# Patient Record
Sex: Female | Born: 1990
Health system: Southern US, Community
[De-identification: ages and names within clinical notes are randomized; demographics above are authoritative.]

## PROBLEM LIST (undated history)

## (undated) ENCOUNTER — Emergency Department (HOSPITAL_COMMUNITY): Admission: EM | Payer: MEDICAID | Source: Home / Self Care

## (undated) DIAGNOSIS — F410 Panic disorder [episodic paroxysmal anxiety] without agoraphobia: Secondary | ICD-10-CM

## (undated) DIAGNOSIS — F1110 Opioid abuse, uncomplicated: Secondary | ICD-10-CM

## (undated) DIAGNOSIS — K219 Gastro-esophageal reflux disease without esophagitis: Secondary | ICD-10-CM

## (undated) DIAGNOSIS — F429 Obsessive-compulsive disorder, unspecified: Secondary | ICD-10-CM

## (undated) DIAGNOSIS — K589 Irritable bowel syndrome without diarrhea: Secondary | ICD-10-CM

## (undated) DIAGNOSIS — Z8619 Personal history of other infectious and parasitic diseases: Secondary | ICD-10-CM

## (undated) DIAGNOSIS — K649 Unspecified hemorrhoids: Secondary | ICD-10-CM

## (undated) DIAGNOSIS — A749 Chlamydial infection, unspecified: Secondary | ICD-10-CM

## (undated) DIAGNOSIS — F319 Bipolar disorder, unspecified: Secondary | ICD-10-CM

## (undated) DIAGNOSIS — F4310 Post-traumatic stress disorder, unspecified: Secondary | ICD-10-CM

## (undated) DIAGNOSIS — F411 Generalized anxiety disorder: Secondary | ICD-10-CM

## (undated) DIAGNOSIS — F419 Anxiety disorder, unspecified: Secondary | ICD-10-CM

## (undated) DIAGNOSIS — G8929 Other chronic pain: Secondary | ICD-10-CM

## (undated) DIAGNOSIS — R102 Pelvic and perineal pain unspecified side: Secondary | ICD-10-CM

## (undated) DIAGNOSIS — F603 Borderline personality disorder: Secondary | ICD-10-CM

## (undated) DIAGNOSIS — F32A Depression, unspecified: Secondary | ICD-10-CM

## (undated) DIAGNOSIS — Z9151 Personal history of suicidal behavior: Secondary | ICD-10-CM

## (undated) DIAGNOSIS — K602 Anal fissure, unspecified: Secondary | ICD-10-CM

## (undated) DIAGNOSIS — K209 Esophagitis, unspecified without bleeding: Secondary | ICD-10-CM

## (undated) DIAGNOSIS — Z8659 Personal history of other mental and behavioral disorders: Secondary | ICD-10-CM

## (undated) DIAGNOSIS — F1910 Other psychoactive substance abuse, uncomplicated: Secondary | ICD-10-CM

## (undated) DIAGNOSIS — N809 Endometriosis, unspecified: Secondary | ICD-10-CM

## (undated) DIAGNOSIS — I10 Essential (primary) hypertension: Secondary | ICD-10-CM

## (undated) HISTORY — DX: Esophagitis, unspecified without bleeding: K20.90

## (undated) HISTORY — PX: FOOT FRACTURE SURGERY: SHX645

## (undated) HISTORY — DX: Bipolar disorder, unspecified: F31.9

## (undated) HISTORY — DX: Anal fissure, unspecified: K60.2

---
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
## 2009-02-19 ENCOUNTER — Emergency Department (HOSPITAL_COMMUNITY): Admission: EM | Admit: 2009-02-19 | Discharge: 2009-02-19 | Payer: Self-pay | Admitting: Emergency Medicine

## 2009-03-23 ENCOUNTER — Emergency Department (HOSPITAL_COMMUNITY): Admission: EM | Admit: 2009-03-23 | Discharge: 2009-03-23 | Payer: Self-pay | Admitting: Emergency Medicine

## 2009-11-28 DIAGNOSIS — K2950 Unspecified chronic gastritis without bleeding: Secondary | ICD-10-CM

## 2009-11-28 HISTORY — PX: UPPER GASTROINTESTINAL ENDOSCOPY: SHX188

## 2009-11-28 HISTORY — DX: Unspecified chronic gastritis without bleeding: K29.50

## 2010-07-10 ENCOUNTER — Emergency Department (HOSPITAL_COMMUNITY): Admission: EM | Admit: 2010-07-10 | Discharge: 2010-07-10 | Payer: Self-pay | Admitting: Emergency Medicine

## 2010-07-10 IMAGING — CT CT ABD-PELV W/ CM
2 of 4 series · 17 of 46 positions shown, 19 images · IV contrast (agent unspecified)
Comparison: [DATE]

CLINICAL DATA: Abdominal pain.  Vomiting.  Right lower quadrant
pain.  Question of appendicitis.

CT ABDOMEN AND PELVIS WITH CONTRAST
TECHNIQUE: Multidetector CT imaging of the abdomen and pelvis was
performed following the standard protocol during bolus
administration of intravenous contrast.
Contrast: 125 ml [DB]

[Series 2: abd_pel 5.0 b40f st · axial · 0.60mm/px · z∈[-524,-84]mm · 14 of 96 slices shown, 16 images]
[im 4/96  soft-tissue]
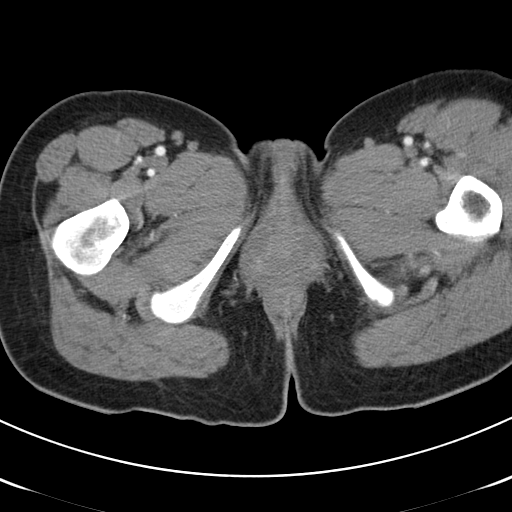
[im 4/96  bone]
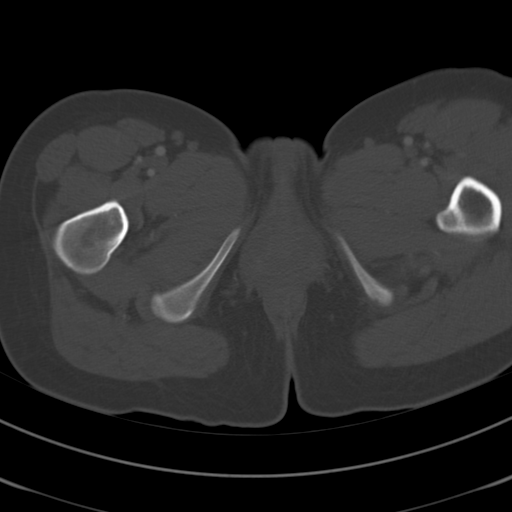
[im 12/96  soft-tissue]
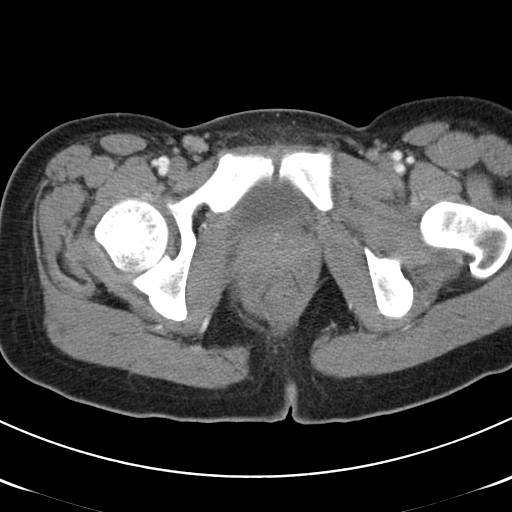
[im 20/96  soft-tissue]
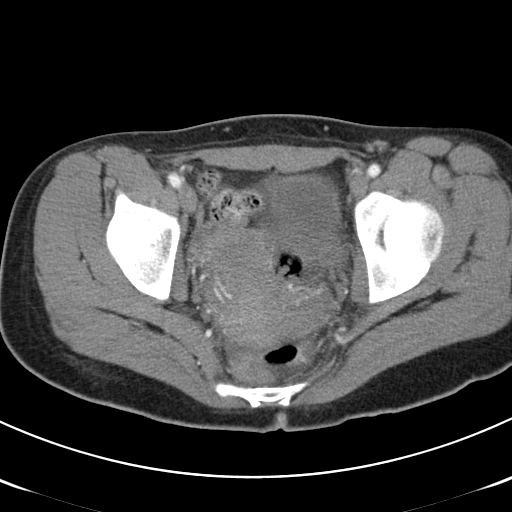
[im 24/96  soft-tissue]
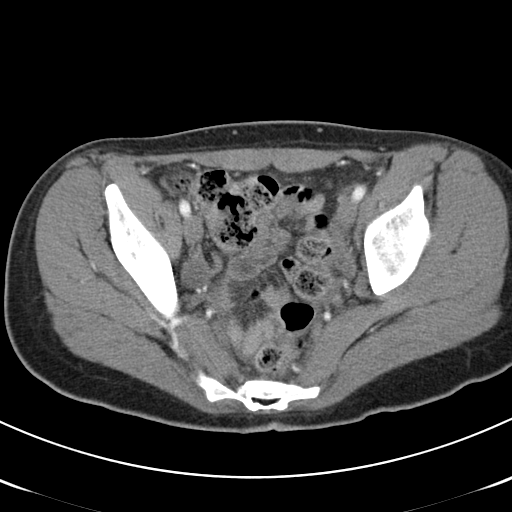
[im 32/96  soft-tissue]
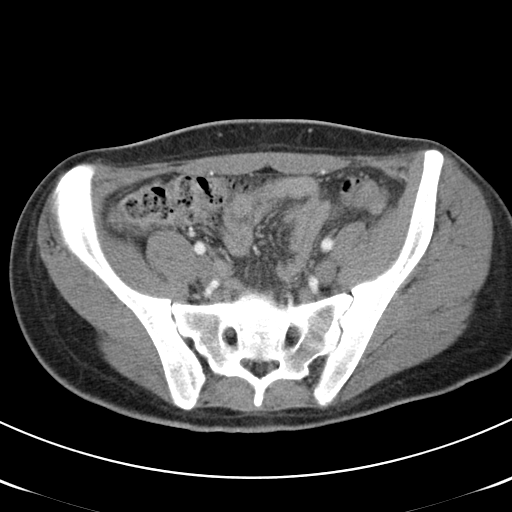
[im 40/96  soft-tissue]
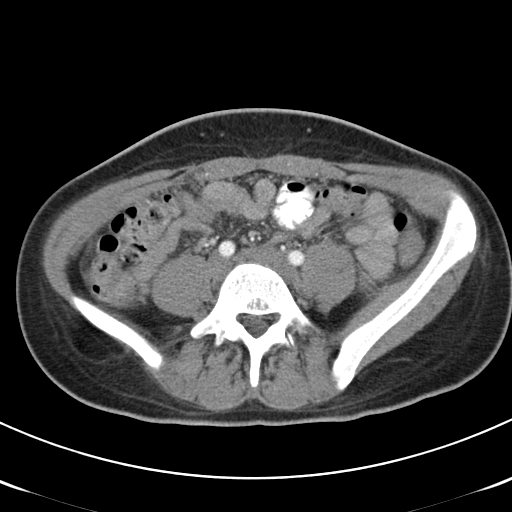
[im 44/96  soft-tissue]
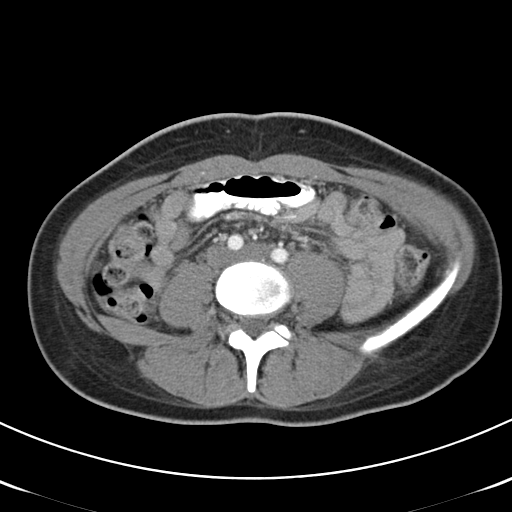
[im 52/96  soft-tissue]
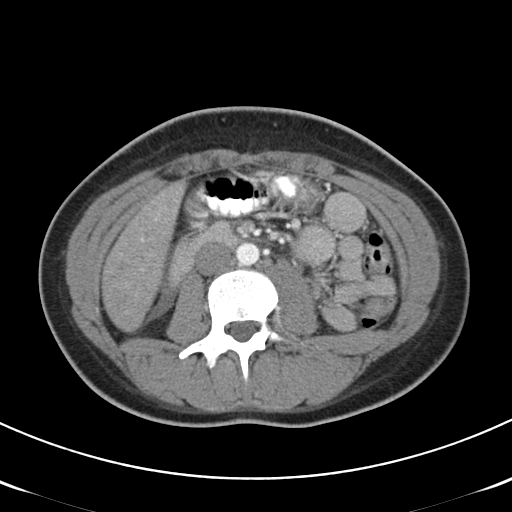
[im 56/96  soft-tissue]
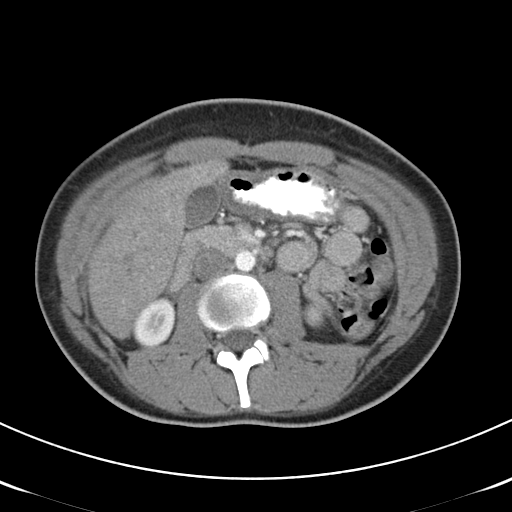
[im 56/96  bone]
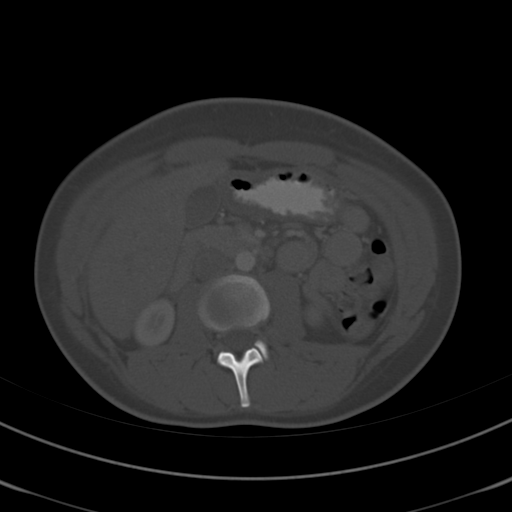
[im 64/96  soft-tissue]
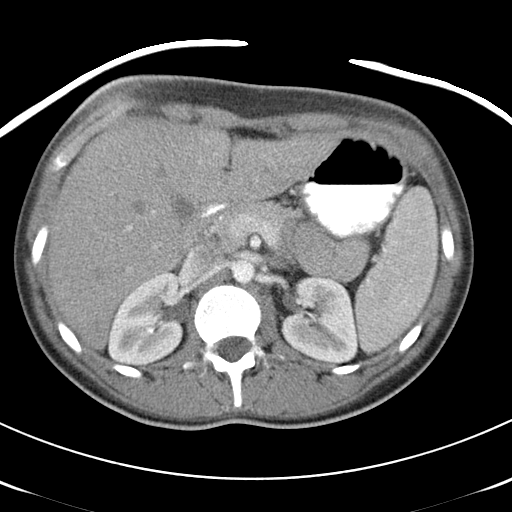
[im 72/96  soft-tissue]
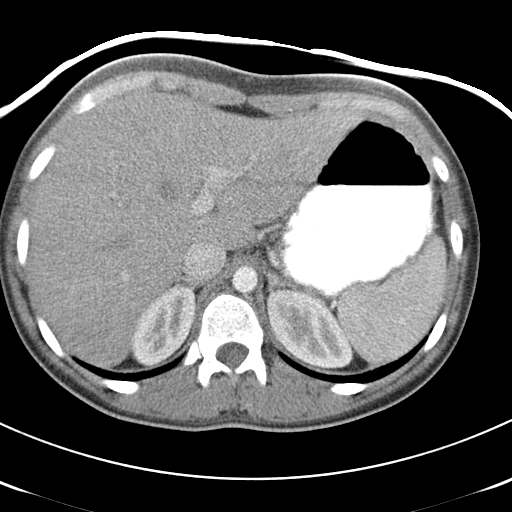
[im 76/96  soft-tissue]
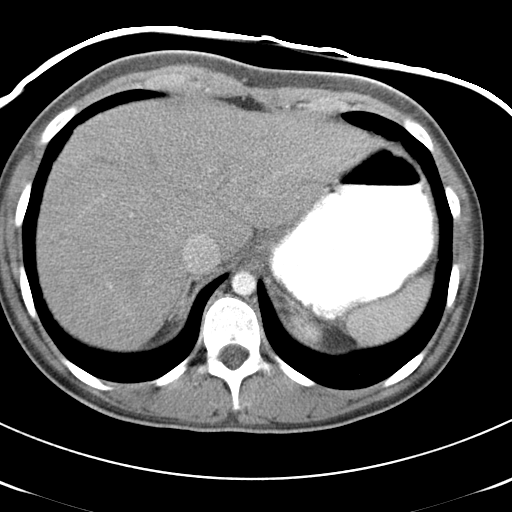
[im 84/96  soft-tissue]
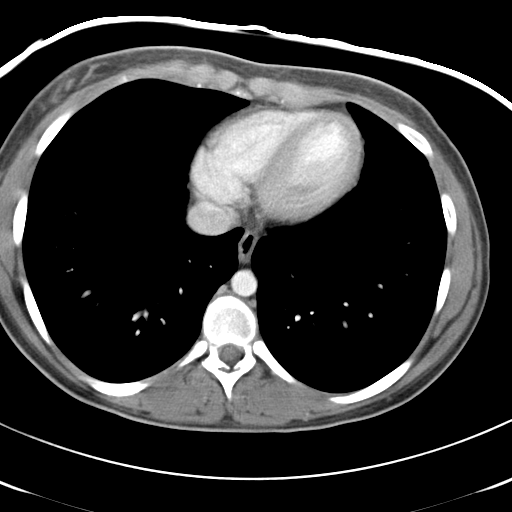
[im 92/96  soft-tissue]
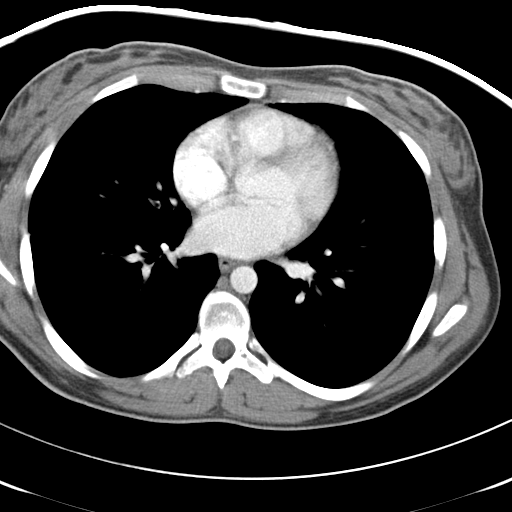

[Series 602: coronal · coronal · 0.97mm/px · 3 of 70 slices shown]
[im 24/70  soft-tissue]
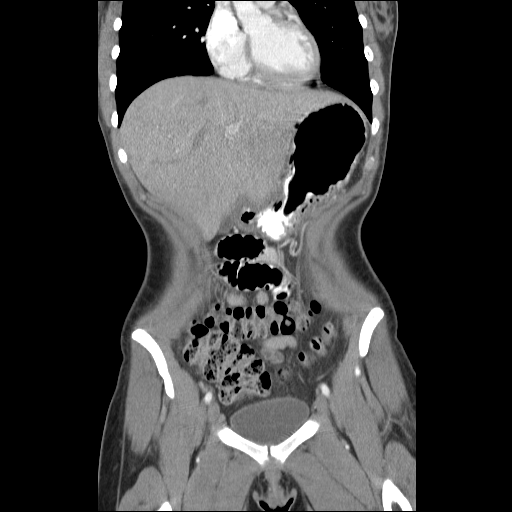
[im 31/70  soft-tissue]
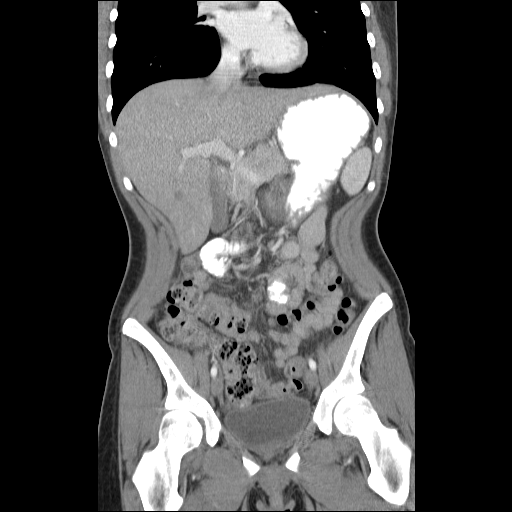
[im 39/70  soft-tissue]
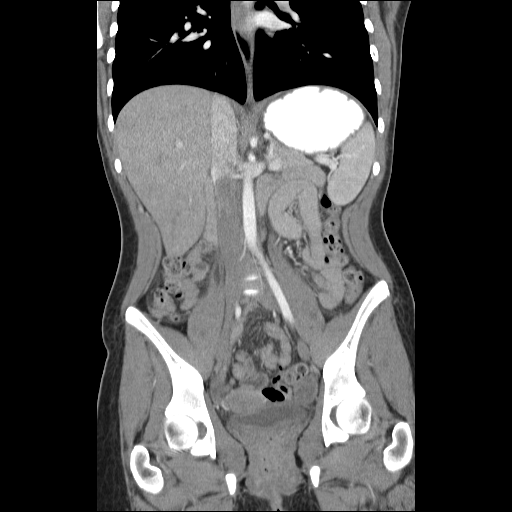

[17 of 46 positions shown; findings below may reference images not displayed]

FINDINGS: Within the right upper quadrant, there is a small amount
of retroperitoneal fluid.  This measures a low attenuation and is
seen posterior to the pancreatic head and duodenum, anterior to the
kidney.  There is a small amount stranding surrounding the
ascending colon.  No retroperitoneal gas identified.  The appendix
is well seen and has a normal appearance. No focal abnormalities
identified within the liver, spleen, pancreas, adrenal glands, or
kidneys.  The gallbladder is present.  No evidence for bowel
obstruction or bowel wall thickening.

The uterus is present.  There is a small amount free pelvic fluid.
No evidence for adnexal mass.  Bony structures have a normal
appearance.
IMPRESSION: 1.  Nonspecific retroperitoneal fluid within the right upper
quadrant.  Etiologies include the pancreas, duodenum, or ascending
colon.
2.  No evidence for acute appendicitis.

## 2010-10-17 ENCOUNTER — Emergency Department (HOSPITAL_BASED_OUTPATIENT_CLINIC_OR_DEPARTMENT_OTHER): Admission: EM | Admit: 2010-10-17 | Discharge: 2010-10-17 | Payer: Self-pay | Admitting: Emergency Medicine

## 2010-11-28 HISTORY — PX: UPPER GASTROINTESTINAL ENDOSCOPY: SHX188

## 2010-12-08 ENCOUNTER — Emergency Department (HOSPITAL_COMMUNITY)
Admission: EM | Admit: 2010-12-08 | Discharge: 2010-12-08 | Payer: Self-pay | Source: Home / Self Care | Admitting: Emergency Medicine
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
## 2010-12-09 ENCOUNTER — Emergency Department (HOSPITAL_COMMUNITY)
Admission: EM | Admit: 2010-12-09 | Discharge: 2010-12-09 | Payer: Self-pay | Source: Home / Self Care | Admitting: Emergency Medicine

## 2010-12-09 ENCOUNTER — Emergency Department (HOSPITAL_BASED_OUTPATIENT_CLINIC_OR_DEPARTMENT_OTHER)
Admission: EM | Admit: 2010-12-09 | Discharge: 2010-12-10 | Disposition: A | Discharge status: Other Acute Inpatient Hospital | Payer: Self-pay | Source: Home / Self Care | Admitting: Emergency Medicine

## 2010-12-09 IMAGING — CT CT ABD-PELV W/ CM
2 of 4 series · 17 of 46 positions shown, 19 images · IV contrast (APPLIED)
Comparison: Radiographs obtained yesterday.  CT dated [DATE].

CLINICAL DATA: Mid abdominal pain, nausea and vomiting.

CT ABDOMEN AND PELVIS WITH CONTRAST
TECHNIQUE: Multidetector CT imaging of the abdomen and pelvis was
performed following the standard protocol during bolus
administration of intravenous contrast.
Contrast: 100 ml [U2]

[Series 2: abd/pelvis 5.0 b31f · axial · 0.64mm/px · z∈[+965,+1345]mm · 14 of 84 slices shown, 16 images]
[im 4/84  soft-tissue]
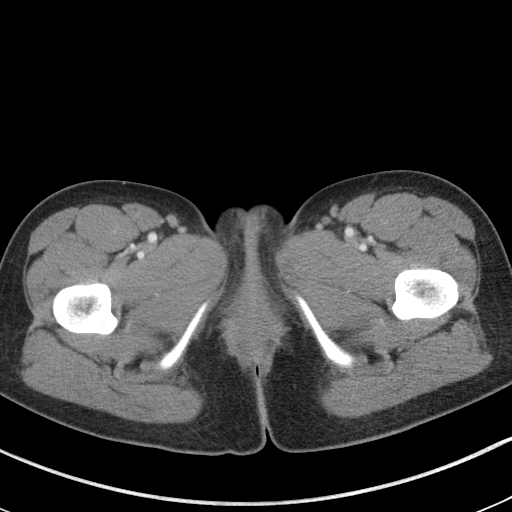
[im 4/84  bone]
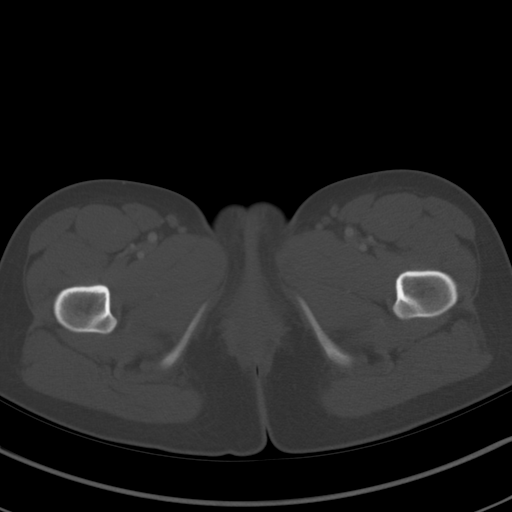
[im 11/84  soft-tissue]
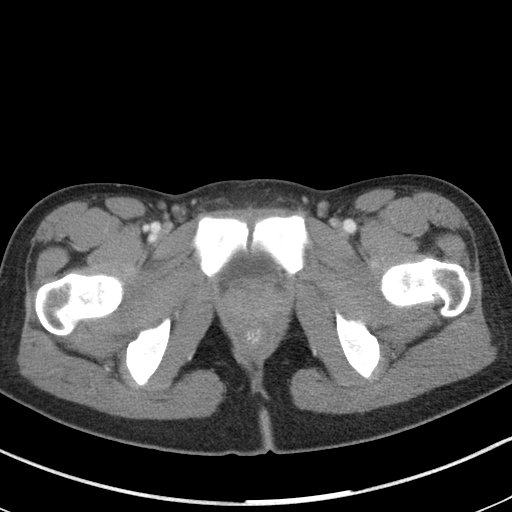
[im 15/84  soft-tissue]
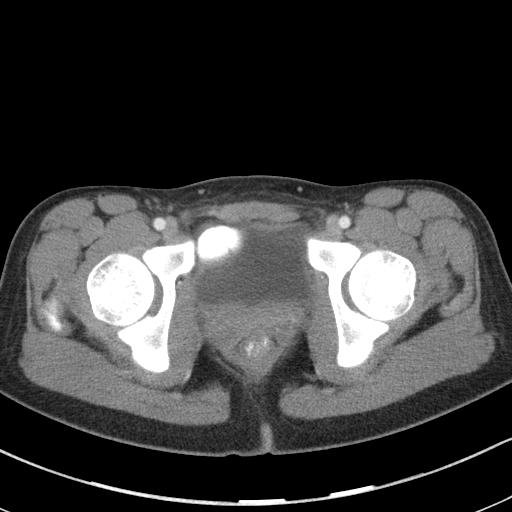
[im 22/84  soft-tissue]
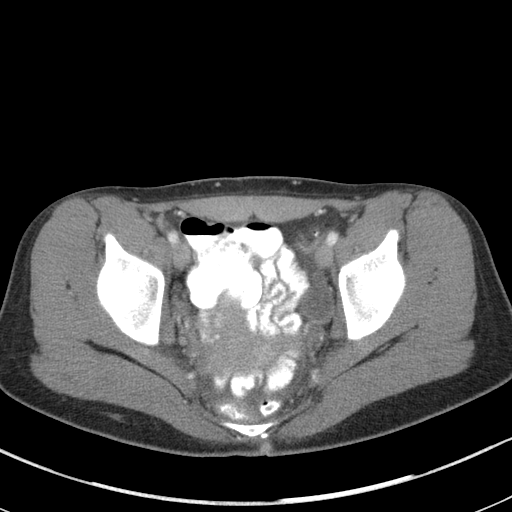
[im 29/84  soft-tissue]
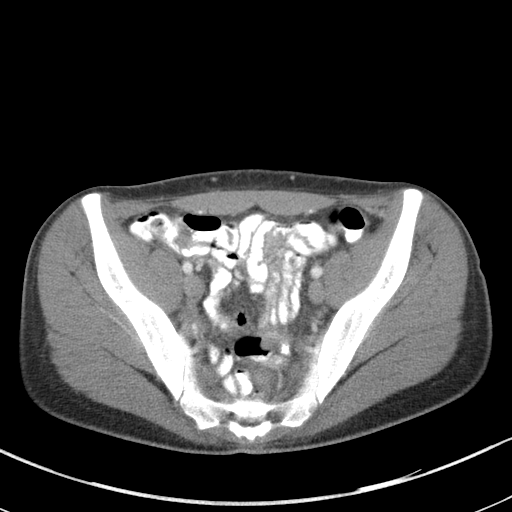
[im 33/84  soft-tissue]
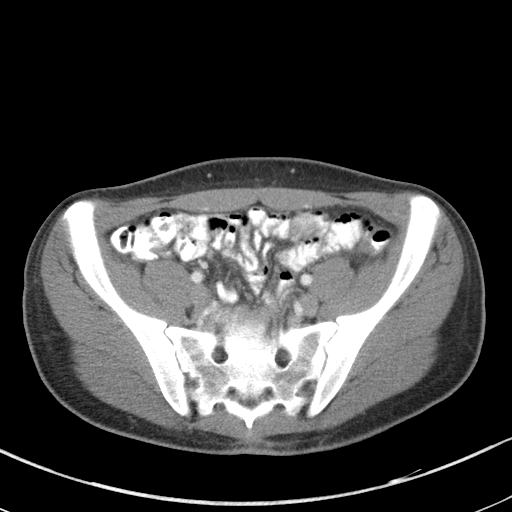
[im 40/84  soft-tissue]
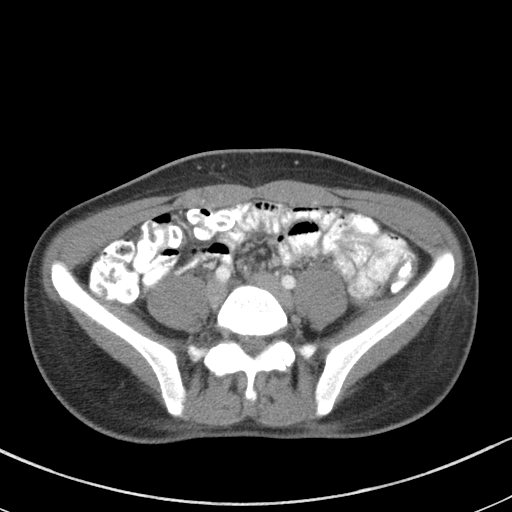
[im 44/84  soft-tissue]
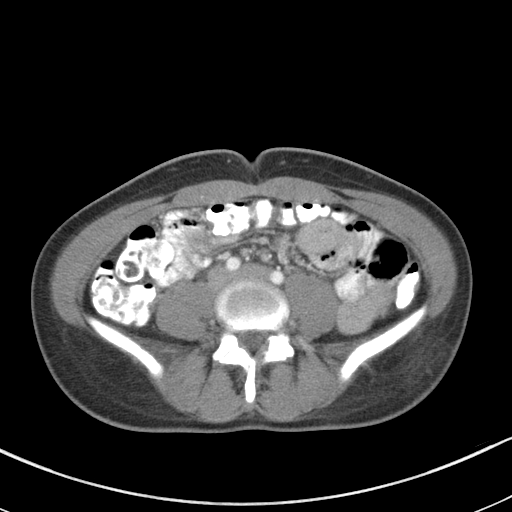
[im 51/84  soft-tissue]
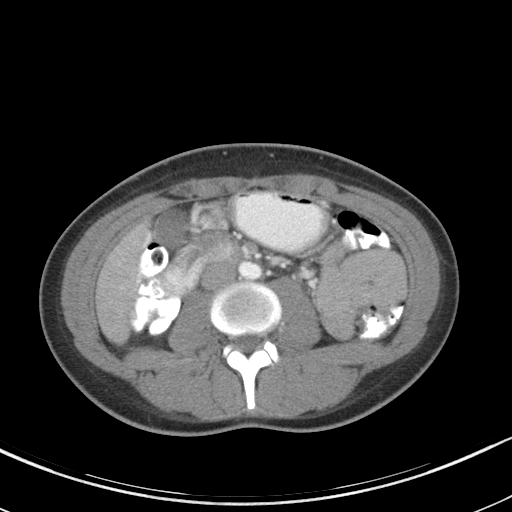
[im 51/84  bone]
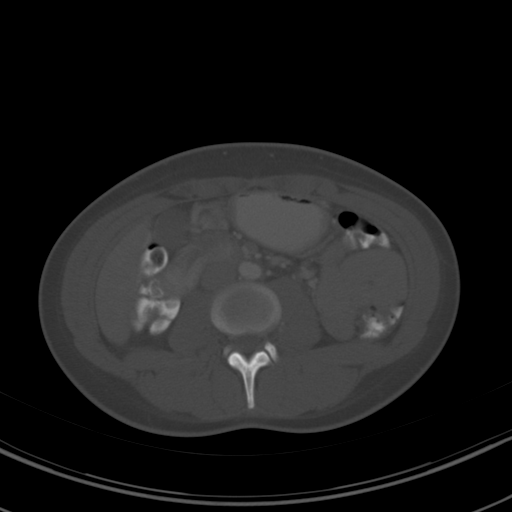
[im 55/84  soft-tissue]
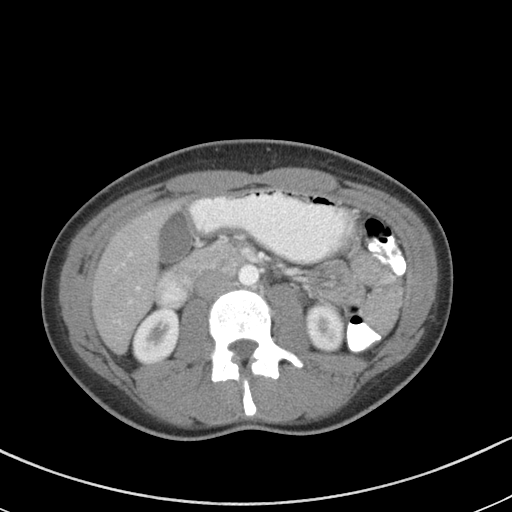
[im 62/84  soft-tissue]
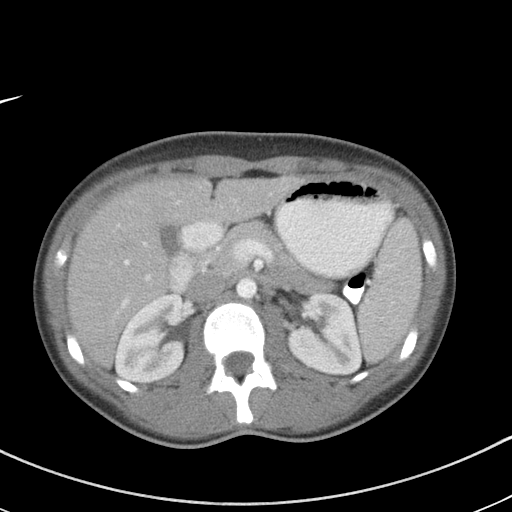
[im 69/84  soft-tissue]
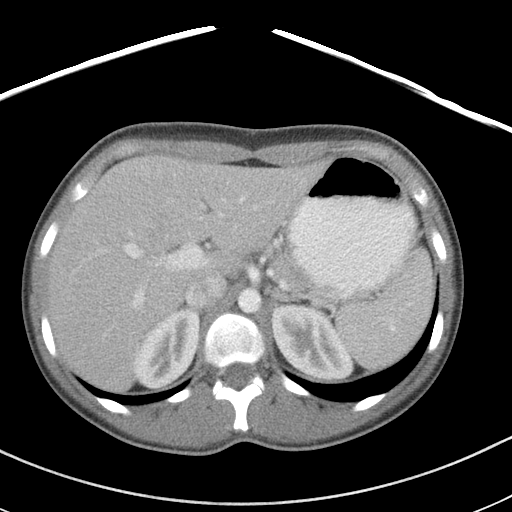
[im 73/84  soft-tissue]
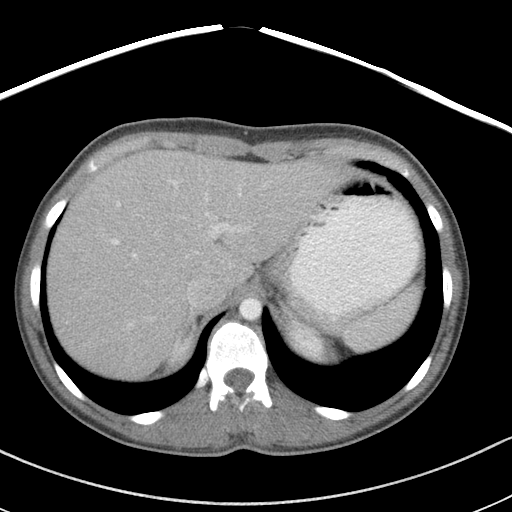
[im 80/84  soft-tissue]
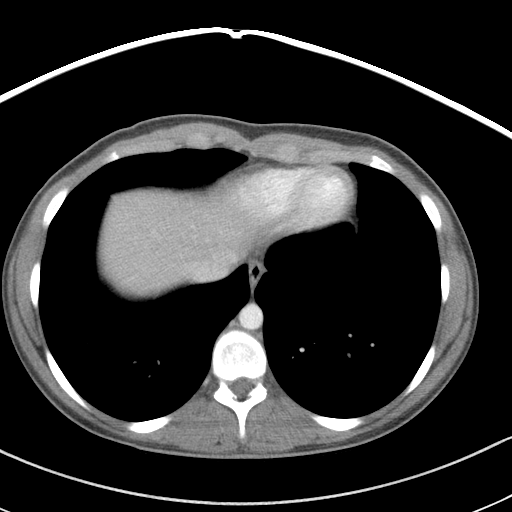

[Series 5: abd/pelvis 3.0 coronal · coronal · 0.69mm/px · 3 of 72 slices shown]
[im 24/72  soft-tissue]
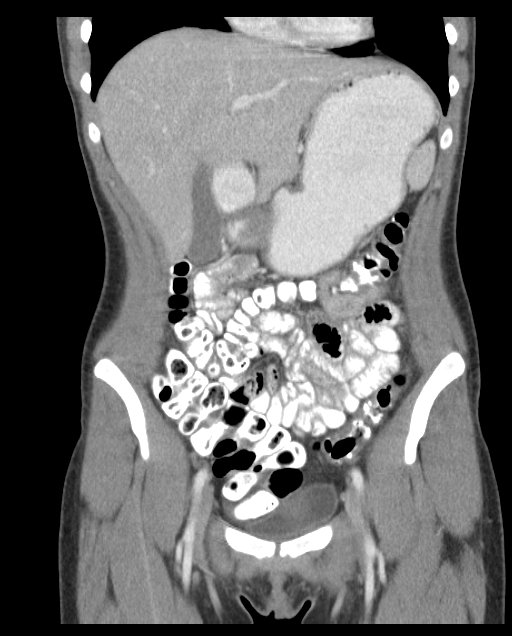
[im 32/72  soft-tissue]
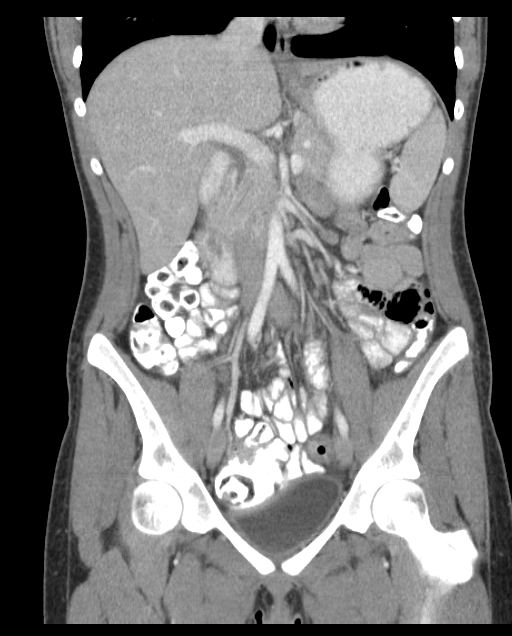
[im 40/72  soft-tissue]
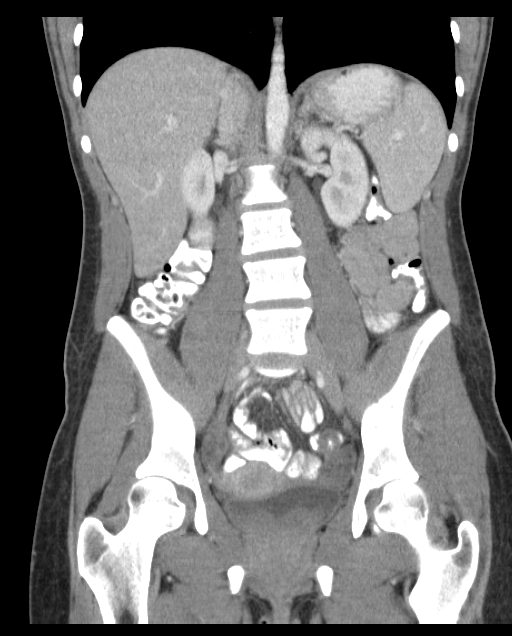

[17 of 46 positions shown; findings below may reference images not displayed]

FINDINGS: Unremarkable liver, spleen, pancreas, gallbladder,
adrenal glands, kidneys, urinary bladder, uterus and ovaries.  No
gastrointestinal abnormalities or enlarged lymph nodes.  Minimal
free peritoneal fluid, within normal limits of physiological fluid.
No evidence of appendicitis.  Clear lung bases.  Mild scoliosis.
Stable small lytic and sclerotic lesion in the right iliac bone,
with nonaggressive features.
IMPRESSION: No acute abnormality.

## 2010-12-10 ENCOUNTER — Observation Stay (HOSPITAL_COMMUNITY)
Admission: EM | Admit: 2010-12-10 | Discharge: 2010-12-11 | Payer: Self-pay | Attending: Internal Medicine | Admitting: Internal Medicine

## 2010-12-10 DIAGNOSIS — F4310 Post-traumatic stress disorder, unspecified: Secondary | ICD-10-CM
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
## 2010-12-13 ENCOUNTER — Emergency Department (HOSPITAL_COMMUNITY)
Admission: EM | Admit: 2010-12-13 | Discharge: 2010-12-13 | Payer: Self-pay | Source: Home / Self Care | Admitting: Emergency Medicine

## 2010-12-13 LAB — CBC
HCT: 46.7 % — ABNORMAL HIGH (ref 36.0–46.0)
HCT: 40.8 % (ref 36.0–46.0)
Hemoglobin: 15.8 g/dL — ABNORMAL HIGH (ref 12.0–15.0)
Hemoglobin: 14.1 g/dL (ref 12.0–15.0)
MCH: 31.2 pg (ref 26.0–34.0)
MCH: 29.9 pg (ref 26.0–34.0)
MCHC: 33.8 g/dL (ref 30.0–36.0)
MCHC: 34.6 g/dL (ref 30.0–36.0)
MCV: 92.1 fL (ref 78.0–100.0)
MCV: 86.4 fL (ref 78.0–100.0)
Platelets: 339 10*3/uL (ref 150–400)
Platelets: 283 10*3/uL (ref 150–400)
RBC: 5.07 MIL/uL (ref 3.87–5.11)
RBC: 4.72 MIL/uL (ref 3.87–5.11)
RDW: 12.6 % (ref 11.5–15.5)
RDW: 12.7 % (ref 11.5–15.5)
WBC: 14.9 10*3/uL — ABNORMAL HIGH (ref 4.0–10.5)
WBC: 10.5 10*3/uL (ref 4.0–10.5)

## 2010-12-13 LAB — URINALYSIS, ROUTINE W REFLEX MICROSCOPIC
Bilirubin Urine: NEGATIVE
Hgb urine dipstick: NEGATIVE
Ketones, ur: 15 mg/dL — AB
Ketones, ur: 15 mg/dL — AB
Leukocytes, UA: NEGATIVE
Nitrite: POSITIVE — AB
Nitrite: NEGATIVE
Protein, ur: 100 mg/dL — AB
Protein, ur: NEGATIVE mg/dL
Specific Gravity, Urine: 1.029 (ref 1.005–1.030)
Specific Gravity, Urine: 1.021 (ref 1.005–1.030)
Urine Glucose, Fasting: NEGATIVE mg/dL
Urine Glucose, Fasting: NEGATIVE mg/dL
Urobilinogen, UA: 1 mg/dL (ref 0.0–1.0)
Urobilinogen, UA: 0.2 mg/dL (ref 0.0–1.0)
pH: 7.5 (ref 5.0–8.0)
pH: 6 (ref 5.0–8.0)

## 2010-12-13 LAB — COMPREHENSIVE METABOLIC PANEL
ALT: 16 U/L (ref 0–35)
AST: 23 U/L (ref 0–37)
Albumin: 4.8 g/dL (ref 3.5–5.2)
Alkaline Phosphatase: 106 U/L (ref 39–117)
BUN: 9 mg/dL (ref 6–23)
CO2: 27 mEq/L (ref 19–32)
Calcium: 10.4 mg/dL (ref 8.4–10.5)
Chloride: 104 mEq/L (ref 96–112)
Creatinine, Ser: 0.89 mg/dL (ref 0.4–1.2)
GFR calc Af Amer: >60 mL/min (ref >60)
GFR calc non Af Amer: >60 mL/min (ref >60)
Glucose, Bld: 121 mg/dL — ABNORMAL HIGH (ref 70–99)
Potassium: 4.6 mEq/L (ref 3.5–5.1)
Sodium: 141 mEq/L (ref 135–145)
Total Bilirubin: 0.4 mg/dL (ref 0.3–1.2)
Total Protein: 8.6 g/dL — ABNORMAL HIGH (ref 6.0–8.3)

## 2010-12-13 LAB — BASIC METABOLIC PANEL
BUN: 9 mg/dL (ref 6–23)
CO2: 22 mEq/L (ref 19–32)
Calcium: 9.9 mg/dL (ref 8.4–10.5)
Chloride: 108 mEq/L (ref 96–112)
Creatinine, Ser: 0.8 mg/dL (ref 0.4–1.2)
GFR calc Af Amer: >60 mL/min (ref >60)
GFR calc non Af Amer: >60 mL/min (ref >60)
Glucose, Bld: 113 mg/dL — ABNORMAL HIGH (ref 70–99)
Potassium: 3.9 mEq/L (ref 3.5–5.1)
Sodium: 146 mEq/L — ABNORMAL HIGH (ref 135–145)

## 2010-12-13 LAB — DRUGS OF ABUSE SCREEN W/O ALC, ROUTINE URINE
Amphetamine Screen, Ur: NEGATIVE
Barbiturate Quant, Ur: NEGATIVE
Benzodiazepines.: NEGATIVE
Cocaine Metabolites: NEGATIVE
Creatinine,U: 56.7 mg/dL
Marijuana Metabolite: POSITIVE — AB
Methadone: NEGATIVE
Opiate Screen, Urine: NEGATIVE
Phencyclidine (PCP): NEGATIVE
Propoxyphene: NEGATIVE

## 2010-12-13 LAB — URINE MICROSCOPIC-ADD ON

## 2010-12-13 LAB — DIFFERENTIAL
Basophils Absolute: 0 10*3/uL (ref 0.0–0.1)
Basophils Absolute: 0 10*3/uL (ref 0.0–0.1)
Basophils Relative: 0 % (ref 0–1)
Basophils Relative: 0 % (ref 0–1)
Eosinophils Absolute: 0 10*3/uL (ref 0.0–0.7)
Eosinophils Absolute: 0 10*3/uL (ref 0.0–0.7)
Eosinophils Relative: 0 % (ref 0–5)
Eosinophils Relative: 0 % (ref 0–5)
Lymphocytes Relative: 11 % — ABNORMAL LOW (ref 12–46)
Lymphocytes Relative: 18 % (ref 12–46)
Lymphs Abs: 1.6 10*3/uL (ref 0.7–4.0)
Lymphs Abs: 1.9 10*3/uL (ref 0.7–4.0)
Monocytes Absolute: 0.5 10*3/uL (ref 0.1–1.0)
Monocytes Absolute: 0.4 10*3/uL (ref 0.1–1.0)
Monocytes Relative: 3 % (ref 3–12)
Monocytes Relative: 4 % (ref 3–12)
Neutro Abs: 12.8 10*3/uL — ABNORMAL HIGH (ref 1.7–7.7)
Neutro Abs: 8.1 10*3/uL — ABNORMAL HIGH (ref 1.7–7.7)
Neutrophils Relative %: 86 % — ABNORMAL HIGH (ref 43–77)
Neutrophils Relative %: 78 % — ABNORMAL HIGH (ref 43–77)

## 2010-12-13 LAB — PREGNANCY, URINE: Preg Test, Ur: NEGATIVE

## 2010-12-13 LAB — URINE CULTURE
Colony Count: NO GROWTH
Culture  Setup Time: 201201120131
Culture: NO GROWTH

## 2010-12-13 LAB — HIV ANTIBODY (ROUTINE TESTING W REFLEX): HIV: NONREACTIVE

## 2010-12-13 LAB — HEPATIC FUNCTION PANEL
ALT: 15 U/L (ref 0–35)
AST: 21 U/L (ref 0–37)
Albumin: 3.5 g/dL (ref 3.5–5.2)
Alkaline Phosphatase: 81 U/L (ref 39–117)
Bilirubin, Direct: <0.1 mg/dL (ref 0.0–0.3)
Total Bilirubin: 0.5 mg/dL (ref 0.3–1.2)
Total Protein: 6.1 g/dL (ref 6.0–8.3)

## 2010-12-13 LAB — TSH: TSH: 3.572 u[IU]/mL (ref 0.350–4.500)

## 2010-12-13 LAB — POCT PREGNANCY, URINE: Preg Test, Ur: NEGATIVE

## 2010-12-13 LAB — VITAMIN B12: Vitamin B-12: 211 pg/mL (ref 211–911)

## 2010-12-13 LAB — LIPASE, BLOOD
Lipase: 18 U/L (ref 11–59)
Lipase: 44 U/L (ref 23–300)

## 2010-12-15 LAB — POCT I-STAT, CHEM 8
BUN: 6 mg/dL (ref 6–23)
Calcium, Ion: 1.05 mmol/L — ABNORMAL LOW (ref 1.12–1.32)
Chloride: 104 mEq/L (ref 96–112)
Creatinine, Ser: 1 mg/dL (ref 0.4–1.2)
Glucose, Bld: 106 mg/dL — ABNORMAL HIGH (ref 70–99)
HCT: 45 % (ref 36.0–46.0)
Hemoglobin: 15.3 g/dL — ABNORMAL HIGH (ref 12.0–15.0)
Potassium: 3.4 mEq/L — ABNORMAL LOW (ref 3.5–5.1)
Sodium: 141 mEq/L (ref 135–145)
TCO2: 29 mmol/L (ref 0–100)

## 2010-12-15 LAB — FOLATE RBC: RBC Folate: 572 ng/mL (ref 180–600)

## 2010-12-20 LAB — THC (MARIJUANA), URINE, CONFIRMATION: Marijuana, Ur-Confirmation: 335 NG/ML — ABNORMAL HIGH

## 2011-02-08 LAB — DIFFERENTIAL
Eosinophils Relative: 0 % (ref 0–5)
Lymphocytes Relative: 14 % (ref 12–46)
Lymphs Abs: 1.6 10*3/uL (ref 0.7–4.0)
Monocytes Absolute: 0.5 10*3/uL (ref 0.1–1.0)
Monocytes Relative: 4 % (ref 3–12)

## 2011-02-08 LAB — COMPREHENSIVE METABOLIC PANEL
AST: 23 U/L (ref 0–37)
Albumin: 4.9 g/dL (ref 3.5–5.2)
Calcium: 10.2 mg/dL (ref 8.4–10.5)
Creatinine, Ser: 0.7 mg/dL (ref 0.4–1.2)
GFR calc Af Amer: >60 mL/min (ref >60)

## 2011-02-08 LAB — CBC
MCH: 31 pg (ref 26.0–34.0)
MCHC: 34.6 g/dL (ref 30.0–36.0)
Platelets: 289 10*3/uL (ref 150–400)
RBC: 4.74 MIL/uL (ref 3.87–5.11)

## 2011-02-08 LAB — URINALYSIS, ROUTINE W REFLEX MICROSCOPIC
Glucose, UA: NEGATIVE mg/dL
Hgb urine dipstick: NEGATIVE
Specific Gravity, Urine: 1.016 (ref 1.005–1.030)
Urobilinogen, UA: 0.2 mg/dL (ref 0.0–1.0)
pH: 8.5 — ABNORMAL HIGH (ref 5.0–8.0)

## 2011-02-08 LAB — POCT TOXICOLOGY PANEL

## 2011-02-11 LAB — URINALYSIS, ROUTINE W REFLEX MICROSCOPIC
Glucose, UA: NEGATIVE mg/dL
Ketones, ur: 40 mg/dL — AB
pH: 8.5 — ABNORMAL HIGH (ref 5.0–8.0)

## 2011-02-11 LAB — COMPREHENSIVE METABOLIC PANEL
ALT: 11 U/L (ref 0–35)
Alkaline Phosphatase: 86 U/L (ref 39–117)
CO2: 22 mEq/L (ref 19–32)
Chloride: 106 mEq/L (ref 96–112)
GFR calc non Af Amer: >60 mL/min (ref >60)
Glucose, Bld: 116 mg/dL — ABNORMAL HIGH (ref 70–99)
Potassium: 3.3 mEq/L — ABNORMAL LOW (ref 3.5–5.1)
Sodium: 141 mEq/L (ref 135–145)
Total Bilirubin: 0.2 mg/dL — ABNORMAL LOW (ref 0.3–1.2)

## 2011-02-11 LAB — DIFFERENTIAL
Basophils Relative: 0 % (ref 0–1)
Eosinophils Absolute: 0.1 10*3/uL (ref 0.0–0.7)
Neutrophils Relative %: 77 % (ref 43–77)

## 2011-02-11 LAB — CBC
HCT: 40 % (ref 36.0–46.0)
Hemoglobin: 14 g/dL (ref 12.0–15.0)
MCHC: 34.9 g/dL (ref 30.0–36.0)
WBC: 8.9 10*3/uL (ref 4.0–10.5)

## 2011-02-11 LAB — POCT PREGNANCY, URINE: Preg Test, Ur: NEGATIVE

## 2011-02-11 LAB — LIPASE, BLOOD: Lipase: 21 U/L (ref 11–59)

## 2011-02-11 LAB — GC/CHLAMYDIA PROBE AMP, GENITAL: Chlamydia, DNA Probe: NEGATIVE

## 2011-03-08 ENCOUNTER — Emergency Department (INDEPENDENT_AMBULATORY_CARE_PROVIDER_SITE_OTHER): Payer: No Typology Code available for payment source

## 2011-03-08 ENCOUNTER — Emergency Department (HOSPITAL_BASED_OUTPATIENT_CLINIC_OR_DEPARTMENT_OTHER)
Admission: EM | Admit: 2011-03-08 | Discharge: 2011-03-08 | Disposition: A | Discharge status: Home / Self Care | Payer: No Typology Code available for payment source | Attending: Emergency Medicine | Admitting: Emergency Medicine

## 2011-03-08 DIAGNOSIS — J45909 Unspecified asthma, uncomplicated: Secondary | ICD-10-CM | POA: Insufficient documentation

## 2011-03-08 DIAGNOSIS — M542 Cervicalgia: Secondary | ICD-10-CM

## 2011-03-08 DIAGNOSIS — S139XXA Sprain of joints and ligaments of unspecified parts of neck, initial encounter: Secondary | ICD-10-CM | POA: Insufficient documentation

## 2011-03-08 DIAGNOSIS — Y92410 Unspecified street and highway as the place of occurrence of the external cause: Secondary | ICD-10-CM | POA: Insufficient documentation
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
## 2011-04-15 NOTE — H&P (Signed)
Gina Dorsey, Gina Dorsey               ACCOUNT NO.:  [id number]   MEDICAL RECORD NO.:  [id number]          PATIENT TYPE:  INP   LOCATION:  0102                          [facility]  [facility]   PHYSICIAN:  Lalla Brothers, MDDATE OF BIRTH:  23-Aug-1991   DATE OF ADMISSION:  02/26/2007  DATE OF DISCHARGE:                       PSYCHIATRIC ADMISSION ASSESSMENT   IDENTIFICATION:  This 85-1/20-year-old female, 10th grade student at  [employer], is admitted emergently voluntarily in transfer  from [facility] Emergency Department where she was sent by  school counselor after school security stopped her attempt to run away.  The patient has suicidal ideation with a plan to overdose, hang herself,  or cut her throat to suicide.  The patient has a history of self-  cutting.  She is residing primarily with father who mother states  provides no boundaries or discipline while the patient fights with  mother when mother provides these such as two months ago.  Most acutely,  mother has intercepted computer communication of the patient in which  she appeared to be selling herself sexually for drugs.   HISTORY OF PRESENT ILLNESS:  The patient enters the hospital very  dysphoric and resigned to suffering while, the next morning, she is  assertive and minimizing her symptoms and consequences.  The patient is  highly defended but seems to have limited interpersonal and intrapsychic  defenses relative to environmental and interpersonal conflict or stress.  The patient cries uncontrollably at times and then gets angry and  demanding.  Mother notes that the patient has sudden switches in her  mood, attitude and cognitive posture.  The patient will seem empathetic  and caring at brief times but then overwhelming subsequently.  The  patient reports hating herself and others.  She reports occasional panic  including on the day of admission.  It is difficult to discern whether  the patient may be  manipulating for certain medication or is directly  sincere about her current status.  Mother confirms the patient's report  that she had stimulant pharmacotherapy including Concerta, Adderall and  Strattera when much younger.  Subsequently, the patient was treated with  Depakote and Zoloft, experiencing a 30-pound weight gain but some  definite improvement.  Mother indicates the patient went into a  [address] and, after months, was released from the camp off of  medications as they attempted to taper and discontinue whatever they  could.  Mother indicates the patient did okay for awhile but then has  relapsed.  She last saw Dr. Carlus Pavlov, Ph.D. six months ago.  His  diagnoses had been the ADHD, cyclothymic disorder and ODD.  The patient  considers Zoloft to have caused a 30-pound weight gain.  She is  generally opposed to additional medication though she is self-medicating  with alcohol and drugs.  She drinks a significant amount of beer on the  weekend, reporting drinking weekly to intoxication with last use being  February 24, 2007.  The patient uses cannabis up to six joints daily for  the last year with the last use being the day before admission.  She  smokes one pack per day of cigarettes.  She has used triple C's up to 10  at a time with the last use being two weeks ago.  She has stolen  hydrocodone from mother.  She is living predominately with father and  fights with mother.  Mother notes that all are concerned about her  sudden shifts in mood and behavior.  The patient has begun prostitution  for drugs or money to obtain drugs though she will not discuss this.  She has a lesbian relationship with a drug addict from school and the  patient validates her academic failure of two years in that way.  At the  same time, the patient seems angry and mother states that sometimes the  patient seems clear in her responsibility and her conscience.  There is  no definite organicity  but she does have chronic ADHD.  She has  significant conduct disorder superceding from previous oppositional  defiant disorder.  She has no definite psychosis.   PAST MEDICAL HISTORY:  The patient is under the primary care of Dr.  Abigail Miyamoto at [facility].  She has allergic rhinitis and asthma.  She  currently has a contusion with ecchymosis on the right arm and abrasions  on both arms and the right forearm as well as the back.  Last menses was  March of 2008.  She is sexually active including now, communicating  unexpectedly to mother that she is selling sex for drugs.  Last menses  was March of 2008.  She indicates she has been sexually active.  Parents  have considered the patient advanced in development.  She has no  medication allergies.  She is taking with Lutera birth control pill  every noon.  She takes Zyrtec 10 mg every morning, own supply.  She has  had no seizure or syncope.  She has had no heart murmur or arrhythmia.   REVIEW OF SYSTEMS:  The patient denies difficulty with gait, gaze or  continence.  She denies exposure to communicable disease or toxins other  than in her partially admitted sexual activity for drugs.  She denies  rash, jaundice or purpura.  She has no headache or sensory loss  currently.  She has no memory loss or coordination deficit.  She has no  cough, congestion, chest pain, palpitations or presyncope.  She has no  abdominal pain, nausea, vomiting or diarrhea.  There is no dysuria or  arthralgia.   IMMUNIZATIONS:  Up-to-date.   FAMILY HISTORY:  The patient is living with father who mother states  refuses to provide discipline and boundaries.  Father refuses to support  these from mother as well.  Parents are divorced but have joint custody.  The patient fought with mother two months ago.  Mother found a text  message about the patient selling sex for drugs.  The patient has had  shoplifting and stealing from parents.  SOCIAL AND DEVELOPMENTAL  HISTORY:  The patient is a 10th grade student  at [employer].  The patient reports that she has been failing  in school the last two years.  The patient states at times that she does  not do any of her work and does not care.  At other times, the patient  seems remorseful and then wants to die.  The patient has practiced  witchcraft in the past and reports dating a girlfriend who is a  satanist.  She has had shoplifting and stealing from parents.  She  denies current legal charges.  However, she uses one pack per day of  cigarettes, daily cannabis, weekly alcohol to intoxication and triple  C's or hydrocodone when she can get it.  Urine drug screen is positive  for THC in the emergency department.  She smokes one pack per day of  cigarettes.   ASSETS:  The patient is social even though reporting hatred of many  people and things.   MENTAL STATUS EXAM:  Height is 64 inches and weight is 54.5 kg.  Blood  pressure is 119/78 with heart rate of 88 (sitting) and 133/81 with heart  rate of 101 (standing).  She is right-handed.  The patient is alert and  oriented with speech intact.  Cranial nerves 2-12 are intact.  Muscle  strengths and tone are normal.  There are no pathologic reflexes or soft  neurologic findings.  There are no abnormal involuntary movements.  Gait  and gaze are intact.  The patient is superficial in her forceful  verbalization that she is failing school and that nothing has helped  her.  She becomes irritable and angry at times.  The patient has no  consistency and has frequent inattention and impulsivity.  The patient  minimizes the significance of lack of family containment and personal  boundaries.  She similarly minimizes her motivation to change.  She  represses and suppresses in a way that results in cumulative sobbing and  despair to die.  She does not acknowledge overt hallucinations but seems  confused and at least predelusional.  She has significant  suicidality,  both acutely and cumulative.   IMPRESSION:  AXIS I:  Mood disorder not otherwise specified.  Attention-  deficit hyperactivity disorder, combined-type, moderate to severe.  Conduct disorder, adolescent onset.  Cannabis dependence.  Psychoactive  substance abuse not otherwise specified.  Parent-child problem.  Other  specified family circumstances.  Noncompliance with treatment.  AXIS II:  Diagnosis deferred.  AXIS III:  Multiple contusions and abrasions, prostitution-type  environmental exposure, birth control pills, allergic rhinitis and  asthma, cigarette smoking.  AXIS IV:  Stressors:  Family--severe, acute and chronic; school--severe  to extreme, acute and chronic; phase of life--severe to extreme, acute  and chronic.  AXIS V:  GAF on admission 30; highest in last year estimated at 57.   PLAN:  Patient is admitted for inpatient adolescent psychiatric and  multidisciplinary multimodal behavioral health treatment in a team-based program at a locked psychiatric unit.  Wellbutrin pharmacotherapy is  discussed with mother along with as-needed Vistaril.  Possible need for  a substitute for Depakote from the past is discussed with mother  particularly referable to sudden personality change and aggression and  becomes suicidal that mother considers somewhat out of character but  more frequently present over time.  Substance abuse intervention,  cognitive behavioral therapy, anger management, social and communication  skills, problem-solving and coping skills, family intervention, identity  consolidation, individuation separation and learning based strategies  can be undertaken.   ESTIMATED LENGTH OF STAY:  Seven to nine days with target symptom for  discharge being stabilization of suicide risk and mood, stabilization of  dangerous, disruptive behavior, and generalization of the capacity for  safe, effective participation in subsequent aftercare and public   schooling.      Lalla Brothers, MD  Electronically Signed     GEJ/MEDQ  D:  02/27/2007  T:  02/27/2007  Job:  [id number]

## 2011-04-15 NOTE — Discharge Summary (Signed)
NAMEMCKINSEY, Gina Dorsey               ACCOUNT NO.:  [id number]   MEDICAL RECORD NO.:  [id number]          PATIENT TYPE:  INP   LOCATION:  0102                          [facility]ILITY:  [facility]   PHYSICIAN:  Lalla Brothers, MDDATE OF BIRTH:  28-Jun-1991   DATE OF ADMISSION:  02/26/2007  DATE OF DISCHARGE:  03/04/2007                               DISCHARGE SUMMARY   IDENTIFICATION:  A 20-1/21-year-old female tenth grade student at  [employer] was admitted emergently voluntarily in transfer  from [facility] Emergency Department where she was sent by  school counselor after school Security stopped her attempt to run away  from the school grounds.  The patient reported suicide plan to overdose,  hang herself or cut her throat, having a history of self cutting in the  past.  The patient has been residing primarily with father who mother  perceives to attempt to keep the peace with the patient and not  providing boundaries or discipline.  The patient had a fight with mother  2 months ago as mother attempted to advance expectations and boundaries.  The family had become most sensitized to the patient's difficulty when  intercepted text message communication on her cell phone suggesting that  she was involved in selling sex for drugs.  For full details please see  the typed admission assessment.   SYNOPSIS OF PRESENT ILLNESS:  The patient was unkempt with significant  abrasions on the back of her arms and back.  The patient reported hating  herself and others as well.  She would cry, uncontrollably at times and  report panic though objectively she appeared to have too little anxiety.  She had a previous diagnosis of cyclothymic disorder, ADHD, and ODD.  The patient considers Zoloft to have caused a 30-pound weight gain in  the past but speaks favorably of Depakote from the past.  Mother  indicates that the patient's wilderness camp treatment program weaned  her off all medications  and she did well for awhile without medications.  The patient is now admitted becoming significantly depressed again with  concern about mood cycling or swings contributing to her current  involvement in a lesbian relationship surrounding drug use while  also  having a relationship with a female similarly maintaining that she is  bisexual.  She reports not doing well with stimulants in the past though  she is failing consistently the last 2 years at [employer].  She smokes  one-pack per day of cigarettes, uses up to 10 triple C that time, has  stolen hydrocodone from mother, drinks alcohol to intoxication on the  weekends and uses up to six joints of cannabis daily for the last year.  The patient has been refusing therapy and had benefited most in the past  from 2 years in wilderness camp.  The last legal charge was shoplifting  in October 2007 receiving community service sentencing.  There is family  history of substance abuse with alcohol, diabetes, hypertension and  cancer.   INITIAL MENTAL STATUS EXAM:  The patient did not manifest anxiety but  rather forceful verbalization  with irritability and angry control of  others.  She minimizes motivation to change with repression and  suppression until she has unexpected sudden sobbing and despair to die.  However, she maintains that she will not change her lifestyle the to the  point that she becomes confused about how and why she is doing these  things without frank delusion or psychosis.  Her acting out has reached  the point of having no remorse or empathy for others equivalent to  conduct disorder.   LABORATORY FINDINGS:  In the Emergency Department, venous blood gas  revealed pH slightly elevated at 7.324 and bicarbonate 25.7.  Urine  pregnancy test was negative in the Emergency Department and urine drug  screen was positive for tetrahydrocannabinol.  Her urinalysis revealed  small amount leukocyte esterase, few epithelial, 3-6 WBC with  few  bacteria and mucus present.  At the [facility], white  count was slightly elevated at 11,200 with upper limit of normal 10,000  with hemoglobin normal at 13.4, MCV of 86 and platelet count 311,000.  Comprehensive metabolic panel was normal with sodium 137, potassium 4.4,  random glucose 96, creatinine 0.7, calcium 9.4, albumin 3.7, AST 18, ALT  13 and GGT 16.  Free T4 was normal at 0.96 and TSH at 2.689.  Urine HCG  at the [facility] was negative as well.  Urine probe for  gonorrhea and chlamydia trichomatous by DNA amplification were both  negative.   HOSPITAL COURSE AND TREATMENT:  General medical exam by Jorje Guild PA-C  noted no medication allergies.  She has a history of allergic rhinitis  and asthma taking Zyrtec but having no asthma for the last 2 years.  She  is on with [place] birth control pill daily and smokes one-pack per day  of cigarettes for the last year.  She reports a 16-pound weight loss in  the last 3 months having some night sweats.  She reports frequent  vomiting when drinking alcohol.  She had menarche at age 20 with  irregular menses and BMI was 20.6.  Last GYN exam for sexual activity  was at [facility] 5 months ago and she was re-educated on health  maintenance.   Admission height was 64 inches with weight of 54.5 kg and discharge  weight was 53.5 kg.  Initial blood pressure was 120/61 with heart rate  of 69 supine and 113/71 with heart rate of 104 standing.  At the time of  discharge, supine blood pressure was 115/67 with heart rate of 62 and  standing blood pressure 118/72 with heart rate of 74.  The patient  received a starting dose of 150 mg XL Wellbutrin taken in the mid  afternoon and she did not eat supper.  She vomited and had diarrhea  several times until just after midnight.  She refused to take further  Wellbutrin.  She was switched to Cymbalta and tolerated the 30 mg of Cymbalta with only minimal side effects  initially that resolved after a  couple of days.  They declined to add Depakote that had initially been  scheduled for the second night after starting Cymbalta.  The patient and  family were back and forth and interpreting how to manage the patient's  out of control behavior, mood symptoms, and substance abuse.  With  experiences of their past, they consolidated that the patient's  devaluation of family and alienation of treatment would not be  reinforced but rather they would proceed with establishing expectations  and following through with consequences.  The patient was dismissed from  [employer] by the school during the patient's hospital stay.  Family and patient were initially traumatized and stressed by that  consequence but then worked through [employer] motivation for the patient to  attend [employer] for the remainder of her education.  The patient became  motivated and then the family became motivated.  They did improve  communication and relations. She was discharged in improved condition,  resolving her suicidal ideation instead of continuing to make threats of  becoming suicidal if not given her way by the family.  Substance abuse  consultation was provided by Bernadene Bell with conclusions of  polysubstance abuse and cannabis dependence.  All elements of outpatient  treatment need were integrated. The patient was discharged home to  father free of suicidal ideation.  The patient required no seclusion or  restraint during hospital stay.   FINAL DIAGNOSES:  AXIS I:  1. Cyclothymic disorder.  2. Attention deficit hyperactivity disorder, combined type, moderate      severity.  3. Conduct disorder, adolescent onset.  4. Cannabis dependence.  5. Polysubstance abuse.  6. Parent child problem.  7. Other interpersonal problem.  8. Other specified family circumstances.  9. Noncompliance with treatment.  AXIS II:  Diagnosis deferred.  AXIS III:  1. Multiple contusions and  abrasions.  2. Birth control pills.  3. Allergic rhinitis and asthma.  4. Cigarette smoking.  AXIS IV: Stressors, family severe acute and chronic; school severe to  extreme acute and chronic; phase of life severe to extreme acute and  chronic.  AXIS V: GAF on admission 30 with highest in last year estimated 57 and  discharge GAF was 53.   PLAN:  The patient was discharged on a regular diet having no  restrictions on physical activity other than to remain sober and to  abstain from self injury.  Crisis and safety plans are outlined if  needed.   MEDICATIONS:  She is prescribed the following medication:.  1. Cymbalta 30 mg capsule every morning quantity #30 with one refill      written.  2. Zyrtec 10 mg every morning own home supply.  3. Lutera birth control pill daily at noon own home supply.   Family prefers to organize aftercare through intake appointment with  Carlus Pavlov Ph.D. at [facility] 03/07/[facility] at 1600, [phone].  Medication management options can be  integrated with substance abuse treatment options.  The patient will be  attending school at [employer].  Depakote can be added if family becomes  willing likely as a single bedtime dose of the ER formulation.  They are  educated on the side effects, risks and  proper use of the Cymbalta  including FDA guidelines and warnings.      Lalla Brothers, MD  Electronically Signed     GEJ/MEDQ  D:  03/08/2007  T:  03/09/2007  Job:  [id number]   cc:   Carlus Pavlov PhD  [facility]  [address]  [address]  [place], [place] [address]

## 2011-06-17 ENCOUNTER — Emergency Department (HOSPITAL_BASED_OUTPATIENT_CLINIC_OR_DEPARTMENT_OTHER)
Admission: EM | Admit: 2011-06-17 | Discharge: 2011-06-18 | Disposition: A | Discharge status: Home / Self Care | Payer: 59 | Attending: Emergency Medicine | Admitting: Emergency Medicine

## 2011-06-17 ENCOUNTER — Encounter: Payer: Self-pay | Admitting: *Deleted

## 2011-06-17 ENCOUNTER — Emergency Department (INDEPENDENT_AMBULATORY_CARE_PROVIDER_SITE_OTHER): Payer: 59

## 2011-06-17 DIAGNOSIS — K5900 Constipation, unspecified: Secondary | ICD-10-CM | POA: Insufficient documentation

## 2011-06-17 DIAGNOSIS — R109 Unspecified abdominal pain: Secondary | ICD-10-CM

## 2011-06-17 IMAGING — CR DG ABDOMEN ACUTE W/ 1V CHEST
3 series · 3 of 3 positions shown · non-contrast
Comparison: CT [DATE]

CLINICAL DATA: Pain and constipation

ACUTE ABDOMEN SERIES (ABDOMEN 2 VIEW & CHEST 1 VIEW)

[w chest pa]
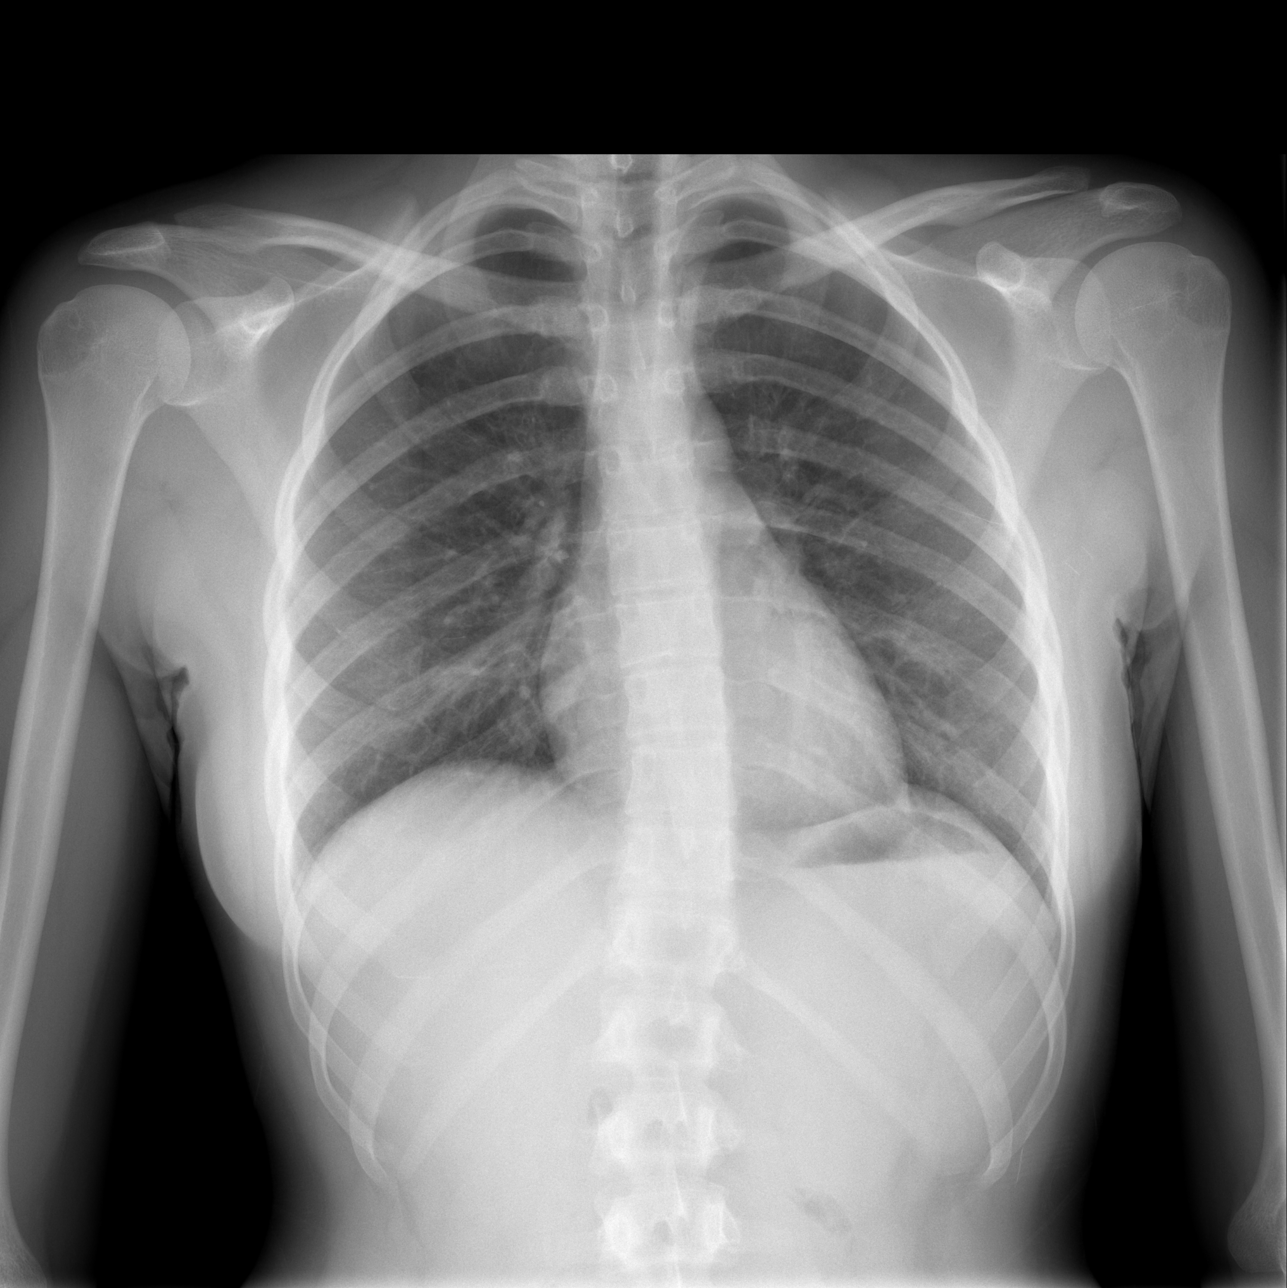

[w abdomen upright]
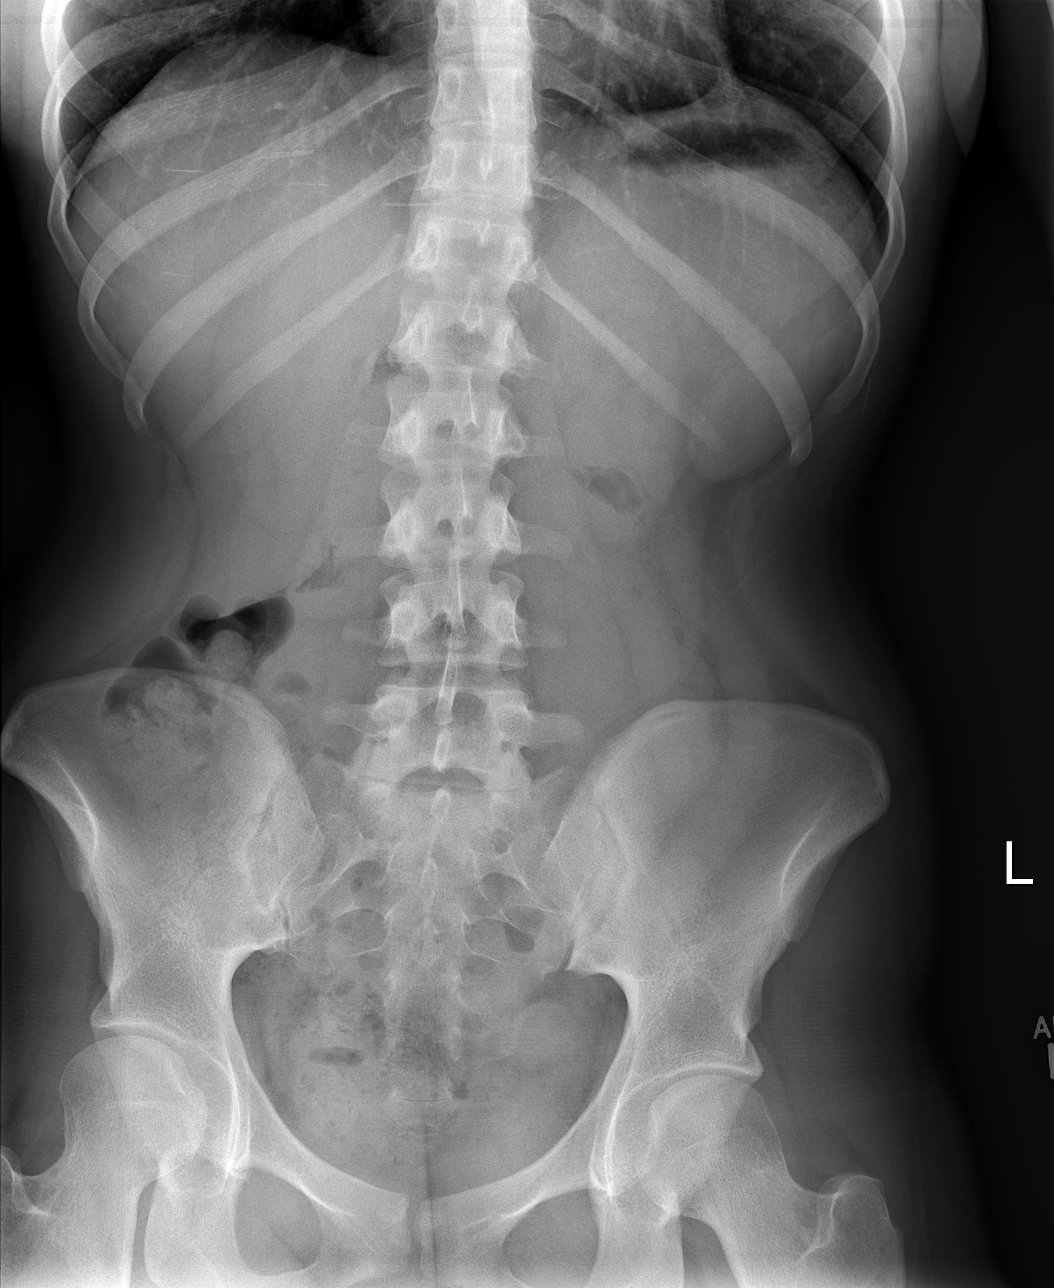

[t abdomen supine]
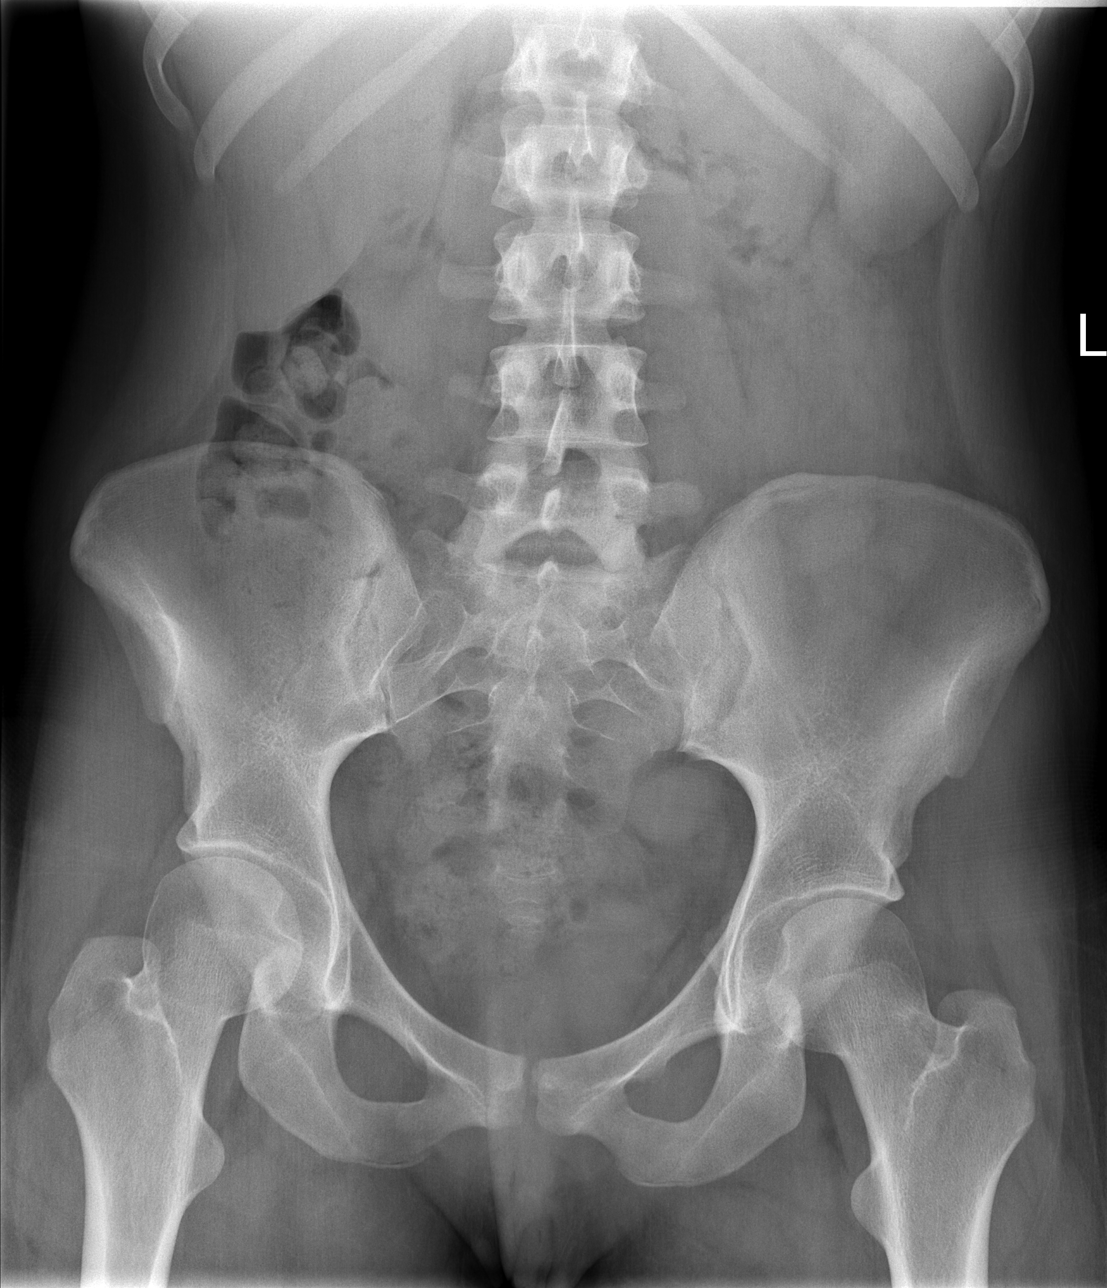

[3 of 3 positions shown; findings below may reference images not displayed]

FINDINGS: Normal cardiac silhouette.  Lungs are clear.

No free air beneath hemidiaphragms.  No dilated loops of large or
small bowel.  There is gas and stool in the rectum.  No pathologic
calcifications.  No bony abnormality.
IMPRESSION: 1.  No acute cardiopulmonary process.
2.  No evidence of bowel obstruction or free air.
3.  Normal exam.

## 2011-06-17 MED ORDER — ONDANSETRON 8 MG PO TBDP
8.0000 mg | ORAL_TABLET | Freq: Once | ORAL | Status: AC
  Administered 2011-06-17: 8 mg via ORAL
  Filled 2011-06-17: qty 1

## 2011-06-17 MED ORDER — MAGNESIUM CITRATE PO SOLN
296.0000 mL | Freq: Once | ORAL | Status: AC
  Administered 2011-06-17: 296 mL via ORAL
  Filled 2011-06-17: qty 296

## 2011-06-17 NOTE — ED Notes (Signed)
Pt presents to ED today with c/o constipation x1 week.  Pt was seen at [facility] and told to come to ER.  Pt has tried several OTC enema with no relief.

## 2011-06-17 NOTE — ED Provider Notes (Signed)
History     Chief Complaint  Patient presents with  . Constipation   Patient is a 20 y.o. female presenting with constipation. The history is provided by the patient.  Constipation  The current episode started 5 to 7 days ago. The onset was gradual. The problem occurs continuously. The problem has been unchanged. The pain is moderate. The stool is described as hard. Prior unsuccessful therapies include enemas. Associated symptoms include abdominal pain and nausea. Pertinent negatives include no fever, no hemorrhoids and no rectal pain.   Pt went to urgent care and had abd series that showed constipation, sent here for further eval History reviewed. No pertinent past medical history.  History reviewed. No pertinent past surgical history.  No family history on file.  History  Substance Use Topics  . Smoking status: Never Smoker   . Smokeless tobacco: Not on file  . Alcohol Use: No    OB History    Grav Para Term Preterm Abortions TAB SAB Ect Mult Living                  Review of Systems  Constitutional: Negative for fever.  Gastrointestinal: Positive for nausea, abdominal pain and constipation. Negative for rectal pain and hemorrhoids.  All other systems reviewed and are negative.    Physical Exam  BP 147/84  Pulse 60  Temp(Src) 99.2 F (37.3 C) (Oral)  Resp 18  SpO2 100%  LMP 06/15/2011  Physical Exam  Nursing note and vitals reviewed. Constitutional: She is oriented to person, place, and time. She appears well-developed and well-nourished.  Non-toxic appearance.  HENT:  Head: Normocephalic and atraumatic.  Eyes: Conjunctivae are normal. Pupils are equal, round, and reactive to light.  Neck: Normal range of motion.  Cardiovascular: Normal rate.   Pulmonary/Chest: Effort normal.  Abdominal: Soft. Normal appearance. She exhibits no distension. There is no tenderness. There is no rebound.  Genitourinary: Rectum normal. Rectal exam shows no external hemorrhoid and  no fissure.  Neurological: She is alert and oriented to person, place, and time.  Skin: Skin is warm and dry.  Psychiatric: She has a normal mood and affect.    ED Course  Procedures  MDM     Pt relates 2 year h/o similar sx, suspect ibs, pt given gi cocktail here and feels better, rectal exam without stool in vault, pt to f/u her gi md  Toy Baker, MD 06/18/11 0157

## 2011-06-18 LAB — URINALYSIS, ROUTINE W REFLEX MICROSCOPIC
Bilirubin Urine: NEGATIVE
Glucose, UA: NEGATIVE mg/dL
Hgb urine dipstick: NEGATIVE
Ketones, ur: 40 mg/dL — AB
Leukocytes, UA: NEGATIVE
Protein, ur: 30 mg/dL — AB
pH: 8.5 — ABNORMAL HIGH (ref 5.0–8.0)

## 2011-06-18 LAB — URINE MICROSCOPIC-ADD ON

## 2011-06-18 MED ORDER — GI COCKTAIL ~~LOC~~
30.0000 mL | Freq: Once | ORAL | Status: AC
  Administered 2011-06-18: 30 mL via ORAL
  Filled 2011-06-18: qty 30

## 2011-06-18 MED ORDER — FAMOTIDINE 20 MG PO TABS
40.0000 mg | ORAL_TABLET | Freq: Once | ORAL | Status: AC
  Administered 2011-06-18: 40 mg via ORAL
  Filled 2011-06-18: qty 2

## 2011-06-18 MED ORDER — HYDROCODONE-ACETAMINOPHEN 5-325 MG PO TABS
2.0000 | ORAL_TABLET | Freq: Once | ORAL | Status: AC
  Administered 2011-06-18: 2 via ORAL
  Filled 2011-06-18: qty 2

## 2011-06-18 MED ORDER — DICYCLOMINE HCL 20 MG PO TABS: 20.0000 mg | ORAL_TABLET | Freq: Two times a day (BID) | ORAL | Status: DC

## 2011-08-23 LAB — STREP A DNA PROBE: Group A Strep Probe: NEGATIVE
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
## 2012-05-15 ENCOUNTER — Encounter (HOSPITAL_COMMUNITY): Payer: Self-pay | Admitting: *Deleted

## 2012-05-15 ENCOUNTER — Emergency Department (HOSPITAL_COMMUNITY)
Admission: EM | Admit: 2012-05-15 | Discharge: 2012-05-16 | Disposition: A | Discharge status: Other Acute Inpatient Hospital | Payer: 59 | Source: Home / Self Care | Attending: Emergency Medicine | Admitting: Emergency Medicine

## 2012-05-15 DIAGNOSIS — T50901A Poisoning by unspecified drugs, medicaments and biological substances, accidental (unintentional), initial encounter: Secondary | ICD-10-CM

## 2012-05-15 DIAGNOSIS — F329 Major depressive disorder, single episode, unspecified: Secondary | ICD-10-CM

## 2012-05-15 HISTORY — DX: Anxiety disorder, unspecified: F41.9

## 2012-05-15 LAB — URINALYSIS, ROUTINE W REFLEX MICROSCOPIC
Bilirubin Urine: NEGATIVE
Glucose, UA: NEGATIVE mg/dL
Hgb urine dipstick: NEGATIVE
Protein, ur: NEGATIVE mg/dL
Urobilinogen, UA: 0.2 mg/dL (ref 0.0–1.0)

## 2012-05-15 LAB — PREGNANCY, URINE: Preg Test, Ur: NEGATIVE

## 2012-05-15 LAB — CBC
MCH: 30.8 pg (ref 26.0–34.0)
MCV: 90.7 fL (ref 78.0–100.0)
Platelets: 224 10*3/uL (ref 150–400)
RDW: 12.5 % (ref 11.5–15.5)
WBC: 8.5 10*3/uL (ref 4.0–10.5)

## 2012-05-15 LAB — COMPREHENSIVE METABOLIC PANEL
ALT: 8 U/L (ref 0–35)
AST: 13 U/L (ref 0–37)
Albumin: 4.2 g/dL (ref 3.5–5.2)
Calcium: 9.7 mg/dL (ref 8.4–10.5)
Potassium: 3.6 mEq/L (ref 3.5–5.1)
Sodium: 138 mEq/L (ref 135–145)
Total Protein: 7.2 g/dL (ref 6.0–8.3)

## 2012-05-15 LAB — SALICYLATE LEVEL: Salicylate Lvl: <2 mg/dL — ABNORMAL LOW (ref 2.8–20.0)

## 2012-05-15 LAB — DIFFERENTIAL
Basophils Absolute: 0 10*3/uL (ref 0.0–0.1)
Eosinophils Absolute: 0.1 10*3/uL (ref 0.0–0.7)
Eosinophils Relative: 1 % (ref 0–5)
Neutrophils Relative %: 61 % (ref 43–77)

## 2012-05-15 LAB — RAPID URINE DRUG SCREEN, HOSP PERFORMED
Amphetamines: NOT DETECTED
Benzodiazepines: NOT DETECTED
Opiates: NOT DETECTED
Tetrahydrocannabinol: POSITIVE — AB

## 2012-05-15 MED ORDER — SODIUM CHLORIDE 0.9 % IV BOLUS (SEPSIS)
1000.0000 mL | Freq: Once | INTRAVENOUS | Status: AC
  Administered 2012-05-15: 1000 mL via INTRAVENOUS

## 2012-05-15 NOTE — ED Notes (Signed)
She took 100 0.5mg  of xanax 20 minutes ago.  She last ate at 1300.  Alert  Nausea.  She was dropped off here.  lmp last month

## 2012-05-15 NOTE — BH Assessment (Signed)
[facility] Assessment Progress Note      Attempted to assess patient, but was unable to arouse.  Sitter also unable to arouse.  Notified RN to contact clinician when patient is awake.

## 2012-05-15 NOTE — ED Notes (Signed)
Pts best friend asked that her number be left on the patient chart "in case we need to call somebody" Gina Dorsey [phone]. Gina Dorsey is not a legal POA or representative.

## 2012-05-15 NOTE — ED Notes (Addendum)
Advised charge nurse that the day shift nurse advised that the patient's condition had worsened.  Also, the day RN stated that the patient said she took 100 Xanax.  Placed patient on full monitoring and advised charge RN we need a sitter a.s.a.p.  MD advised of new information/patient status.

## 2012-05-15 NOTE — ED Provider Notes (Signed)
History     CSN: [id number]  Arrival date & time 05/15/12  1811   First MD Initiated Contact with Patient 05/15/12 1846      Chief Complaint  Patient presents with  . Drug Overdose    (Consider location/radiation/quality/duration/timing/severity/associated sxs/prior treatment) HPI Pt admits to taking 80-100 xanax tabs (0.5 mg) roughly 20 min before arrival. She was dropped off. She states this was "a cry for help" but that she was not trying to end her life. She has been on multiple anti-depressants in the past but is currently not taking any psychiatric meds. She was admitted to [facility] years [facility]. Denies co-ingestions, EtOH. Admits to nausea.  Past Medical History  Diagnosis Date  . Bilateral posterior subcapsular polar cataract   . Anxiety   . IBS (irritable bowel syndrome)     Past Surgical History  Procedure Date  . Foot fracture surgery     No family history on file.  History  Substance Use Topics  . Smoking status: Current Everyday Smoker -- 2.0 packs/day  . Smokeless tobacco: Not on file  . Alcohol Use: No    OB History    Grav Para Term Preterm Abortions TAB SAB Ect Mult Living                  Review of Systems  Respiratory: Negative for shortness of breath.   Cardiovascular: Negative for chest pain.  Gastrointestinal: Positive for nausea. Negative for vomiting, abdominal pain and diarrhea.  Musculoskeletal: Negative for back pain.  Skin: Negative for rash and wound.  Neurological: Positive for light-headedness. Negative for weakness and numbness.  Psychiatric/Behavioral: Positive for self-injury and dysphoric mood. Negative for hallucinations.    Allergies  Morphine and related and Wellbutrin  Home Medications   Current Outpatient Rx  Name Route Sig Dispense Refill  . CITALOPRAM HYDROBROMIDE 20 MG PO TABS Oral Take 1 tablet (20 mg total) by mouth daily with breakfast. For Depression. 30 tablet 0  . CLONAZEPAM 0.5 MG PO TABS Oral Take 1  tablet (0.5 mg total) by mouth daily with breakfast. For anxiety and panic symptoms. 30 tablet 0  . LEVONORGESTREL-ETHINYL ESTRAD 0.1-20 MG-MCG PO TABS Oral Take 1 tablet by mouth daily. For contraception. 1 Package 0  . QUETIAPINE FUMARATE 25 MG PO TABS Oral Take 1 tablet (25 mg total) by mouth at bedtime. For mood stabilization. 30 tablet 0    BP 128/75  Pulse 88  Temp 98.7 F (37.1 C) (Oral)  Resp 24  Ht 5\' 4"  (1.626 m)  Wt 115 lb (52.164 kg)  BMI 19.74 kg/m2  SpO2 99%  LMP 05/15/2012  Physical Exam  Nursing note and vitals reviewed. Constitutional: She is oriented to person, place, and time. She appears well-developed and well-nourished. No distress.       Slurred speech  HENT:  Head: Normocephalic and atraumatic.  Mouth/Throat: Oropharynx is clear and moist.  Eyes: EOM are normal. Pupils are equal, round, and reactive to light.  Neck: Normal range of motion. Neck supple.  Cardiovascular: Normal rate and regular rhythm.   Pulmonary/Chest: Effort normal and breath sounds normal. No respiratory distress. She has no wheezes. She has no rales.  Abdominal: Soft. Bowel sounds are normal. There is no tenderness. There is no rebound and no guarding.  Musculoskeletal: Normal range of motion. She exhibits no edema and no tenderness.  Neurological: She is alert and oriented to person, place, and time.       5/5 motor, sensation intact.  Following commands. Protecting airway  Skin: Skin is warm and dry. No rash noted. No erythema.    ED Course  Procedures (including critical care time)  Labs Reviewed  URINE RAPID DRUG SCREEN (HOSP PERFORMED) - Abnormal; Notable for the following:    Tetrahydrocannabinol POSITIVE (*)     All other components within normal limits  COMPREHENSIVE METABOLIC PANEL - Abnormal; Notable for the following:    Glucose, Bld 101 (*)     Total Bilirubin 0.2 (*)     All other components within normal limits  SALICYLATE LEVEL - Abnormal; Notable for the  following:    Salicylate Lvl <2.0 (*)     All other components within normal limits  URINALYSIS, ROUTINE W REFLEX MICROSCOPIC  PREGNANCY, URINE  CBC  DIFFERENTIAL  ETHANOL  ACETAMINOPHEN LEVEL  LAB REPORT - SCANNED   No results found.   1. Overdose   2. Depression       MDM    Pt VS remained stable. Fully alert. Drug screen negative for benzos. Suspect she did not overdose. ACT to eval. Medically cleared.      Loren Racer, MD 05/18/12 [phone]

## 2012-05-15 NOTE — ED Notes (Signed)
Act team at bedside, reports "she sleeping, call me when she wakes up". Pt will be paged upon pt arousable and alert.

## 2012-05-15 NOTE — ED Notes (Signed)
Pt reports taking over 100 xanax at approx 6:30 pm tonight.  Claims she was not attempting to kill herself and she does not want to die.  Pt states she is crying for help.  She went to crisis center and they would not help her bc she has insurance but her insurance doesn't cover psych.  Pt states she has history of bipolar, depression and psych issues.  Pt denies previous attempts at suicide.   Pt reports beginning to feel the "trip" from the drugs.  Speech pattern has slowed

## 2012-05-16 ENCOUNTER — Inpatient Hospital Stay (HOSPITAL_COMMUNITY)
Admission: EM | Admit: 2012-05-16 | Discharge: 2012-05-18 | DRG: 885 | Disposition: A | Discharge status: Home / Self Care | Payer: 59 | Source: Ambulatory Visit | Attending: Psychiatry | Admitting: Psychiatry

## 2012-05-16 ENCOUNTER — Encounter (HOSPITAL_COMMUNITY): Payer: Self-pay | Admitting: Rehabilitation

## 2012-05-16 DIAGNOSIS — F1220 Cannabis dependence, uncomplicated: Secondary | ICD-10-CM | POA: Diagnosis present

## 2012-05-16 DIAGNOSIS — H269 Unspecified cataract: Secondary | ICD-10-CM | POA: Diagnosis present

## 2012-05-16 DIAGNOSIS — F411 Generalized anxiety disorder: Secondary | ICD-10-CM | POA: Diagnosis present

## 2012-05-16 DIAGNOSIS — F603 Borderline personality disorder: Secondary | ICD-10-CM | POA: Diagnosis present

## 2012-05-16 DIAGNOSIS — F410 Panic disorder [episodic paroxysmal anxiety] without agoraphobia: Secondary | ICD-10-CM | POA: Diagnosis present

## 2012-05-16 DIAGNOSIS — K589 Irritable bowel syndrome without diarrhea: Secondary | ICD-10-CM | POA: Diagnosis present

## 2012-05-16 DIAGNOSIS — F3189 Other bipolar disorder: Principal | ICD-10-CM

## 2012-05-16 DIAGNOSIS — F3181 Bipolar II disorder: Secondary | ICD-10-CM | POA: Diagnosis present

## 2012-05-16 HISTORY — DX: Irritable bowel syndrome, unspecified: K58.9

## 2012-05-16 MED ORDER — LORAZEPAM 2 MG/ML IJ SOLN
1.0000 mg | Freq: Once | INTRAMUSCULAR | Status: AC
  Administered 2012-05-16: 1 mg via INTRAVENOUS
  Filled 2012-05-16: qty 1

## 2012-05-16 MED ORDER — LEVONORGESTREL-ETHINYL ESTRAD 0.1-20 MG-MCG PO TABS
1.0000 | ORAL_TABLET | Freq: Every day | ORAL | Status: DC
  Administered 2012-05-17 – 2012-05-18 (×2): 1 via ORAL
  Filled 2012-05-16 (×3): qty 1

## 2012-05-16 MED ORDER — NICOTINE 21 MG/24HR TD PT24: 21.0000 mg | MEDICATED_PATCH | Freq: Every day | TRANSDERMAL | Status: DC

## 2012-05-16 MED ORDER — ONDANSETRON HCL 8 MG PO TABS
4.0000 mg | ORAL_TABLET | Freq: Three times a day (TID) | ORAL | Status: DC | PRN
Start: 2012-05-16 — End: 2012-05-16

## 2012-05-16 MED ORDER — QUETIAPINE FUMARATE 25 MG PO TABS
25.0000 mg | ORAL_TABLET | Freq: Every day | ORAL | Status: DC
  Filled 2012-05-16: qty 1
  Filled 2012-05-16: qty 14
  Filled 2012-05-16: qty 1

## 2012-05-16 MED ORDER — ACETAMINOPHEN 325 MG PO TABS: 650.0000 mg | ORAL_TABLET | ORAL | Status: DC | PRN

## 2012-05-16 MED ORDER — LORAZEPAM 2 MG/ML IJ SOLN
INTRAMUSCULAR | Status: AC
  Administered 2012-05-16: 2 mg via INTRAMUSCULAR
  Filled 2012-05-16: qty 1

## 2012-05-16 MED ORDER — CLONAZEPAM 0.5 MG PO TABS
0.5000 mg | ORAL_TABLET | Freq: Every day | ORAL | Status: DC
  Administered 2012-05-16 – 2012-05-18 (×3): 0.5 mg via ORAL
  Filled 2012-05-16 (×3): qty 1
  Filled 2012-05-16: qty 14

## 2012-05-16 MED ORDER — HYDROXYZINE HCL 25 MG PO TABS
25.0000 mg | ORAL_TABLET | Freq: Four times a day (QID) | ORAL | Status: DC | PRN
  Administered 2012-05-16: 25 mg via ORAL

## 2012-05-16 MED ORDER — ALUM & MAG HYDROXIDE-SIMETH 200-200-20 MG/5ML PO SUSP: 30.0000 mL | ORAL | Status: DC | PRN

## 2012-05-16 MED ORDER — QUETIAPINE FUMARATE 50 MG PO TABS
50.0000 mg | ORAL_TABLET | Freq: Every day | ORAL | Status: DC
  Filled 2012-05-16 (×2): qty 1

## 2012-05-16 MED ORDER — LORAZEPAM 1 MG PO TABS: 1.0000 mg | ORAL_TABLET | Freq: Three times a day (TID) | ORAL | Status: DC | PRN

## 2012-05-16 MED ORDER — ZOLPIDEM TARTRATE 5 MG PO TABS: 5.0000 mg | ORAL_TABLET | Freq: Every evening | ORAL | Status: DC | PRN

## 2012-05-16 MED ORDER — MAGNESIUM HYDROXIDE 400 MG/5ML PO SUSP: 30.0000 mL | Freq: Every day | ORAL | Status: DC | PRN

## 2012-05-16 MED ORDER — IBUPROFEN 400 MG PO TABS: 600.0000 mg | ORAL_TABLET | Freq: Three times a day (TID) | ORAL | Status: DC | PRN

## 2012-05-16 MED ORDER — CITALOPRAM HYDROBROMIDE 20 MG PO TABS
20.0000 mg | ORAL_TABLET | Freq: Every day | ORAL | Status: DC
  Administered 2012-05-16: 20 mg via ORAL
  Filled 2012-05-16: qty 1
  Filled 2012-05-16: qty 14
  Filled 2012-05-16 (×3): qty 1

## 2012-05-16 MED ORDER — QUETIAPINE FUMARATE 25 MG PO TABS
25.0000 mg | ORAL_TABLET | Freq: Every day | ORAL | Status: DC
  Filled 2012-05-16: qty 1

## 2012-05-16 MED ORDER — QUETIAPINE FUMARATE 50 MG PO TABS
50.0000 mg | ORAL_TABLET | Freq: Every day | ORAL | Status: DC
  Filled 2012-05-16: qty 1

## 2012-05-16 MED ORDER — HYDROXYZINE HCL 25 MG PO TABS: 25.0000 mg | ORAL_TABLET | Freq: Four times a day (QID) | ORAL | Status: DC | PRN

## 2012-05-16 MED ORDER — LORAZEPAM 1 MG PO TABS: 2.0000 mg | ORAL_TABLET | Freq: Once | ORAL | Status: DC

## 2012-05-16 MED ORDER — ACETAMINOPHEN 325 MG PO TABS: 650.0000 mg | ORAL_TABLET | Freq: Four times a day (QID) | ORAL | Status: DC | PRN

## 2012-05-16 NOTE — ED Notes (Signed)
Pt mother left cell number and wants to be notified when pt is transferred to [facility]. [phone]  Annabelle Harman

## 2012-05-16 NOTE — Progress Notes (Signed)
[facility] Group Notes:  (Counselor/Nursing/MHT/Case Management/Adjunct) 05/16/2012  11:00 Emotion Regulation   Type of Therapy:  Group Therapy  Participation Level:  Did Not Attend     [name] 05/16/2012   2:22 PM      [facility] Group Notes:  (Counselor/Nursing/MHT/Case Management/Adjunct) 05/16/2012  1:15pm Breathing & Relaxation for Anxiety and Anger   Type of Therapy:  Group Therapy  Participation Level:  Did Not Attend     [name] 05/16/2012   2:22 PM

## 2012-05-16 NOTE — Progress Notes (Signed)
Gina Dorsey is a 21 year old female admitted to [facility] from [facility] following an event where she took 100 Xanax tablets.  Patient is alert and oriented, was very agitated upon admission, but is currently cooperative.  She states that she knew she "wouldn't die" from taking the pills and she was just angry.  She states that she feels that "anxiety" is her primary issue and she is very impulsive.  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  She currently states that she would like to "get a new job" and clean up her life.  She denies SI/HI/AVH at this time.  Oriented to unit.
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
## 2012-05-16 NOTE — [facility] Suicide Risk Assessment (Signed)
Suicide Risk Assessment  Admission Assessment     Demographic factors:  Assessment Details Time of Assessment: Admission Information Obtained From: Patient Current Mental Status:  Single. Loss Factors:  Family Discord. Historical Factors:  Long history of mental health issues. Risk Reduction Factors:  Good family support.  Good access to healthcare.  CLINICAL FACTORS:   Severe Anxiety and/or Agitation Panic Attacks Bipolar Disorder:   Bipolar II Depression:   Anhedonia Comorbid alcohol abuse/dependence Hopelessness Impulsivity Alcohol/Substance Abuse/Dependencies Personality Disorders:   Cluster B Comorbid alcohol abuse/dependence Comorbid depression More than one psychiatric diagnosis Unstable or Poor Therapeutic Relationship Previous Psychiatric Diagnoses and Treatments  COGNITIVE FEATURES THAT CONTRIBUTE TO RISK:  Thought constriction (tunnel vision)    Diagnosis:  Axis I:  Bipolar II Disorder - Most Recent Episode - Hypomanic.  Generalized Anxiety Disorder.  Panic Disorder without Agoraphobia.  Cannabis Dependence. Axis II: Borderline Personality Disorder.   The patient was seen today and reports the following:   ADL's: Intact.  Sleep: The patient reports to sleeping too much during the day and averages 12 to 14 hours a night.  Appetite: The patient reports her appetite is decreased but with no recent weight loss.   Mild>(1-10) >Severe  Hopelessness (1-10): 8  Depression (1-10): 7  Anxiety (1-10): 10   Suicidal Ideation: The patient adamantly denies any suicidal ideations today.  Plan: No  Intent: No  Means: No   Homicidal Ideation: The patient adamantly denies any homicidal ideations today.  Plan: No  Intent: No.  Means: No   General Appearance/Behavior:  The patient was friendly and cooperative today with this provider but was initially uncooperative when she entered the unit. Eye Contact: Good.  Speech: Appropriate in rate and volume with no  pressuring noted today.  Motor Behavior: wnl.  Level of Consciousness: Alert and Oriented x 3.  Mental Status: Alert and Oriented x 3.  Mood: Appears moderately depressed today.  Affect: Appears moderately labile.  Anxiety Level: Severe anxiety reported today but the patient appears mild to moderately anxious.  Thought Process: wnl.  Thought Content: The patient denies any auditory or visual hallucinations today as well as any delusional thinking.  Perception: wnl.  Judgment: Fair.  Insight: Fair.  Cognition: Oriented to person, place and time.   Current Medications:    . citalopram  20 mg Oral Q breakfast  . clonazePAM  0.5 mg Oral Q breakfast  . levonorgestrel-ethinyl estradiol  1 tablet Oral Daily  . QUEtiapine  50 mg Oral QHS  . DISCONTD: QUEtiapine  25 mg Oral QHS  . DISCONTD: QUEtiapine  50 mg Oral QHS   Review of Systems:  Neurological: The patient denies any headaches today. She denies any seizures or dizziness.  G.I.: The patient denies any constipation or G.I. Upset today.  Musculoskeletal: The patient denies any muscle or skeletal difficulties.   Time was spent today discussing with the patient her current symptoms. The patient reports to over sleeping most nights and may average 12 to 14 hours of sleep per night.. She reports that her appetite is decreased but with no recent weight loss. The patient reports severe feelings of sadness, anhedonia and depressed mood but appears moderately depressed today. The patient also reports severe anxiety symptoms but appears to be mild to moderately anxious.  She does adamantly denies any suicidal or homicidal ideations and states that she overdosed on Valium "as a cry for help" for her panic symptoms.  However the patient's urine drug screen showed no benzodiazepines.. She  also denies any auditory or visual hallucinations or delusional thinking today.   The patient states that she smokes marijuana daily in order to treat her panic and  anxiety symptoms.  She states a Primary Care Physician once prescribed Klonopin once daily for her panic and anxiety symptoms with good results.  She states during the time she took the Klonopin, she did not feel the need to self medicate with cannabis.  She denies any abuse of alcohol or other illicit drugs.  Treatment Plan Summary:  1. Daily contact with patient to assess and evaluate symptoms and progress in treatment.  2. Medication management  3. The patient will deny suicidal ideations or homicidal ideations for 48 hours prior to discharge and have a depression and anxiety rating of 3 or less. The patient will also deny any auditory or visual hallucinations or delusional thinking.  4. The patient will deny any symptoms of substance withdrawal at time of discharge.   Plan:  1. Will start the medication Celexa at 20 mgs po q am for depression, anxiety and panic symptoms.  2. Will start the medication Klonopin at 0.5 mgs po q am for anxiety and panic symptoms.  3. Will start the medication Seroquel at 25 mgs po qhs for anxiety, panic symptoms and mood stabilization. 4. Will continue the patient's oral contraception as she takes at home. 5. Laboratory studies reviewed.  6. Will continue to monitor.   SUICIDE RISK:   Mild:  Suicidal ideation of limited frequency, intensity, duration, and specificity.  There are no identifiable plans, no associated intent, mild dysphoria and related symptoms, good self-control (both objective and subjective assessment), few other risk factors, and identifiable protective factors, including available and accessible social support.  Gina Dorsey 05/16/2012, 9:32 PM

## 2012-05-16 NOTE — ED Notes (Signed)
Pt signed voluntary commitment papers, called her father, from RN phone, to let him know what clothes to bring her at [facility]. Pt requested to have her cell phone to call her friends and was denied use of cell phone by RN.

## 2012-05-16 NOTE — ED Notes (Signed)
Pt using foul language to staff and mother at bedside, plan of care is updated with verbal understanding. Pt reports having nausea, refuse nausea medications. Pt states "i am going outside to smoke", pt informed no smoking and we can get a nicotine patch, pt refused nicotine patch. MD will be made aware, security called for pt cooperative at this time. Pt continue to use foul language.

## 2012-05-16 NOTE — BH Assessment (Signed)
[facility] Assessment Progress Note      Pt accepted to [facility] by Dr Theotis Barrio [phone]

## 2012-05-16 NOTE — ED Notes (Signed)
Act Team at bedside.  

## 2012-05-16 NOTE — ED Provider Notes (Addendum)
BP 108/72  Pulse 61  Temp 98.7 F (37.1 C) (Oral)  Resp 18  Ht 5\' 4"  (1.626 m)  Wt 115 lb (52.164 kg)  BMI 19.74 kg/m2  SpO2 100%  LMP 05/15/2012  Per nursing staff patient agitated and "asking for something to calm down". Ativan and ED holding orders written.  Forbes Cellar, MD 05/16/12 0414  BP 108/72  Pulse 61  Temp 98.7 F (37.1 C) (Oral)  Resp 18  Ht 5\' 4"  (1.626 m)  Wt 115 lb (52.164 kg)  BMI 19.74 kg/m2  SpO2 100%  LMP 05/15/2012  Pt without complaints at this time. Alert and cooperative.  Forbes Cellar, MD 05/16/12 [phone]  Per nursing staff pt agitated and dialing 911 from her room. Ativan ordered.  Forbes Cellar, MD 05/16/12 [phone]

## 2012-05-16 NOTE — ED Notes (Signed)
Pt is awake and talking on cell phone, ACT team paged for pt evaluation, sitter remains at bedside and will continue to monitor pt.

## 2012-05-16 NOTE — Progress Notes (Signed)
Patient angry, loud and verbally aggressive. Patient verbally aggressive with parents while visiting in dayroom. Nursing staff repeatedly asked patient to lower voice as she was disturbing other patients on the unit. Patient continued with escalating verbal aggression toward parents and staff. Visiting parents were subsequently asked to leave, parents were instructed that they could return to visit tomorrow. Parents verbalized understanding.  Patient angry, cursing, yelling and slamming down the phone in the 500 hallway. Patient informed she must be more respectful of the hospital property(phone) and the other patients on the unit. Patient continued to behave aggressively. Patient informed she would not be allowed to use the phone any more tonight. Patient informed she would be allowed to speak with physician in the morning to discuss telephone usage. Patient verbalizes understanding. Will continue to monitor.
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
## 2012-05-16 NOTE — Progress Notes (Signed)
This is a new pt; SW observed pt walking around the hall.  SW spoke with pt individually at this time.  Pt was tearful and states this is her problem.  Pt states that she is always crying, doesn't want to get out of bed and has social anxiety.  Pt states that she wants to get help and has been to [facility] numerous times over the past 2 weeks.  Pt explained that she is on her mom's insurance but it doesn't cover mental health, but Vesta Mixer considers it insurance and won't help her at all.  Pt states that this is frustrating.  Pt states that she lives with her mom and dad in [place] and reports they are supportive.  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  Pt states that she's been doing this for the past year - 2 years.  Pt states that she doesn't want to but can't get a job.  Pt states that she self medicates by using $100 worth of marijuana daily.  SW left a message with a [facility] to coordinate outpatient services for pt.  No further needs voiced by pt at this time.    Reyes Ivan, [place] 05/16/2012  3:36 PM

## 2012-05-16 NOTE — BH Assessment (Signed)
Assessment Note   Gina Dorsey is an 21 y.o. female brought to [facility] after a reported overdose on 100 Diazepam.  The patient states she has been feeling suicidal almost constantly since the age of 57.  She reports that she has panic attacks several times a day and becomes very angry and distraught and today when she had one she decided to take all of her Xanax.  She admits that she did not obtain these pills legally, but has been self medicating because a doctor won't prescribe them to her and the xanax and daily marijuana use are the only things that help.  (Despite her reported overdose and previous difficulty to rouse her UDS did not indicate any benzos in her system.  She reports she usually takes 1 .5 mg tablet daily)  Gina Dorsey is hopeless and helpless upon assessment.  She demonstrates pressured speech and tangential thinking, she is frequently tearful stating no one understands how she's feeling and nothing ever helps.  She reports she has seen a few doctors, primarily primary care providers, but the medicines make her feel weird so she discontinues them.  She states she attempted to get help at [facility] earlier this week, but they turned her away because she had insurance.  The patient is appropriate for inpatient care due to impulsive self-destructive behavior, suicidal ideation with plan, and unstable mood.     Axis I: Mood Disorder NOS Axis II: Deferred Axis III:  Past Medical History  Diagnosis Date  . Bilateral posterior subcapsular polar cataract   . Anxiety    Axis IV: economic problems, housing problems, occupational problems and problems with primary support group Axis V: 31-40 impairment in reality testing  Past Medical History:  Past Medical History  Diagnosis Date  . Bilateral posterior subcapsular polar cataract   . Anxiety     History reviewed. No pertinent past surgical history.  Family History: No family history on file.  Social History:  reports that she has never  smoked. She does not have any smokeless tobacco history on file. She reports that she does not drink alcohol or use illicit drugs.  Additional Social History:  Alcohol / Drug Use History of alcohol / drug use?: Yes Substance #1 Name of Substance 1: Marijuana 1 - Age of First Use: 15 1 - Amount (size/oz): quarter bag 1 - Frequency: daily 1 - Duration: 5 years 1 - Last Use / Amount: Today quarter bag  CIWA: CIWA-Ar BP: 108/72 mmHg Pulse Rate: 61  COWS:    Allergies:  Allergies  Allergen Reactions  . Morphine And Related Hives    Confusion and disorientation  . Wellbutrin (Bupropion) Nausea And Vomiting    Home Medications:  (Not in a hospital admission)  OB/GYN Status:  Patient's last menstrual period was 05/15/2012.  General Assessment Data Location of Assessment: [facility] ED Living Arrangements: Parent Can pt return to current living arrangement?: Yes Admission Status: Voluntary Is patient capable of signing voluntary admission?: Yes Transfer from: Acute Hospital Referral Source: Self/Family/Friend  Education Status Is patient currently in school?: No  Risk to self Suicidal Ideation: Yes-Currently Present Suicidal Intent: No-Not Currently/Within Last 6 Months Is patient at risk for suicide?: Yes Suicidal Plan?: Yes-Currently Present Specify Current Suicidal Plan: overdose Access to Means: Yes Specify Access to Suicidal Means: rx medication What has been your use of drugs/alcohol within the last 12 months?: marijuana daily, xanax off the street Previous Attempts/Gestures: Yes How many times?: 1  (reports attempted to overdose today) Triggers  for Past Attempts: Family contact;Other (Comment) (feeling worthless) Intentional Self Injurious Behavior: None Family Suicide History: Unknown Recent stressful life event(s): Financial Problems;Job Loss;Other (Comment) (constant depression, 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, prostituting, stripping) Persecutory voices/beliefs?: No Depression:  Yes Depression Symptoms: Despondent;Tearfulness;Isolating;Guilt;Fatigue;Loss of interest in usual pleasures;Feeling worthless/self pity;Feeling angry/irritable Substance abuse history and/or treatment for substance abuse?: No Suicide prevention information given to non-admitted patients: Not applicable  Risk to Others Homicidal Ideation: No Thoughts of Harm to Others: No Current Homicidal Intent: No Current Homicidal Plan: No Access to Homicidal Means: No History of harm to others?: No Assessment of Violence: None Noted Does patient have access to weapons?: No Criminal Charges Pending?: No Does patient have a court date: No  Psychosis Hallucinations: None noted Delusions: None noted  Mental Status Report Appear/Hygiene: Disheveled Eye Contact: Fair Motor Activity: Freedom of movement Speech: Pressured;Rapid Level of Consciousness: Alert Mood: Labile;Depressed;Anxious;Irritable;Helpless Affect: Sad;Anxious;Angry;Depressed Anxiety Level: Panic Attacks Panic attack frequency: 2-3 daily Most recent panic attack: today Thought Processes: Tangential Judgement: Impaired Orientation: Person;Place;Time;Situation Obsessive Compulsive Thoughts/Behaviors: Moderate  Cognitive Functioning Concentration: Decreased Memory: Remote Intact;Recent Intact IQ: Average Insight: Poor Impulse Control: Poor Appetite: Poor Weight Loss:  (unknown) Weight Gain: 0  Sleep: Increased Total Hours of Sleep: 20  Vegetative Symptoms: Staying in bed  ADLScreening [facility] Assessment Services) Patient's cognitive ability adequate to safely complete daily activities?: Yes Patient able to express need for assistance with ADLs?: Yes Independently performs ADLs?: Yes  Abuse/Neglect [facility]) Physical Abuse: Denies Verbal Abuse: Denies Sexual Abuse: Denies  Prior Inpatient Therapy Prior Inpatient Therapy: Yes Prior Therapy Dates: 2008 Prior Therapy Facilty/Provider(s): [facility] Reason for Treatment: SI,  Depression  Prior Outpatient Therapy Prior Outpatient Therapy: Yes Prior Therapy Dates: varies Prior Therapy Facilty/Provider(s): can't remember Reason for Treatment: depression  ADL Screening (condition at time of admission) Patient's cognitive ability adequate to safely complete daily activities?: Yes Patient able to express need for assistance with ADLs?: Yes Independently performs ADLs?: Yes       Abuse/Neglect Assessment (Assessment to be complete while patient is alone) Physical Abuse: Denies Verbal Abuse: Denies Sexual Abuse: Denies Exploitation of patient/patient's resources: Denies Self-Neglect: Denies     Merchant navy officer (For Healthcare) Advance Directive: Patient does not have advance directive Pre-existing out of facility DNR order (yellow form or pink MOST form): No Nutrition Screen Diet: Other (Comment) (IBS)  Additional Information 1:1 In Past 12 Months?: No CIRT Risk: No Elopement Risk: No Does patient have medical clearance?: Yes     Disposition:  Disposition Disposition of Patient: Inpatient treatment program Type of inpatient treatment program: Adult (Pending review at [facility])  On Site Evaluation by:  Ranae Palms Reviewed with Physician:     Steward Ros 05/16/2012 4:18 AM

## 2012-05-16 NOTE — ED Notes (Signed)
Report called to Lupita Leash at [facility]. NAD noted at time of transfer to [facility].

## 2012-05-16 NOTE — ED Notes (Signed)
Medicated pt and sitter at bedside.

## 2012-05-16 NOTE — ED Notes (Signed)
Pt in room on stretcher talking with sitter and tearful.

## 2012-05-17 NOTE — Tx Team (Signed)
Interdisciplinary Treatment Plan Update (Adult)  Date:  05/17/2012  Time Reviewed:  10:14 AM   Progress in Treatment: Attending groups: Yes Participating in groups:  Yes Taking medication as prescribed: Yes Tolerating medication:  Yes Family/Significant other contact made:  Counselor will make appropriate contact Patient understands diagnosis:  Yes Discussing patient identified problems/goals with staff:  Yes Medical problems stabilized or resolved:  Yes Denies suicidal/homicidal ideation: Yes Issues/concerns per patient self-inventory:  None identified Other: N/A  New problem(s) identified: None Identified  Reason for Continuation of Hospitalization: Anxiety Depression Medication stabilization  Interventions implemented related to continuation of hospitalization: mood stabilization, medication monitoring and adjustment, group therapy and psycho education, safety checks q 15 mins  Additional comments: N/A  Estimated length of stay: 1 day  Discharge Plan: Pt will follow up with [facility] for IOP and outpt services  New goal(s): N/A  Review of initial/current patient goals per problem list:    1.  Goal(s): Reduce depressive symptoms  Met:  No  Target date: by discharge  As evidenced by: Reducing depression from a 10 to a 3 as reported by pt.   2.  Goal (s): Reduce/Eliminate suicidal ideation  Met:  No  Target date: by discharge  As evidenced by: pt reporting no SI.    3.  Goal(s): Reduce anxiety symptoms  Met:  No  Target date: by discharge  As evidenced by: Reduce anxiety from a 10 to a 3 as reported by pt.    Attendees: Patient:  Gina Dorsey 05/17/2012 10:16 AM   Family:     Physician:  Franchot Gallo, MD  05/17/2012  10:14 AM   Nursing:   Roswell Miners, RN 05/17/2012 10:16 AM   Case Manager:  Reyes Ivan, LCSWA 05/17/2012  10:14 AM   Counselor:  Angus Palms, LCSW 05/17/2012  10:14 AM   Other:  Juline Patch, LCSW  05/17/2012  10:14 AM   Other:  Corinne Ports, Psych Intern 05/17/2012  10:14 AM   Other:  Clarice Pole, LCASA 05/17/2012 10:17 AM   Other:      Scribe for Treatment Team:   Carmina Miller, 05/17/2012 , 10:14 AM
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
## 2012-05-17 NOTE — Progress Notes (Signed)
[facility] MD Progress Note  05/17/2012 4:58 PM  Diagnosis:  Axis I: Bipolar II Disorder - Most Recent Episode - Hypomanic.  Generalized Anxiety Disorder.  Panic Disorder without Agoraphobia.  Cannabis Dependence.  Axis II: Borderline Personality Disorder.   The patient was seen today and reports the following:   ADL's: Intact.  Sleep: The patient reports to sleeping well last night with no oversedation this morning. Appetite: The patient reports a good appetite today.   Mild>(1-10) >Severe  Hopelessness (1-10): 0 Depression (1-10): 2  Anxiety (1-10): 2   Suicidal Ideation:  The patient adamantly denies any suicidal ideations today.  Plan: No  Intent: No  Means: No   Homicidal Ideation: The patient adamantly denies any homicidal ideations today.  Plan: No  Intent: No.  Means: No   General Appearance/Behavior: The patient was friendly and cooperative today with this provider.  Eye Contact: Good.  Speech: Appropriate in rate and volume with no pressuring noted today.  Motor Behavior: wnl.  Level of Consciousness: Alert and Oriented x 3.  Mental Status: Alert and Oriented x 3.  Mood: Appears mildly depressed today.  Affect: Appears mildly constricted.  Anxiety Level: Mild anxiety noted today with no panic symptoms.  Thought Process: wnl.  Thought Content: The patient denies any auditory or visual hallucinations today as well as any delusional thinking.  Perception: wnl.  Judgment: Fair to Good.  Insight: Fair to Good.  Cognition: Oriented to person, place and time.  Sleep:  Number of Hours: 5.5    Vital Signs:Blood pressure 147/97, pulse 81, temperature 98 F (36.7 C), temperature source Oral, resp. rate 18, height 5\' 4"  (1.626 m), weight 53.978 kg (119 lb), last menstrual period 05/15/2012.  Current Medications: Current Facility-Administered Medications  Medication Dose Route Frequency Provider Last Rate Last Dose  . acetaminophen (TYLENOL) tablet 650 mg  650 mg Oral Q6H  PRN Wonda Cerise, MD      . alum & mag hydroxide-simeth (MAALOX/MYLANTA) 200-200-20 MG/5ML suspension 30 mL  30 mL Oral Q4H PRN Wonda Cerise, MD      . citalopram (CELEXA) tablet 20 mg  20 mg Oral Q breakfast Curlene Labrum Syris Brookens, MD   20 mg at 05/16/12 1547  . clonazePAM (KLONOPIN) tablet 0.5 mg  0.5 mg Oral Q breakfast Curlene Labrum Francella Barnett, MD   0.5 mg at 05/17/12 0805  . hydrOXYzine (ATARAX/VISTARIL) tablet 25 mg  25 mg Oral Q6H PRN Mike Craze, MD   25 mg at 05/16/12 2223  . levonorgestrel-ethinyl estradiol (AVIANE,ALESSE,LESSINA) 0.1-20 MG-MCG per tablet 1 tablet  1 tablet Oral Daily Ronny Bacon, MD   1 tablet at 05/17/12 0805  . magnesium hydroxide (MILK OF MAGNESIA) suspension 30 mL  30 mL Oral Daily PRN Wonda Cerise, MD      . QUEtiapine (SEROQUEL) tablet 25 mg  25 mg Oral QHS Curlene Labrum Bernarda Erck, MD      . DISCONTD: hydrOXYzine (ATARAX/VISTARIL) tablet 25 mg  25 mg Oral Q6H PRN Mike Craze, MD      . DISCONTD: QUEtiapine (SEROQUEL) tablet 25 mg  25 mg Oral QHS Curlene Labrum Gustaf Mccarter, MD      . DISCONTD: QUEtiapine (SEROQUEL) tablet 50 mg  50 mg Oral QHS Mike Craze, MD      . DISCONTD: QUEtiapine (SEROQUEL) tablet 50 mg  50 mg Oral QHS Mike Craze, MD       Lab Results:  Results for orders placed during the hospital encounter of 05/15/12 (from the past  48 hour(s))  URINALYSIS, ROUTINE W REFLEX MICROSCOPIC     Status: Normal   Collection Time   05/15/12  6:27 PM      Component Value Range Comment   Color, Urine YELLOW  YELLOW    APPearance CLEAR  CLEAR    Specific Gravity, Urine 1.010  1.005 - 1.030    pH 7.5  5.0 - 8.0    Glucose, UA NEGATIVE  NEGATIVE mg/dL    Hgb urine dipstick NEGATIVE  NEGATIVE    Bilirubin Urine NEGATIVE  NEGATIVE    Ketones, ur NEGATIVE  NEGATIVE mg/dL    Protein, ur NEGATIVE  NEGATIVE mg/dL    Urobilinogen, UA 0.2  0.0 - 1.0 mg/dL    Nitrite NEGATIVE  NEGATIVE    Leukocytes, UA NEGATIVE  NEGATIVE MICROSCOPIC NOT DONE ON URINES WITH NEGATIVE PROTEIN, BLOOD,  LEUKOCYTES, NITRITE, OR GLUCOSE <1000 mg/dL.  PREGNANCY, URINE     Status: Normal   Collection Time   05/15/12  6:27 PM      Component Value Range Comment   Preg Test, Ur NEGATIVE  NEGATIVE   URINE RAPID DRUG SCREEN (HOSP PERFORMED)     Status: Abnormal   Collection Time   05/15/12  6:27 PM      Component Value Range Comment   Opiates NONE DETECTED  NONE DETECTED    Cocaine NONE DETECTED  NONE DETECTED    Benzodiazepines NONE DETECTED  NONE DETECTED    Amphetamines NONE DETECTED  NONE DETECTED    Tetrahydrocannabinol POSITIVE (*) NONE DETECTED    Barbiturates NONE DETECTED  NONE DETECTED   CBC     Status: Normal   Collection Time   05/15/12  6:27 PM      Component Value Range Comment   WBC 8.5  4.0 - 10.5 K/uL    RBC 4.41  3.87 - 5.11 MIL/uL    Hemoglobin 13.6  12.0 - 15.0 g/dL    HCT 19.1  47.8 - 29.5 %    MCV 90.7  78.0 - 100.0 fL    MCH 30.8  26.0 - 34.0 pg    MCHC 34.0  30.0 - 36.0 g/dL    RDW 62.1  30.8 - 65.7 %    Platelets 224  150 - 400 K/uL   DIFFERENTIAL     Status: Normal   Collection Time   05/15/12  6:27 PM      Component Value Range Comment   Neutrophils Relative 61  43 - 77 %    Neutro Abs 5.2  1.7 - 7.7 K/uL    Lymphocytes Relative 33  12 - 46 %    Lymphs Abs 2.8  0.7 - 4.0 K/uL    Monocytes Relative 5  3 - 12 %    Monocytes Absolute 0.5  0.1 - 1.0 K/uL    Eosinophils Relative 1  0 - 5 %    Eosinophils Absolute 0.1  0.0 - 0.7 K/uL    Basophils Relative 0  0 - 1 %    Basophils Absolute 0.0  0.0 - 0.1 K/uL   COMPREHENSIVE METABOLIC PANEL     Status: Abnormal   Collection Time   05/15/12  6:27 PM      Component Value Range Comment   Sodium 138  135 - 145 mEq/L    Potassium 3.6  3.5 - 5.1 mEq/L    Chloride 102  96 - 112 mEq/L    CO2 24  19 - 32 mEq/L  Glucose, Bld 101 (*) 70 - 99 mg/dL    BUN 7  6 - 23 mg/dL    Creatinine, Ser 3.08  0.50 - 1.10 mg/dL    Calcium 9.7  8.4 - 65.7 mg/dL    Total Protein 7.2  6.0 - 8.3 g/dL    Albumin 4.2  3.5 - 5.2 g/dL      AST 13  0 - 37 U/L    ALT 8  0 - 35 U/L    Alkaline Phosphatase 82  39 - 117 U/L    Total Bilirubin 0.2 (*) 0.3 - 1.2 mg/dL    GFR calc non Af Amer >90  >90 mL/min    GFR calc Af Amer >90  >90 mL/min   ETHANOL     Status: Normal   Collection Time   05/15/12  6:27 PM      Component Value Range Comment   Alcohol, Ethyl (B) <11  0 - 11 mg/dL   ACETAMINOPHEN LEVEL     Status: Normal   Collection Time   05/15/12  7:08 PM      Component Value Range Comment   Acetaminophen (Tylenol), Serum <15.0  10 - 30 ug/mL   SALICYLATE LEVEL     Status: Abnormal   Collection Time   05/15/12  7:08 PM      Component Value Range Comment   Salicylate Lvl <2.0 (*) 2.8 - 20.0 mg/dL    Review of Systems:  Neurological: The patient denies any headaches today. She denies any seizures or dizziness.  G.I.: The patient denies any constipation or G.I. Upset today.  Musculoskeletal: The patient denies any muscle or skeletal difficulties.   Time was spent today discussing with the patient her current symptoms. The patient reports to sleeping well last night and states she awoke this morning without any sedation.  The patient reports a good appetite and reports mild feelings of sadness, anhedonia and depressed mood.  She adamantly denies any suicidal or homicidal ideations as well as any auditory or visual hallucinations or delusional thinking.  She reports mild anxiety today and states her panic symptoms are under good control.  She denies any medication related side effects or other concerns.    Time was spent today discussing with the patient the possibility of attending IOP at discharge.  The patient agreed with this plan.  Treatment Plan Summary:  1. Daily contact with patient to assess and evaluate symptoms and progress in treatment.  2. Medication management  3. The patient will deny suicidal ideations or homicidal ideations for 48 hours prior to discharge and have a depression and anxiety rating of 3 or less.  The patient will also deny any auditory or visual hallucinations or delusional thinking.  4. The patient will deny any symptoms of substance withdrawal at time of discharge.   Plan:  1. Will continue the medication Celexa at 20 mgs po q am for depression, anxiety and panic symptoms.  2. Will continue the medication Klonopin at 0.5 mgs po q am for anxiety and panic symptoms.  3. Will continue the medication Seroquel at 25 mgs po qhs for anxiety, panic symptoms and mood stabilization.  4. Will continue the patient's oral contraception as she takes at home.  5. Laboratory studies reviewed.  6. Will continue to monitor.  7. Possible discharge tomorrow.  Lyrika Souders 05/17/2012, 4:58 PM

## 2012-05-17 NOTE — Progress Notes (Signed)
Patient up and active in the milieu today.  Attending groups and interacting well with staff and peers.  Pleasant and cooperative this shift with appropriate behavior.  States depression is much less today and she feels more stable.  Denies suicidal ideation at this time.  Appetite good.

## 2012-05-17 NOTE — Progress Notes (Signed)
Pt attended discharge planning group and actively participated.  Pt presents with anxious mood and affect.  Pt rates depression and anxiety at a 2-3 today.  Pt denies SI.  Pt reports feeling much better today.  Pt states she felt her lowest yesterday but feels more stable today.  Pt states she feels the meds she was started on are helping and plans on staying on them.  Pt is open to following up with [facility] and IOP.  Pt agreed to stay one additional day.  No further needs voiced by pt at this time.    Reyes Ivan, LCSWA 05/17/2012  10:25 AM

## 2012-05-17 NOTE — Progress Notes (Signed)
Brief Nutrition Note  Reason: Patient screened at nutrition risk for unintentional weight loss and dysphagia.   Patient reported her appetite and intake are much better now. She reported now that she gets up and going in the morning her appetite is much better. She reported PTA she did not eat until late in the afternoon and that would be her only meal for the day. She denies any problems chewing or swallowing. She voiced her snack preferences. She reported she likes fruit and gold fish.   Wt Readings from Last 10 Encounters:  05/16/12 119 lb (53.978 kg)  05/15/12 115 lb (52.164 kg)   Height: 64" Weight: 119 lb. BMI: 20.4 kg/m^2 (WNL)  Recommend provide patient fruit and goldfish at snack times to increase PO intake.   RD available for nutrition needs.   Iven Finn [facility] [phone]

## 2012-05-17 NOTE — Progress Notes (Signed)
Patient ID: Gina Dorsey, female   DOB: 1991/09/04, 21 y.o.   MRN: [id number]  D: Writer observed pt pacing the hall and asked pt to join her in pt's room. Writer went over report given by previous nurse. Pt stated she went to [facility] because "she needed help". "I start busting out into tears for no reason". Pt was tearful as she spoke. "I get mad really easy". Pt still states she took 100  Xanax, saying, "they didn't even pump my stomach and I'm not sick. It had no affect on me".  Pt got louder stating that she needs to be discharged tomorrow, but she has "super jam tickets". Stated that she and 4 or 5 girlfriends are supposed to ride in a limousine to the concert. After several minutes the pt informed the writer that she hadn't eaten. Writer offered sandwich or salad and both were refused.    A:  Pt went to wrap up group, without incident. Support was offered.  R: Pt responded well and safety was maintained.
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
## 2012-05-18 MED ORDER — LEVONORGESTREL-ETHINYL ESTRAD 0.1-20 MG-MCG PO TABS: 1.0000 | ORAL_TABLET | Freq: Every day | ORAL | Status: DC

## 2012-05-18 MED ORDER — CLONAZEPAM 0.5 MG PO TABS: 0.5000 mg | ORAL_TABLET | Freq: Every day | ORAL | Status: DC

## 2012-05-18 MED ORDER — CITALOPRAM HYDROBROMIDE 20 MG PO TABS: 20.0000 mg | ORAL_TABLET | Freq: Every day | ORAL | Status: DC

## 2012-05-18 MED ORDER — QUETIAPINE FUMARATE 25 MG PO TABS: 25.0000 mg | ORAL_TABLET | Freq: Every day | ORAL | Status: DC

## 2012-05-18 NOTE — [facility] Suicide Risk Assessment (Signed)
Suicide Risk Assessment  Discharge Assessment     Demographic factors:  Caucasian  Current Mental Status Per Nursing Assessment::   On Admission:   At Discharge:  The patient is alert and oriented x 3 and was friendly and cooperative with this provider.  The patient denied any significant feelings of sadness, anhedonia or depressed mood and adamantly denied any suicidal or homicidal ideations.  She also adamantly denied any auditory or visual hallucinations as well as any delusional thinking.  The patient reports some very mild anxiety symptoms and denies any medication side effects.  She also states that she will abstain from any use of alcohol or illicit drugs and will begin IOP at [facility] on Monday.  Current Mental Status Per Physician:  Diagnosis:  Axis I: Bipolar II Disorder - Most Recent Episode - Hypomanic.  Generalized Anxiety Disorder.  Panic Disorder without Agoraphobia.  Cannabis Dependence.  Axis II: Borderline Personality Disorder.   The patient was seen today and reports the following:   ADL's: Intact.  Sleep: The patient reports to sleeping well last night but with some awakenings related to being excited about leaving this morning. Appetite: The patient reports a good appetite today.   Mild>(1-10) >Severe  Hopelessness (1-10): 0  Depression (1-10): 0  Anxiety (1-10): 1   Suicidal Ideation: The patient adamantly denies any suicidal ideations today.  Plan: No  Intent: No  Means: No   Homicidal Ideation: The patient adamantly denies any homicidal ideations today.  Plan: No  Intent: No.  Means: No   General Appearance/Behavior: The patient was friendly and cooperative today with this provider.  Eye Contact: Good.  Speech: Appropriate in rate and volume with no pressuring noted today.  Motor Behavior: wnl.  Level of Consciousness: Alert and Oriented x 3.  Mental Status: Alert and Oriented x 3.  Mood:  Essentially Euthymic.  Affect: Bright and Full.  Anxiety  Level: Very mild anxiety noted today with no panic symptoms.  Thought Process: wnl.  Thought Content: The patient denies any auditory or visual hallucinations today as well as any delusional thinking.  Perception: wnl.  Judgment: Fair to Good.  Insight: Fair to Good.  Cognition: Oriented to person, place and time.  Sleep: Number of Hours: 5.5    Loss Factors: No acute losses noted.  Historical Factors: Long history of mental health issues.  Non-compliance with medications.    Risk Reduction Factors:   Good family support.  Good access to healthcare.  Intelligence.  Continued Clinical Symptoms:  Bipolar Disorder:   Bipolar II Alcohol/Substance Abuse/Dependencies Personality Disorders:   Cluster B Comorbid alcohol abuse/dependence Comorbid depression More than one psychiatric diagnosis Unstable or Poor Therapeutic Relationship Previous Psychiatric Diagnoses and Treatments  Discharge Diagnoses:   AXIS I:   Bipolar II Disorder - Most Recent Episode - Hypomanic.    Generalized Anxiety Disorder.    Panic Disorder without Agoraphobia.    Cannabis Dependence.  AXIS II:   Borderline Personality Disorder.  AXIS III:   1.  Bilateral Posterior Subcapsular Polar Cataract.   2.  Irritable Bowel Syndrome. AXIS IV:   Chronic Mental Illness.  Family Conflict.  Unemployment. AXIS V:   GAF at time of admission approximately 35.  GAF at time of discharge approximately 65.  Cognitive Features That Contribute To Risk:  None Noted.   Current Medications:  Current Facility-Administered Medications   Medication  Dose  Route  Frequency  Provider  Last Rate  Last Dose   .  acetaminophen (TYLENOL)  tablet 650 mg  650 mg  Oral  Q6H PRN  Wonda Cerise, MD     .  alum & mag hydroxide-simeth (MAALOX/MYLANTA) 200-200-20 MG/5ML suspension 30 mL  30 mL  Oral  Q4H PRN  Wonda Cerise, MD     .  citalopram (CELEXA) tablet 20 mg  20 mg  Oral  Q breakfast  Curlene Labrum Aloys Hupfer, MD   20 mg at 05/16/12 1547   .   clonazePAM (KLONOPIN) tablet 0.5 mg  0.5 mg  Oral  Q breakfast  Curlene Labrum Jacquetta Polhamus, MD   0.5 mg at 05/17/12 0805   .  hydrOXYzine (ATARAX/VISTARIL) tablet 25 mg  25 mg  Oral  Q6H PRN  Mike Craze, MD   25 mg at 05/16/12 2223   .  levonorgestrel-ethinyl estradiol (AVIANE,ALESSE,LESSINA) 0.1-20 MG-MCG per tablet 1 tablet  1 tablet  Oral  Daily  Ronny Bacon, MD   1 tablet at 05/17/12 0805   .  magnesium hydroxide (MILK OF MAGNESIA) suspension 30 mL  30 mL  Oral  Daily PRN  Wonda Cerise, MD     .  QUEtiapine (SEROQUEL) tablet 25 mg  25 mg  Oral  QHS  Ronny Bacon, MD     .  DISCONTD: hydrOXYzine (ATARAX/VISTARIL) tablet 25 mg  25 mg  Oral  Q6H PRN  Mike Craze, MD     .  DISCONTD: QUEtiapine (SEROQUEL) tablet 25 mg  25 mg  Oral  QHS  Ronny Bacon, MD     .  DISCONTD: QUEtiapine (SEROQUEL) tablet 50 mg  50 mg  Oral  QHS  Mike Craze, MD     .  DISCONTD: QUEtiapine (SEROQUEL) tablet 50 mg  50 mg  Oral  QHS  Mike Craze, MD      Lab Results:  Results for orders placed during the hospital encounter of 05/15/12 (from the past 48 hour(s))   URINALYSIS, ROUTINE W REFLEX MICROSCOPIC Status: Normal    Collection Time    05/15/12 6:27 PM   Component  Value  Range  Comment    Color, Urine  YELLOW  YELLOW     APPearance  CLEAR  CLEAR     Specific Gravity, Urine  1.010  1.005 - 1.030     pH  7.5  5.0 - 8.0     Glucose, UA  NEGATIVE  NEGATIVE mg/dL     Hgb urine dipstick  NEGATIVE  NEGATIVE     Bilirubin Urine  NEGATIVE  NEGATIVE     Ketones, ur  NEGATIVE  NEGATIVE mg/dL     Protein, ur  NEGATIVE  NEGATIVE mg/dL     Urobilinogen, UA  0.2  0.0 - 1.0 mg/dL     Nitrite  NEGATIVE  NEGATIVE     Leukocytes, UA  NEGATIVE  NEGATIVE  MICROSCOPIC NOT DONE ON URINES WITH NEGATIVE PROTEIN, BLOOD, LEUKOCYTES, NITRITE, OR GLUCOSE <1000 mg/dL.   PREGNANCY, URINE Status: Normal    Collection Time    05/15/12 6:27 PM   Component  Value  Range  Comment    Preg Test, Ur  NEGATIVE  NEGATIVE    URINE  RAPID DRUG SCREEN (HOSP PERFORMED) Status: Abnormal    Collection Time    05/15/12 6:27 PM   Component  Value  Range  Comment    Opiates  NONE DETECTED  NONE DETECTED     Cocaine  NONE DETECTED  NONE DETECTED     Benzodiazepines  NONE DETECTED  NONE DETECTED     Amphetamines  NONE DETECTED  NONE DETECTED     Tetrahydrocannabinol  POSITIVE (*)  NONE DETECTED     Barbiturates  NONE DETECTED  NONE DETECTED    CBC Status: Normal    Collection Time    05/15/12 6:27 PM   Component  Value  Range  Comment    WBC  8.5  4.0 - 10.5 K/uL     RBC  4.41  3.87 - 5.11 MIL/uL     Hemoglobin  13.6  12.0 - 15.0 g/dL     HCT  16.1  09.6 - 04.5 %     MCV  90.7  78.0 - 100.0 fL     MCH  30.8  26.0 - 34.0 pg     MCHC  34.0  30.0 - 36.0 g/dL     RDW  40.9  81.1 - 91.4 %     Platelets  224  150 - 400 K/uL    DIFFERENTIAL Status: Normal    Collection Time    05/15/12 6:27 PM   Component  Value  Range  Comment    Neutrophils Relative  61  43 - 77 %     Neutro Abs  5.2  1.7 - 7.7 K/uL     Lymphocytes Relative  33  12 - 46 %     Lymphs Abs  2.8  0.7 - 4.0 K/uL     Monocytes Relative  5  3 - 12 %     Monocytes Absolute  0.5  0.1 - 1.0 K/uL     Eosinophils Relative  1  0 - 5 %     Eosinophils Absolute  0.1  0.0 - 0.7 K/uL     Basophils Relative  0  0 - 1 %     Basophils Absolute  0.0  0.0 - 0.1 K/uL    COMPREHENSIVE METABOLIC PANEL Status: Abnormal    Collection Time    05/15/12 6:27 PM   Component  Value  Range  Comment    Sodium  138  135 - 145 mEq/L     Potassium  3.6  3.5 - 5.1 mEq/L     Chloride  102  96 - 112 mEq/L     CO2  24  19 - 32 mEq/L     Glucose, Bld  101 (*)  70 - 99 mg/dL     BUN  7  6 - 23 mg/dL     Creatinine, Ser  7.82  0.50 - 1.10 mg/dL     Calcium  9.7  8.4 - 10.5 mg/dL     Total Protein  7.2  6.0 - 8.3 g/dL     Albumin  4.2  3.5 - 5.2 g/dL     AST  13  0 - 37 U/L     ALT  8  0 - 35 U/L     Alkaline Phosphatase  82  39 - 117 U/L     Total Bilirubin  0.2 (*)  0.3 - 1.2 mg/dL       GFR calc non Af Amer  >90  >90 mL/min     GFR calc Af Amer  >90  >90 mL/min    ETHANOL Status: Normal    Collection Time    05/15/12 6:27 PM   Component  Value  Range  Comment    Alcohol, Ethyl (B)  <11  0 - 11 mg/dL    ACETAMINOPHEN  LEVEL Status: Normal    Collection Time    05/15/12 7:08 PM   Component  Value  Range  Comment    Acetaminophen (Tylenol), Serum  <15.0  10 - 30 ug/mL    SALICYLATE LEVEL Status: Abnormal    Collection Time    05/15/12 7:08 PM   Component  Value  Range  Comment    Salicylate Lvl  <2.0 (*)  2.8 - 20.0 mg/dL     Review of Systems:  Neurological: The patient denies any headaches today. She denies any seizures or dizziness.  G.I.: The patient denies any constipation or G.I. Upset today.  Musculoskeletal: The patient denies any muscle or skeletal difficulties.   The patient is alert and oriented x 3 and remained friendly and cooperative with this provider.  The patient denied any significant feelings of sadness, anhedonia or depressed mood and adamantly denied any suicidal or homicidal ideations.  She also adamantly denied any auditory or visual hallucinations as well as any delusional thinking.  The patient reports some very mild anxiety symptoms and denies any medication side effects.  She also states that she will abstain from any use of alcohol or illicit drugs and will begin IOP at [facility] on Monday.  The patient will be discharged today to outpatient follow up.  Treatment Plan Summary:  1. Daily contact with patient to assess and evaluate symptoms and progress in treatment.  2. Medication management  3. The patient will deny suicidal ideations or homicidal ideations for 48 hours prior to discharge and have a depression and anxiety rating of 3 or less. The patient will also deny any auditory or visual hallucinations or delusional thinking.  4. The patient will deny any symptoms of substance withdrawal at time of discharge.   Plan:  1. Will continue the  medication Celexa at 20 mgs po q am for depression, anxiety and panic symptoms.  2. Will continue the medication Klonopin at 0.5 mgs po q am for anxiety and panic symptoms.  3. Will continue the medication Seroquel at 25 mgs po qhs for anxiety, panic symptoms and mood stabilization.  4. Will continue the patient's oral contraception as she takes at home.  5. Laboratory studies reviewed.  6. Will continue to monitor.  7. Discharge today to outpatient follow up.  Suicide Risk:  Minimal: No identifiable suicidal ideation.  Patients presenting with no risk factors but with morbid ruminations; may be classified as minimal risk based on the severity of the depressive symptoms  Plan Of Care/Follow-up recommendations:  Activity:  As tolerated. Diet:  Regular Diet. Other:  Please take all medications only as directed and keep all scheduled follow up appointments including starting IOP on Monday.  Lexa Coronado 05/18/2012, 10:16 AM

## 2012-05-18 NOTE — Progress Notes (Signed)
Nursing Discharge Note:  D:  Patient discharged home per MD order.  A: patient received all medications and prescriptions.  She refused the celexa and seroquel and states she will only take the clonopin.  I advised her to follow up with her doctor with further medication changes.  She will be following up with [facility] on 6/25.  Patient was pleasant and cooperative.  She states that she has concert ticket for tonight and it might not be in her best interests to go as she "might end up in jail."  She was receptive to the idea of giving the tickets to her friends.  She denies any SI/HI/AVH.  Her family is very supportive of her and vested in her treatment.  R: patient was receptive to all recommendations and instructions.

## 2012-05-18 NOTE — Tx Team (Signed)
Date: 05/18/2012  Time Reviewed: 10:05 AM   Progress in Treatment:  Attending groups: Yes  Participating in groups: Yes  Taking medication as prescribed: Yes  Tolerating medication: Yes  Family/Significant other contact made: Counselor will make appropriate contact  Patient understands diagnosis: Yes  Discussing patient identified problems/goals with staff: Yes  Medical problems stabilized or resolved: Yes  Denies suicidal/homicidal ideation: Yes  Issues/concerns per patient self-inventory: None identified  Other: N/A   New problem(s) identified: None Identified   Reason for Continuation of Hospitalization:   Interventions implemented related to continuation of hospitalization: mood stabilization, medication monitoring and adjustment, group therapy and psycho education, safety checks q 15 mins   Additional comments: N/A   Estimated length of stay: 1 day   Discharge Plan: Pt will follow up with [facility] for IOP and outpt services   New goal(s): N/A   Review of initial/current patient goals per problem list:   1. Goal(s): Reduce depressive symptoms  Met: No  Target date: by discharge  As evidenced by: Reducing depression from a 10 to a 0 as reported by pt.   2. Goal (s): Reduce/Eliminate suicidal ideation  Met: No  Target date: by discharge  As evidenced by: pt reporting no SI.   3. Goal(s): Reduce anxiety symptoms  Met: No  Target date: by discharge  As evidenced by: Reduce anxiety from a 10 to a 1 as reported by pt.   Attendees:   Patient: Gina Dorsey  05/18/2012  10:08 AM   Family:    Physician: Franchot Gallo, MD  05/18/2012 10:09 AM  Nursing: Alease Frame, RN  05/18/2012 10:09 AM  Case Manager: Juline Patch, LCSW 05/18/2012 10:10 AM  Counselor: Angus Palms, LCSW  05/18/2012 10:10 AM  Other: Juline Patch, LCSW          Other:    Scribe for Treatment Team:   Juline Patch, LCSW

## 2012-05-18 NOTE — Progress Notes (Signed)
Psychoeducational Group Note  Date:  05/17/2012 Time:  2130  Group Topic/Focus:  Wrap-Up Group:   The focus of this group is to help patients review their daily goal of treatment and discuss progress on daily workbooks.  Participation Level:  Active  Participation Quality:  Appropriate, Redirectable and Resistant  Affect:  Anxious and Irritable  Cognitive:  Appropriate and Oriented  Insight:  Good  Engagement in Group:  Good  Additional Comments:  Pt attended wrap group tonight.  Pt attempted to participate in karoke and got upset at no environment ques to start singing.  Pt taken back to unit early due to aggrivation and crying spell. Pt said,"  Karoke staff told me I had an attitude."  Pt expressed anxiety over getting in front of peers.  Praised pt for trying karoke in front of peers and using coping skills when she got upset.  Reported to RN.  Gina Dorsey, Adajah Cocking L 05/18/2012, 3:12 AM

## 2012-05-18 NOTE — Progress Notes (Signed)
[facility] Case Management Discharge Plan:  Will you be returning to the same living situation after discharge: Yes,  Patient will return to her home At discharge, do you have transportation home?:Yes,  Patient to arrange for parents to transport her home Do you have the ability to pay for your medications:No.  Patient to be assisted with indigent medications  Interagency Information:     Release of information consent forms completed and in the chart;  Patient's signature needed at discharge.  Patient to Follow up at:  Follow-up Information    Follow up with [facility] - IOP on 05/21/2012. (Arrive at 8:45 am to start IOP on this date)    Contact information:   [address] [place], [place] [address] [phone]      Follow up with [facility] - Outpatient on 06/21/2012. (Appointment scheduled at 10:30 am with [facility] for therapy)    Contact information:   [address] [place], [place] [address] [phone]      Follow up with [facility] on 06/19/2012. (Appointment scheduled at 2:30 pm with Jorje Guild, PA  Have paperwork filled out before appointment!)    Contact information:   [address] [place], [place] [address] [phone]         Patient denies SI/HI:   Yes,  Patient no longer endorsing SI or other thoughts of self harm    Safety Planning and Suicide Prevention discussed:  Yes,  Reviewed during discharge planning group  Barrier to discharge identified:No.  Summary and Recommendations: Patient encourage to be compliant with medications and follow up with outpatient recommedations    Gina Dorsey, [place] July 05/18/2012, 10:11 AM

## 2012-05-18 NOTE — Progress Notes (Signed)
[facility]ina [facility]sey Adult Inpatient Family/Significant Other Suicide Prevention Education  Suicide Prevention Education:  Education Completed; Gina Dorsey, mother (face to face)  has been identified by the patient as the family member/significant other with whom the patient will be residing, and identified as the person(s) who will aid the patient in the event of a mental health crisis (suicidal ideations/suicide attempt).  With written consent from the patient, the family member/significant other has been provided the following suicide prevention education, prior to the and/or following the discharge of the patient.  The suicide prevention education provided includes the following:  Suicide risk factors  Suicide prevention and interventions  National Suicide Hotline telephone number  [facility] assessment telephone number  [facility] Emergency Assistance 911  [facility] and/or Residential Mobile Crisis Unit telephone number  Request made of family/significant other to:  Remove weapons (e.g., guns, rifles, knives), all items previously/currently identified as safety concern.    Remove drugs/medications (over-the-counter, prescriptions, illicit drugs), all items previously/currently identified as a safety concern.  Gina Dorsey will be living back and forth between her mother and father. Mother stated that she has been through crises with Gina Dorsey before and had no safety concerns about her discharge. She reported that Gina Dorsey has no aces to firearms and verbalized understanding of suicide prevention information. She had no further questions, but counselor encouraged her to contact counselor if she has questions in the next few days.  Gina Dorsey 05/18/2012, 12:21 PM

## 2012-05-18 NOTE — Progress Notes (Signed)
Patient ID: Gina Dorsey, female   DOB: 1991/07/30, 21 y.o.   MRN: [id number] Has been out and about on hall this evening, attended group [facility]) but became upset when she tried to sing and it didn't go well.  Was brought back to unit, tearful, had been told she had an attitude.  Short time after this, flippantly stated she wasn't taking seroquel, she was done with that and didn't need it.  Was sitting next to a young female peer, watching sports on tv.  Will continue to monitor q 15 minutes for safety.

## 2012-05-21 NOTE — Progress Notes (Signed)
Patient Discharge Instructions:  After Visit Summary (AVS):   Access to EMR:  05/21/2012 Psychiatric Admission Assessment Note:   Access to EMR:  05/21/2012 Suicide Risk Assessment - Discharge Assessment:   Access to EMR:  05/21/2012 Next Level Care Provider Has Access to the EMR, 05/21/2012  Records provided to [facility] Jorje Guild, PA; Forde Radon, Therapist; and [facility] IOP via [facility]/E[facility] access.  Wandra Scot, 05/21/2012, 11:41 AM

## 2012-05-24 ENCOUNTER — Encounter (HOSPITAL_COMMUNITY): Payer: Self-pay | Admitting: Psychology

## 2012-05-24 ENCOUNTER — Ambulatory Visit (INDEPENDENT_AMBULATORY_CARE_PROVIDER_SITE_OTHER): Payer: 59 | Admitting: Psychology

## 2012-05-24 DIAGNOSIS — F1220 Cannabis dependence, uncomplicated: Secondary | ICD-10-CM

## 2012-05-24 DIAGNOSIS — F39 Unspecified mood [affective] disorder: Secondary | ICD-10-CM

## 2012-05-24 NOTE — Progress Notes (Signed)
Patient:   Gina Dorsey   DOB:   March 19, 1991  MR Number:  [id number]  Location:  [facility] [address] [place] [address] Dept: [phone]           Date of Service:   05/24/12  Start Time:   1:10pm End Time:   2:15pm  Provider/Observer:  Forde Radon [facility]       Billing Code/Service: [phone]  Chief Complaint:     Chief Complaint  Patient presents with  . Anxiety  . Emotional Lability  . Stress    Reason for Service:  Pt reported that she was referred following inpt tx from 05/16/12 to 05/18/12 after attempted suicide by taking "half of dad's Xanax".  Pt reported she has had hx of inpt at age 43 or 21y/o.  Pt reports she has had a "very long hx depression,  violent mood swings, and serious anxiety problems" beginning in teen years.  Pt reports she also struggles w/ self esteem issues. Pt reports that her major stressors are financial struggles for herself and dad (who supporting her financially).  Pt reports she avoids going a lot of places b/c of anxiety and mood swings. Pt reports daily use of marijuana usually morning and night as self medicating.  Pt reports long hx of tried on several medications w/out any improvement.  Pt denies having a chemically dependence issues.  Pt reports that she did have significant SA issues as a teenager.  Pt reports that no current pending charges and all marijuana related charges form a year ago dropped.    Current Status:  Pt reports no problems w/ sleep going to be around 11:30am-9-10am. Pt reports not really doing anything currently as not been earning income for past 4 weeks. Pt reports she has been going to the pool at [employer] apartment. Pt denies any SI or self harm since d/c from inpt tx.  Pt reports daily mood swings w/ tearfulness, severe anxiety, agitation and not wanting to be around anyone.   Reliability of Information: Pt provided all information and counselor  reviewed referral information from inpt tx.   Behavioral Observation: JAYLENN BAIZA  presents as a 21 y.o.-year-old  Caucasian Female who appeared her stated age. her dress was Appropriate and she was Casual and Well Groomed and her manners were Appropriate to the situation.  There were not any physical disabilities noted.  she displayed an appropriate level of cooperation and motivation for outpt counseling.  Pt reported no intention for ceasing drug use although acknowledged may be impacting moods, self medicating and medication effectiveness.    Interactions:    Active   Attention:   normal  Memory:   within normal limits  Visuo-spatial:   not examined  Speech (Volume):  normal  Speech:   Pt slightly pressured in speech and talkative  Thought Process:  Coherent and Relevant  Though Content:  WNL  Orientation:   person, place, time/date and situation  Judgment:   Fair  Planning:   Fair  Affect:    Anxious and Labile  Mood:    Anxious, Depressed and Irritable  Insight:   Lacking  Intelligence:   normal  Marital Status/Living: Pt reports biggest supports dad and 70yr best friend [place], who she reports is like a little sister- still in H.S.     Current Employment: Currently not employed.    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 Pt reported doesn't want to return to this for money, but at times feels only way she can earn money.  Substance Use:  There is a documented history of marijuana abuse confirmed by the patient.  Pt reports that she uses marijuana usually 2 a day.  Pt reports a gram of high grade/potency.  Pt has been smoking marijuana for 4-5 years.  Pt reports use of drugs and alcohol heavily when an adolescent.   Education:   dropped out of HS 10th grade.  had been using a lot of drugs at that time. wants to sign up for fall semester GED program.    Medical History:   Past Medical History  Diagnosis Date  .  Bilateral posterior subcapsular polar cataract   . Anxiety   . IBS (irritable bowel syndrome)     2008        Outpatient Encounter Prescriptions as of 05/24/2012  Medication Sig Dispense Refill  . clonazePAM (KLONOPIN) 0.5 MG tablet Take 1 tablet (0.5 mg total) by mouth daily with breakfast. For anxiety and panic symptoms.  30 tablet  0  . levonorgestrel-ethinyl estradiol (LUTERA) 0.1-20 MG-MCG tablet Take 1 tablet by mouth daily. For contraception.  1 Package  0  . citalopram (CELEXA) 20 MG tablet Take 1 tablet (20 mg total) by mouth daily with breakfast. For Depression.  30 tablet  0  . QUEtiapine (SEROQUEL) 25 MG tablet Take 1 tablet (25 mg total) by mouth at bedtime. For mood stabilization.  30 tablet  0        Pt reported that she didn't take Celexa as had a rash/hive reaction to on inpt unit.  Pt reported she refused to take seroquel as doesn't have a sleeping problem.  Pt reported she is taking the Klonopin and her birthcontrol pill.  Sexual History:   History  Sexual Activity  . Sexually Active: Not Currently  . 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    Psychiatric History:  Pt reports that she has seen many counselors and  psychiartist in past and nothing has been beneficial.  Pt reported that she had been dx ADHD, Bipolar D/O and Anxiety D/O in past.  Pt never had an SA counseling. Pt also reported at age 35 and 21y/o sent to [facility].   Family Med/Psych History: History reviewed. No pertinent family history.  Risk of Suicide/Violence: low Pt reports no SI since being out of the hospital.  Impression/DX:  Pt is a 20y/o female who reports seeking tx for depression, anxiety and mood swings.  Pt most recent tx inpt last week following overdose on Xanax as suicide attempt.  Pt denies any current SI and no self harm since d/c.  Pt reports long hx of mood  instability.  Pt reports daily use of marijuana for self medicating and past excessive use of alcohol and drugs in adolescents.  Pt poor insight into effects of recent drug use. Pt hesitant to comply w/ physician's orders while inpt and currently only taking Klonopin that was prescribed.  Pt is unemployed and didn't complete high school.  Pt is seeking counseling, wants IOP program, but not committed to stopping drug use.    Disposition/Plan:  Assisted pt in meeting w/ Psyc IOP [facility] Jeri Modena who informed that not an appropriate fit w/ program w/ SA.  CD IOP was disucssed as  appropriate referral and requirements for abstinence if entered program.  Pt agrees to further consider this program and inform outpt center of her decision.  Pt agreed to keep her appointment scheduled w/ Jorje Guild for 06/19/12.  Pt to f/u for one individual session to assist w/ referral to more appropriate provider for outpt counseling.   Diagnosis:    Axis I:   1. Unspecified episodic mood disorder   2. Cannabis dependence         Axis II: Deferred       Axis III:  IBS      Axis IV:  economic problems, educational problems, problems related to social environment and problems with primary support group          Axis V:  41-50 serious symptoms

## 2012-05-28 NOTE — Discharge Summary (Signed)
Physician Discharge Summary Note  Patient:  Gina Dorsey is an 21 y.o., female MRN:  [id number] DOB:  1991/03/07 Patient phone:  [phone] (home)  Patient address:   [address] [place] [address],   Date of Admission:  05/16/2012 Date of Discharge: 05/18/2012  Reason for Admission: Overdose of xanax  Discharge Diagnoses: Principal Problem:  *Bipolar II disorder, most recent episode hypomanic Active Problems:  Cannabis dependence  Borderline personality disorder  Generalized anxiety disorder  Panic disorder without agoraphobia   Axis Diagnosis:  Discharge Diagnoses:  AXIS I: Bipolar II Disorder - Most Recent Episode - Hypomanic.  Generalized Anxiety Disorder.  Panic Disorder without Agoraphobia.  Cannabis Dependence.  AXIS II: Borderline Personality Disorder.  AXIS III: 1. Bilateral Posterior Subcapsular Polar Cataract.  2. Irritable Bowel Syndrome.  AXIS IV: Chronic Mental Illness. Family Conflict. Unemployment.  AXIS V: GAF at time of admission approximately 35. GAF at time of discharge approximately 65.     Level of Care:  CD-IOP  Hospital Course:  Tana was admitted to [facility] after reporting an intentional overdose of "36 Xanax" due to being angry and impulsive.  The UDS in the ED was negative for Benzodiazepines and her vital signs did not correlate with her reported history.  She was involuntarily admitted to [facility] and later agreed to sign in voluntarily.  Her behaviors were labile and erratic and changed quite quickly when she felt that she was getting her way, and would often resolve very quickly when she was distracted or interested in her surroundings.  Jammie stated that she had a severe problem with social phobia which she self medicated with daily by smoking a 100 $ worth of marijuana.  She reported that she has done this for many years.  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     Raea was focused on leaving the hospital after only a day or two due to an up coming concert for which she had spent 500.00 for tickets and would not get that money back.  She reported increased spending, road rage, racing thoughts, rapid mood changes, extreme irritability and rage on impulse. She showed poor judgement and limited insight.  She was compliant with medication while in the hospital and upon her discharge had agreed to go to the CD-IOP on Monday.  Areatha also agreed to avoid alcohol an drugs over the weekend and would follow up on Monday.  Consults:  none  Significant Diagnostic Studies:  none  Discharge Vitals:   Blood pressure 152/107, pulse 81, temperature 98.2 F (36.8 C), temperature source Oral, resp. rate 16, height 5\' 4"  (1.626 m), weight 53.978 kg (119 lb), last menstrual period 05/15/2012.  Mental Status Exam: See Mental Status Examination and Suicide Risk Assessment completed by Attending Physician prior to discharge.  Discharge destination:  home Is patient on multiple antipsychotic therapies at discharge:  No   Has Patient had three or more failed trials of antipsychotic monotherapy by history:  No Recommended Plan for Multiple Antipsychotic Therapies: not applicable  Discharge Orders    Future Orders Please Complete By Expires   Diet - low sodium heart healthy      Increase activity slowly      Discharge instructions      Comments:   Please take all medications only as directed and keep all scheduled follow up appointments.  Please do not use any alcohol or illicit drugs and remember to start Intensive Outpatient Program at [facility] on Monday.     Medication List  As of 05/28/2012 11:47 PM   TAKE these medications      Indication    citalopram 20 MG tablet   Commonly known as: CELEXA   Take 1 tablet (20 mg total) by mouth daily with breakfast. For Depression.       clonazePAM 0.5 MG tablet   Commonly known as: KLONOPIN    Take 1 tablet (0.5 mg total) by mouth daily with breakfast. For anxiety and panic symptoms.       levonorgestrel-ethinyl estradiol 0.1-20 MG-MCG tablet   Commonly known as: AVIANE,ALESSE,LESSINA   Take 1 tablet by mouth daily. For contraception.       QUEtiapine 25 MG tablet   Commonly known as: SEROQUEL   Take 1 tablet (25 mg total) by mouth at bedtime. For mood stabilization.            Follow-up Information    Follow up with [facility] - IOP on 05/21/2012. (Arrive at 8:45 am to start IOP on this date)    Contact information:   [address] [place], [place] [address] [phone]      Follow up with [facility] - Outpatient on 06/21/2012. (Appointment scheduled at 10:30 am with [facility] for therapy)    Contact information:   [address] [place], [place] [address] [phone]      Follow up with [facility] on 06/19/2012. (Appointment scheduled at 2:30 pm with Jorje Guild, PA  Have paperwork filled out before appointment!)    Contact information:   [address] [place], [place] [address] [phone]         Follow-up recommendations:  Heart healthy diet, regular exercise, plenty of rest, avoid all alcohol and drugs.  Comments:    Signed: Lloyd Huger T. Anayia Eugene  PAC For Dr. Harvie Heck D. Readling 05/28/2012, 11:47 PM

## 2012-06-01 ENCOUNTER — Ambulatory Visit (HOSPITAL_COMMUNITY): Payer: Self-pay | Admitting: Psychology

## 2012-06-12 ENCOUNTER — Encounter (HOSPITAL_BASED_OUTPATIENT_CLINIC_OR_DEPARTMENT_OTHER): Payer: Self-pay | Admitting: *Deleted

## 2012-06-12 ENCOUNTER — Emergency Department (HOSPITAL_BASED_OUTPATIENT_CLINIC_OR_DEPARTMENT_OTHER)
Admission: EM | Admit: 2012-06-12 | Discharge: 2012-06-12 | Disposition: A | Discharge status: Home / Self Care | Payer: 59 | Attending: Emergency Medicine | Admitting: Emergency Medicine

## 2012-06-12 DIAGNOSIS — T7840XA Allergy, unspecified, initial encounter: Secondary | ICD-10-CM

## 2012-06-12 DIAGNOSIS — F411 Generalized anxiety disorder: Secondary | ICD-10-CM | POA: Insufficient documentation

## 2012-06-12 DIAGNOSIS — R21 Rash and other nonspecific skin eruption: Secondary | ICD-10-CM | POA: Insufficient documentation

## 2012-06-12 DIAGNOSIS — K589 Irritable bowel syndrome without diarrhea: Secondary | ICD-10-CM | POA: Insufficient documentation

## 2012-06-12 DIAGNOSIS — F17200 Nicotine dependence, unspecified, uncomplicated: Secondary | ICD-10-CM | POA: Insufficient documentation

## 2012-06-12 LAB — RAPID STREP SCREEN (MED CTR MEBANE ONLY): Streptococcus, Group A Screen (Direct): NEGATIVE

## 2012-06-12 MED ORDER — PREDNISONE 50 MG PO TABS
60.0000 mg | ORAL_TABLET | Freq: Once | ORAL | Status: AC
  Administered 2012-06-12: 60 mg via ORAL
  Filled 2012-06-12: qty 1

## 2012-06-12 MED ORDER — PREDNISONE 20 MG PO TABS: 60.0000 mg | ORAL_TABLET | Freq: Every day | ORAL | Status: DC

## 2012-06-12 MED ORDER — ALBUTEROL SULFATE HFA 108 (90 BASE) MCG/ACT IN AERS
2.0000 | INHALATION_SPRAY | RESPIRATORY_TRACT | Status: DC | PRN
  Administered 2012-06-12: 2 via RESPIRATORY_TRACT
  Filled 2012-06-12: qty 6.7

## 2012-06-12 MED ORDER — ALBUTEROL SULFATE (5 MG/ML) 0.5% IN NEBU
5.0000 mg | INHALATION_SOLUTION | Freq: Once | RESPIRATORY_TRACT | Status: AC
  Administered 2012-06-12: 5 mg via RESPIRATORY_TRACT
  Filled 2012-06-12: qty 1

## 2012-06-12 NOTE — ED Notes (Signed)
Pt states she came in contact with poison sumac 4 days ago and she broke out in rash over whole body used "ivy wash" over the counter x 3 rash went away but over last 12 hours patient is having feeling of itchy throat and tongue feeling"thick" has taken benadryl several times last dose of 50 mg within 15 minutes

## 2012-06-12 NOTE — ED Provider Notes (Signed)
History     CSN: [id number]  Arrival date & time 06/12/12  1000   First MD Initiated Contact with Patient 06/12/12 1024      Chief Complaint  Patient presents with  . Allergic Reaction    (Consider location/radiation/quality/duration/timing/severity/associated sxs/prior treatment) Patient is a 21 y.o. female presenting with allergic reaction. The history is provided by the patient.  Allergic Reaction  She says that she was exposed to poison sumac 2 days ago, and that night she started breaking out in a rash in the back of her neck. She washed off with IV watch several times and on the rash has gone away. She describes a burning sensation where the rash was. This morning, she started having some difficulty breathing and wheezing. She denies fever, chills, sweats. She denies nausea or vomiting. She has taken Benadryl which hasn't helped the rash go away.  Past Medical History  Diagnosis Date  . Bilateral posterior subcapsular polar cataract   . Anxiety   . IBS (irritable bowel syndrome)     2008    Past Surgical History  Procedure Date  . Foot fracture surgery     History reviewed. No pertinent family history.  History  Substance Use Topics  . Smoking status: Current Everyday Smoker -- 0.5 packs/day for 3 years    Types: Cigarettes  . Smokeless tobacco: Not on file  . Alcohol Use: No    OB History    Grav Para Term Preterm Abortions TAB SAB Ect Mult Living                  Review of Systems  All other systems reviewed and are negative.    Allergies  Morphine and related; Wellbutrin; and Celexa  Home Medications   Current Outpatient Rx  Name Route Sig Dispense Refill  . CITALOPRAM HYDROBROMIDE 20 MG PO TABS Oral Take 1 tablet (20 mg total) by mouth daily with breakfast. For Depression. 30 tablet 0  . CLONAZEPAM 0.5 MG PO TABS Oral Take 1 tablet (0.5 mg total) by mouth daily with breakfast. For anxiety and panic symptoms. 30 tablet 0  .  LEVONORGESTREL-ETHINYL ESTRAD 0.1-20 MG-MCG PO TABS Oral Take 1 tablet by mouth daily. For contraception. 1 Package 0  . QUETIAPINE FUMARATE 25 MG PO TABS Oral Take 1 tablet (25 mg total) by mouth at bedtime. For mood stabilization. 30 tablet 0    BP 126/77  Pulse 78  Temp 97.8 F (36.6 C) (Oral)  Resp 18  Ht 5\' 4"  (1.626 m)  Wt 115 lb (52.164 kg)  BMI 19.74 kg/m2  SpO2 100%  LMP 04/24/2012  Physical Exam  Nursing note and vitals reviewed.  21 year old female who is resting comfortably and in no acute distress. Vital signs are normal. Oxygen saturation is 100% which is normal. Head is normocephalic and atraumatic. PERRLA, EOMI. Oropharynx shows moderate erythema with slight edema of the uvula. There is no exudate present. She has no difficulty with secretions, but voices worse. Neck is nontender and supple without adenopathy or stridor. Lungs are clear without rales, wheezes, rhonchi him. There is a slight prolongation of exhalation phase. Heart has regular rate and rhythm without murmur. Abdomen is soft, flat, nontender without masses or hepatosplenomegaly. Extremities have no cyanosis or edema, full range of motion is present. Scars are present on the left forearm a pattern suggesting previous self-mutilation. Skin is warm and dry without other rash. There is no skin finding typical of poison ivy/poison sumac dermatitis. Neurologic: Mental  status is normal, cranial nerves are intact, there are no motor or sensory deficits.  ED Course  Procedures (including critical care time)  Results for orders placed during the hospital encounter of 06/12/12  RAPID STREP SCREEN      Component Value Range   Streptococcus, Group A Screen (Direct) NEGATIVE  NEGATIVE    1. Allergic reaction       MDM  Probable allergic reaction. I doubt poison ivy type dermatitis since I do not see any rash present. However, her boyfriend who was also had the same exposure has a more typical rash of poison ivy. She  was given a breathing treatment with albuterol and feels much better. She was also given oral prednisone. Beta strep screen was obtained to rule out strep and strep associated symptoms and this is come back negative. She'll be sent home with prescription for prednisone and she will be treated for 2 weeks just to be sure that poison ivy type dermatitis would be appropriately covered. She's encouraged to stop smoking. She is told to take over-the-counter Claritin instead of Benadryl.        Dione Booze, MD 06/12/12 [phone]

## 2012-06-19 ENCOUNTER — Ambulatory Visit (HOSPITAL_COMMUNITY): Payer: Self-pay | Admitting: Physician Assistant

## 2012-06-21 ENCOUNTER — Ambulatory Visit (HOSPITAL_COMMUNITY): Payer: Self-pay | Admitting: Psychology

## 2012-07-13 ENCOUNTER — Emergency Department (HOSPITAL_BASED_OUTPATIENT_CLINIC_OR_DEPARTMENT_OTHER)
Admission: EM | Admit: 2012-07-13 | Discharge: 2012-07-13 | Disposition: A | Discharge status: Home / Self Care | Payer: 59 | Attending: Emergency Medicine | Admitting: Emergency Medicine

## 2012-07-13 ENCOUNTER — Encounter (HOSPITAL_BASED_OUTPATIENT_CLINIC_OR_DEPARTMENT_OTHER): Payer: Self-pay | Admitting: Emergency Medicine

## 2012-07-13 DIAGNOSIS — A749 Chlamydial infection, unspecified: Secondary | ICD-10-CM

## 2012-07-13 DIAGNOSIS — R109 Unspecified abdominal pain: Secondary | ICD-10-CM | POA: Insufficient documentation

## 2012-07-13 DIAGNOSIS — F17200 Nicotine dependence, unspecified, uncomplicated: Secondary | ICD-10-CM | POA: Insufficient documentation

## 2012-07-13 LAB — GC/CHLAMYDIA PROBE AMP, GENITAL
Chlamydia, DNA Probe: NEGATIVE
GC Probe Amp, Genital: NEGATIVE

## 2012-07-13 LAB — URINALYSIS, ROUTINE W REFLEX MICROSCOPIC
Bilirubin Urine: NEGATIVE
Glucose, UA: NEGATIVE mg/dL
Hgb urine dipstick: NEGATIVE
Ketones, ur: NEGATIVE mg/dL
Leukocytes, UA: NEGATIVE
Nitrite: NEGATIVE
Protein, ur: NEGATIVE mg/dL
Specific Gravity, Urine: 1.005 (ref 1.005–1.030)
Urobilinogen, UA: 0.2 mg/dL (ref 0.0–1.0)
pH: 6.5 (ref 5.0–8.0)

## 2012-07-13 LAB — WET PREP, GENITAL
Trich, Wet Prep: NONE SEEN
Yeast Wet Prep HPF POC: NONE SEEN

## 2012-07-13 LAB — PREGNANCY, URINE: Preg Test, Ur: NEGATIVE

## 2012-07-13 MED ORDER — CEFTRIAXONE SODIUM 250 MG IJ SOLR
250.0000 mg | Freq: Once | INTRAMUSCULAR | Status: AC
Start: 2012-07-13 — End: 2012-07-13
  Administered 2012-07-13: 250 mg via INTRAMUSCULAR
  Filled 2012-07-13: qty 250

## 2012-07-13 MED ORDER — AZITHROMYCIN 1 G PO PACK
1.0000 g | PACK | Freq: Once | ORAL | Status: AC
  Administered 2012-07-13: 1 g via ORAL
  Filled 2012-07-13: qty 1

## 2012-07-13 MED ORDER — LIDOCAINE HCL 2 % IJ SOLN
INTRAMUSCULAR | Status: AC
  Filled 2012-07-13: qty 1

## 2012-07-13 NOTE — ED Provider Notes (Signed)
History     CSN: [id number]  Arrival date & time 07/13/12  0248   First MD Initiated Contact with Patient 07/13/12 0257      Chief Complaint  Patient presents with  .  Pelvic pain     (Consider location/radiation/quality/duration/timing/severity/associated sxs/prior treatment) HPI Is a 21 year old white female who has been sexually active with her boyfriend for about the past 6 months. She found out yesterday that he had been unfaithful to her and was having sexual relations with a woman who tested positive for Chlamydia. The patient herself has had some suprapubic discomfort and pain with urination for about a week. This worsened over the past 2 days. She's also had some general malaise. She's had some slight vaginal discharge. She has had no vaginal bleeding but did have her menses last week. Her pain is moderate in severity, worse with palpation or movement. She is not sure if her boyfriend has been treated but states that she has terminated their relationship.  Past Medical History  Diagnosis Date  . Bilateral posterior subcapsular polar cataract   . Anxiety   . IBS (irritable bowel syndrome)     2008    Past Surgical History  Procedure Date  . Foot fracture surgery     History reviewed. No pertinent family history.  History  Substance Use Topics  . Smoking status: Current Everyday Smoker -- 0.5 packs/day for 3 years    Types: Cigarettes  . Smokeless tobacco: Not on file  . Alcohol Use: No    OB History    Grav Para Term Preterm Abortions TAB SAB Ect Mult Living                  Review of Systems  All other systems reviewed and are negative.    Allergies  Morphine and related; Wellbutrin; and Celexa  Home Medications   Current Outpatient Rx  Name Route Sig Dispense Refill  . CITALOPRAM HYDROBROMIDE 20 MG PO TABS Oral Take 1 tablet (20 mg total) by mouth daily with breakfast. For Depression. 30 tablet 0  . CLONAZEPAM 0.5 MG PO TABS Oral Take 1 tablet  (0.5 mg total) by mouth daily with breakfast. For anxiety and panic symptoms. 30 tablet 0  . LEVONORGESTREL-ETHINYL ESTRAD 0.1-20 MG-MCG PO TABS Oral Take 1 tablet by mouth daily. For contraception. 1 Package 0  . PREDNISONE 20 MG PO TABS Oral Take 3 tablets (60 mg total) by mouth daily. 45 tablet 0  . QUETIAPINE FUMARATE 25 MG PO TABS Oral Take 1 tablet (25 mg total) by mouth at bedtime. For mood stabilization. 30 tablet 0    Pulse 92  Temp 98.2 F (36.8 C) (Oral)  Resp 16  SpO2 100%  LMP 07/06/2012  Physical Exam General: Well-developed, well-nourished female in no acute distress; appearance consistent with age of record HENT: normocephalic, atraumatic Eyes: pupils equal round and reactive to light; extraocular muscles intact Neck: supple Heart: regular rate and rhythm Lungs: clear to auscultation bilaterally Abdomen: soft; nondistended; suprapubic tenderness; no masses or hepatosplenomegaly; bowel sounds present GU: Normal external genitalia; vaginal bleeding; physiologic appearing vaginal discharge; normal-appearing cervix; cervical os closed; no cervical motion tenderness; bilateral adnexal tenderness Extremities: No deformity; full range of motion; no Neurologic: Awake, alert and oriented; motor function intact in all extremities and symmetric; no facial droop Skin: Warm and dry Psychiatric: Normal mood and affect    ED Course  Procedures (including critical care time)     MDM   Nursing notes  and vitals signs, including pulse oximetry, reviewed.  Summary of this visit's results, reviewed by myself:  Labs:  Results for orders placed during the hospital encounter of 07/13/12  WET PREP, GENITAL      Component Value Range   Yeast Wet Prep HPF POC NONE SEEN  NONE SEEN   Trich, Wet Prep NONE SEEN  NONE SEEN   Clue Cells Wet Prep HPF POC NONE SEEN  NONE SEEN   WBC, Wet Prep HPF POC MODERATE (*) NONE SEEN  URINALYSIS, ROUTINE W REFLEX MICROSCOPIC      Component Value  Range   Color, Urine YELLOW  YELLOW   APPearance CLEAR  CLEAR   Specific Gravity, Urine 1.005  1.005 - 1.030   pH 6.5  5.0 - 8.0   Glucose, UA NEGATIVE  NEGATIVE mg/dL   Hgb urine dipstick NEGATIVE  NEGATIVE   Bilirubin Urine NEGATIVE  NEGATIVE   Ketones, ur NEGATIVE  NEGATIVE mg/dL   Protein, ur NEGATIVE  NEGATIVE mg/dL   Urobilinogen, UA 0.2  0.0 - 1.0 mg/dL   Nitrite NEGATIVE  NEGATIVE   Leukocytes, UA NEGATIVE  NEGATIVE  PREGNANCY, URINE      Component Value Range   Preg Test, Ur NEGATIVE  NEGATIVE           Hanley Seamen, MD 07/13/12 [id number]

## 2012-07-13 NOTE — ED Notes (Signed)
Pt sexually active with current boyfriend, boyfriend + for std

## 2012-09-18 ENCOUNTER — Encounter (HOSPITAL_BASED_OUTPATIENT_CLINIC_OR_DEPARTMENT_OTHER): Payer: Self-pay

## 2012-09-18 ENCOUNTER — Emergency Department (HOSPITAL_BASED_OUTPATIENT_CLINIC_OR_DEPARTMENT_OTHER)
Admission: EM | Admit: 2012-09-18 | Discharge: 2012-09-18 | Disposition: A | Discharge status: Home / Self Care | Payer: 59 | Attending: Emergency Medicine | Admitting: Emergency Medicine

## 2012-09-18 DIAGNOSIS — F3289 Other specified depressive episodes: Secondary | ICD-10-CM | POA: Insufficient documentation

## 2012-09-18 DIAGNOSIS — F329 Major depressive disorder, single episode, unspecified: Secondary | ICD-10-CM | POA: Insufficient documentation

## 2012-09-18 DIAGNOSIS — Z8719 Personal history of other diseases of the digestive system: Secondary | ICD-10-CM | POA: Insufficient documentation

## 2012-09-18 DIAGNOSIS — K0889 Other specified disorders of teeth and supporting structures: Secondary | ICD-10-CM

## 2012-09-18 DIAGNOSIS — Z9889 Other specified postprocedural states: Secondary | ICD-10-CM | POA: Insufficient documentation

## 2012-09-18 DIAGNOSIS — Z8659 Personal history of other mental and behavioral disorders: Secondary | ICD-10-CM | POA: Insufficient documentation

## 2012-09-18 DIAGNOSIS — Z87891 Personal history of nicotine dependence: Secondary | ICD-10-CM | POA: Insufficient documentation

## 2012-09-18 DIAGNOSIS — K089 Disorder of teeth and supporting structures, unspecified: Secondary | ICD-10-CM | POA: Insufficient documentation

## 2012-09-18 MED ORDER — CEFTRIAXONE SODIUM 1 G IJ SOLR
1.0000 g | Freq: Once | INTRAMUSCULAR | Status: AC
  Administered 2012-09-18: 1 g via INTRAMUSCULAR
  Filled 2012-09-18: qty 10

## 2012-09-18 NOTE — ED Notes (Signed)
Dental pain x 1 week.  S/P wisdom tooth extraction and on antibiotics.

## 2012-09-18 NOTE — ED Provider Notes (Signed)
History     CSN: [id number]  Arrival date & time 09/18/12  1618   First MD Initiated Contact with Patient 09/18/12 1626      Chief Complaint  Patient presents with  . Dental Pain    (Consider location/radiation/quality/duration/timing/severity/associated sxs/prior treatment) HPI Comments: Patient states she had a cavity involving a wisdom tooth that was removed approximately 7-8 days ago. The patient was placed on amoxicillin before the extraction because of infection, the problem continued in the antibiotic has been changed to Cleocin. The patient states that she continues to have puslike drainage from the extraction site. She has soreness and pain involving her jaw and going down underneath her ear. And she states that when she takes the antibiotic she sometimes feels like she has aching all over. She denies any bloody diarrhea, or diarrhea of any time.  Patient is a 21 y.o. female presenting with tooth pain. The history is provided by the patient.  Dental PainThe primary symptoms include mouth pain. Primary symptoms do not include shortness of breath or cough.  Additional symptoms do not include: trouble swallowing and nosebleeds. Associated symptoms comments: Sore, weak, decrease appetite. Pt reports a lot of pain.. Medical issues include: smoking.    Past Medical History  Diagnosis Date  . Bilateral posterior subcapsular polar cataract   . Anxiety   . IBS (irritable bowel syndrome)     2008    Past Surgical History  Procedure Date  . Foot fracture surgery     No family history on file.  History  Substance Use Topics  . Smoking status: Former Smoker -- 0.5 packs/day for 3 years  . Smokeless tobacco: Not on file  . Alcohol Use: No    OB History    Grav Para Term Preterm Abortions TAB SAB Ect Mult Living                  Review of Systems  Constitutional: Negative for activity change.       All ROS Neg except as noted in HPI  HENT: Positive for dental problem.  Negative for nosebleeds, trouble swallowing and neck pain.   Eyes: Negative for photophobia and discharge.  Respiratory: Negative for cough, shortness of breath and wheezing.   Cardiovascular: Negative for chest pain and palpitations.  Gastrointestinal: Negative for abdominal pain and blood in stool.  Genitourinary: Negative for dysuria, frequency and hematuria.  Musculoskeletal: Negative for back pain and arthralgias.  Skin: Negative.   Neurological: Negative for dizziness, seizures and speech difficulty.  Psychiatric/Behavioral: Negative for hallucinations and confusion.    Allergies  Morphine and related; Wellbutrin; and Celexa  Home Medications   Current Outpatient Rx  Name Route Sig Dispense Refill  . CLINDAMYCIN HCL 150 MG PO CAPS Oral Take 150 mg by mouth 3 (three) times daily.    [name] CITALOPRAM HYDROBROMIDE 20 MG PO TABS Oral Take 1 tablet (20 mg total) by mouth daily with breakfast. For Depression. 30 tablet 0  . CLONAZEPAM 0.5 MG PO TABS Oral Take 1 tablet (0.5 mg total) by mouth daily with breakfast. For anxiety and panic symptoms. 30 tablet 0  . LEVONORGESTREL-ETHINYL ESTRAD 0.1-20 MG-MCG PO TABS Oral Take 1 tablet by mouth daily. For contraception. 1 Package 0  . PREDNISONE 20 MG PO TABS Oral Take 3 tablets (60 mg total) by mouth daily. 45 tablet 0  . QUETIAPINE FUMARATE 25 MG PO TABS Oral Take 1 tablet (25 mg total) by mouth at bedtime. For mood stabilization. 30 tablet  0    BP 159/88  Pulse 120  Temp 99.5 F (37.5 C) (Oral)  Resp 18  Ht 5\' 5"  (1.651 m)  Wt 115 lb (52.164 kg)  BMI 19.14 kg/m2  SpO2 100%  LMP 08/20/2012  Physical Exam  Nursing note and vitals reviewed. Constitutional: She is oriented to person, place, and time. She appears well-developed and well-nourished.  Non-toxic appearance.  HENT:  Head: Normocephalic.  Right Ear: Tympanic membrane and external ear normal.  Left Ear: Tympanic membrane and external ear normal.       The post extraction  dental site on the left lower molar area has not yet closed. There is increased redness around the site. There is no visible abscess present. The airway is patent. There is no swelling under the tongue. The ears are clear. There is no pain or redness behind the left ear in particular.  Eyes: EOM and lids are normal. Pupils are equal, round, and reactive to light.  Neck: Normal range of motion. Neck supple. Carotid bruit is not present.  Cardiovascular: Normal rate, regular rhythm, normal heart sounds, intact distal pulses and normal pulses.   Pulmonary/Chest: Breath sounds normal. No respiratory distress.  Abdominal: Soft. Bowel sounds are normal. There is no tenderness. There is no guarding.  Musculoskeletal: Normal range of motion.  Lymphadenopathy:       Head (right side): No submandibular adenopathy present.       Head (left side): No submandibular adenopathy present.    She has no cervical adenopathy.  Neurological: She is alert and oriented to person, place, and time. She has normal strength. No cranial nerve deficit or sensory deficit.  Skin: Skin is warm and dry.  Psychiatric: She has a normal mood and affect. Her speech is normal.    ED Course  Procedures (including critical care time)  Labs Reviewed - No data to display No results found.   No diagnosis found.    MDM  I have reviewed nursing notes, vital signs, and all appropriate lab and imaging results for this patient. The patient is treated in the emergency department with Rocephin intramuscularly and she feels that the infection is slow to pass. The patient is advised to finish your clindamycin. She is follow with her primary physician if the body aches continue. Patient invited to see a primary care physician or return to the emergency department if any changes, problems, or concerns.       Kathie Dike, [place] 09/18/12 1650

## 2012-09-18 NOTE — ED Provider Notes (Signed)
Medical screening examination/treatment/procedure(s) were performed by non-physician practitioner and as supervising physician I was immediately available for consultation/collaboration.   Celene Kras, MD 09/18/12 [phone]

## 2012-11-30 ENCOUNTER — Encounter (HOSPITAL_COMMUNITY): Payer: Self-pay | Admitting: Psychology

## 2012-11-30 DIAGNOSIS — F1220 Cannabis dependence, uncomplicated: Secondary | ICD-10-CM

## 2012-11-30 DIAGNOSIS — F39 Unspecified mood [affective] disorder: Secondary | ICD-10-CM

## 2012-11-30 NOTE — Progress Notes (Signed)
Patient ID: Gina Dorsey, female   DOB: 1991-05-23, 23 y.o.   MRN: [id number] Outpatient Therapist Discharge Summary  [name]    May 24, 1991   Admission Date: 05/24/12   Discharge Date:  07/02/12 Reason for Discharge:  Referred to outside provider for dual dx care Diagnosis:   1. Cannabis dependence   2. Unspecified episodic mood disorder      Comments:  Pt didn't return for appointment made till transition to new provider occurred and no show for psychiatric eval w/ Jorje Guild on 06/21/12.  Forde Radon

## 2013-01-10 ENCOUNTER — Encounter (HOSPITAL_BASED_OUTPATIENT_CLINIC_OR_DEPARTMENT_OTHER): Payer: Self-pay | Admitting: Emergency Medicine

## 2013-01-10 ENCOUNTER — Emergency Department (HOSPITAL_BASED_OUTPATIENT_CLINIC_OR_DEPARTMENT_OTHER)
Admission: EM | Admit: 2013-01-10 | Discharge: 2013-01-10 | Disposition: A | Discharge status: Home / Self Care | Payer: 59 | Attending: Emergency Medicine | Admitting: Emergency Medicine

## 2013-01-10 DIAGNOSIS — Z79899 Other long term (current) drug therapy: Secondary | ICD-10-CM | POA: Insufficient documentation

## 2013-01-10 DIAGNOSIS — R509 Fever, unspecified: Secondary | ICD-10-CM | POA: Insufficient documentation

## 2013-01-10 DIAGNOSIS — Z87891 Personal history of nicotine dependence: Secondary | ICD-10-CM | POA: Insufficient documentation

## 2013-01-10 DIAGNOSIS — J029 Acute pharyngitis, unspecified: Secondary | ICD-10-CM

## 2013-01-10 DIAGNOSIS — F411 Generalized anxiety disorder: Secondary | ICD-10-CM | POA: Insufficient documentation

## 2013-01-10 DIAGNOSIS — Z8669 Personal history of other diseases of the nervous system and sense organs: Secondary | ICD-10-CM | POA: Insufficient documentation

## 2013-01-10 MED ORDER — DEXAMETHASONE SODIUM PHOSPHATE 10 MG/ML IJ SOLN
10.0000 mg | Freq: Once | INTRAMUSCULAR | Status: AC
Start: 2013-01-10 — End: 2013-01-10
  Administered 2013-01-10: 10 mg via INTRAMUSCULAR
  Filled 2013-01-10: qty 1

## 2013-01-10 MED ORDER — CEPHALEXIN 500 MG PO CAPS: 500.0000 mg | ORAL_CAPSULE | Freq: Four times a day (QID) | ORAL | Status: DC

## 2013-01-10 NOTE — ED Notes (Addendum)
Pt reports recurrent tonsillitis and sore throat for 5 days. PCP gave Z-pack for cold symptoms 2 months ago per pt. Left tonsil is enlarged and inflamed. Pt reports night sweats this week. Gargling salt water and taking tylenol and motrin for pain.

## 2013-01-10 NOTE — ED Provider Notes (Signed)
History/physical exam/procedure(s) were performed by non-physician practitioner and as supervising physician I was immediately available for consultation/collaboration. I have reviewed all notes and am in agreement with care and plan.   Parker Wherley S Leondro Coryell, MD 01/10/13 1448 

## 2013-01-10 NOTE — ED Provider Notes (Signed)
History     CSN: [id number]  Arrival date & time 01/10/13  1151   First MD Initiated Contact with Patient 01/10/13 1210      Chief Complaint  Patient presents with  . Sore Throat    (Consider location/radiation/quality/duration/timing/severity/associated sxs/prior treatment) Patient is a 22 y.o. female presenting with pharyngitis. The history is provided by the patient. No language interpreter was used.  Sore Throat This is a new problem. The current episode started in the past 7 days. The problem occurs constantly. The problem has been unchanged. Associated symptoms include a fever, a sore throat and swollen glands. Pertinent negatives include no rash. The symptoms are aggravated by swallowing. She has tried acetaminophen and NSAIDs for the symptoms. The treatment provided mild relief.    Past Medical History  Diagnosis Date  . Bilateral posterior subcapsular polar cataract   . Anxiety   . IBS (irritable bowel syndrome)     2008    Past Surgical History  Procedure Laterality Date  . Foot fracture surgery      No family history on file.  History  Substance Use Topics  . Smoking status: Former Smoker -- 0.50 packs/day for 3 years    Types: Cigarettes  . Smokeless tobacco: Not on file  . Alcohol Use: No    OB History   Grav Para Term Preterm Abortions TAB SAB Ect Mult Living                  Review of Systems  Constitutional: Positive for fever.  HENT: Positive for sore throat.   Respiratory: Negative.   Cardiovascular: Negative.   Skin: Negative for rash.    Allergies  Morphine and related; Wellbutrin; and Celexa  Home Medications   Current Outpatient Rx  Name  Route  Sig  Dispense  Refill  . ARIPiprazole (ABILIFY) 5 MG tablet   Oral   Take 2.5 mg by mouth daily.         [name] PARoxetine (PAXIL) 40 MG tablet   Oral   Take 40 mg by mouth every morning.         . cephALEXin (KEFLEX) 500 MG capsule   Oral   Take 1 capsule (500 mg total) by mouth 4  (four) times daily.   40 capsule   0   . citalopram (CELEXA) 20 MG tablet   Oral   Take 1 tablet (20 mg total) by mouth daily with breakfast. For Depression.   30 tablet   0   . clindamycin (CLEOCIN) 150 MG capsule   Oral   Take 150 mg by mouth 3 (three) times daily.         [name] EXPIRED: clonazePAM (KLONOPIN) 0.5 MG tablet   Oral   Take 1 tablet (0.5 mg total) by mouth daily with breakfast. For anxiety and panic symptoms.   30 tablet   0   . levonorgestrel-ethinyl estradiol (LUTERA) 0.1-20 MG-MCG tablet   Oral   Take 1 tablet by mouth daily. For contraception.   1 Package   0   . predniSONE (DELTASONE) 20 MG tablet   Oral   Take 3 tablets (60 mg total) by mouth daily.   45 tablet   0   . EXPIRED: QUEtiapine (SEROQUEL) 25 MG tablet   Oral   Take 1 tablet (25 mg total) by mouth at bedtime. For mood stabilization.   30 tablet   0     BP 134/89  Pulse 94  Temp(Src) 98.9 F (37.2  C) (Oral)  Resp 18  Ht 5\' 5"  (1.651 m)  Wt 130 lb (58.968 kg)  BMI 21.63 kg/m2  SpO2 100%  Physical Exam  Nursing note and vitals reviewed. Constitutional: She is oriented to person, place, and time. She appears well-developed and well-nourished.  HENT:  Head: Normocephalic and atraumatic.  Right Ear: External ear normal.  Left Ear: External ear normal.  Mouth/Throat: Oropharyngeal exudate, posterior oropharyngeal edema and posterior oropharyngeal erythema present. No tonsillar abscesses.  Eyes: Conjunctivae and EOM are normal. Pupils are equal, round, and reactive to light.  Neck: Normal range of motion. Neck supple.  Cardiovascular: Normal rate and regular rhythm.   Pulmonary/Chest: Effort normal and breath sounds normal.  Musculoskeletal: Normal range of motion.  Neurological: She is alert and oriented to person, place, and time.    ED Course  Procedures (including critical care time)  Labs Reviewed - No data to display No results found.   1. Pharyngitis       MDM   Will treat with steroids and antibiotics:no sign of peritonsillar abscess at this time        Teressa Lower, NP 01/10/13 1234

## 2013-08-10 ENCOUNTER — Encounter (HOSPITAL_BASED_OUTPATIENT_CLINIC_OR_DEPARTMENT_OTHER): Payer: Self-pay

## 2013-08-10 ENCOUNTER — Emergency Department (HOSPITAL_BASED_OUTPATIENT_CLINIC_OR_DEPARTMENT_OTHER)
Admission: EM | Admit: 2013-08-10 | Discharge: 2013-08-10 | Discharge status: Against Medical Advice | Payer: 59 | Attending: Emergency Medicine | Admitting: Emergency Medicine

## 2013-08-10 DIAGNOSIS — Y939 Activity, unspecified: Secondary | ICD-10-CM | POA: Insufficient documentation

## 2013-08-10 DIAGNOSIS — Y92009 Unspecified place in unspecified non-institutional (private) residence as the place of occurrence of the external cause: Secondary | ICD-10-CM | POA: Insufficient documentation

## 2013-08-10 DIAGNOSIS — S8990XA Unspecified injury of unspecified lower leg, initial encounter: Secondary | ICD-10-CM | POA: Insufficient documentation

## 2013-08-10 DIAGNOSIS — W010XXA Fall on same level from slipping, tripping and stumbling without subsequent striking against object, initial encounter: Secondary | ICD-10-CM | POA: Insufficient documentation

## 2013-08-10 NOTE — ED Notes (Signed)
Patient here with left ankle pain after tripping going down steps last night inside home. Arrived with ASO on same that was placed earlier at an urgent care. Here for further evaluation and pain control

## 2013-08-10 NOTE — ED Notes (Signed)
Provider went to patient's room but patient is not there

## 2013-08-10 NOTE — ED Notes (Signed)
Patient still not in the room.

## 2013-09-27 ENCOUNTER — Encounter (HOSPITAL_COMMUNITY): Payer: Self-pay | Admitting: Emergency Medicine

## 2013-09-27 ENCOUNTER — Emergency Department (HOSPITAL_COMMUNITY)
Admission: EM | Admit: 2013-09-27 | Discharge: 2013-09-28 | Disposition: A | Discharge status: Other Acute Inpatient Hospital | Payer: 59 | Attending: Emergency Medicine | Admitting: Emergency Medicine

## 2013-09-27 DIAGNOSIS — F1210 Cannabis abuse, uncomplicated: Secondary | ICD-10-CM | POA: Insufficient documentation

## 2013-09-27 DIAGNOSIS — F429 Obsessive-compulsive disorder, unspecified: Secondary | ICD-10-CM | POA: Insufficient documentation

## 2013-09-27 DIAGNOSIS — Z91040 Latex allergy status: Secondary | ICD-10-CM | POA: Insufficient documentation

## 2013-09-27 DIAGNOSIS — Z043 Encounter for examination and observation following other accident: Secondary | ICD-10-CM | POA: Insufficient documentation

## 2013-09-27 DIAGNOSIS — F17200 Nicotine dependence, unspecified, uncomplicated: Secondary | ICD-10-CM | POA: Insufficient documentation

## 2013-09-27 DIAGNOSIS — Y9389 Activity, other specified: Secondary | ICD-10-CM | POA: Insufficient documentation

## 2013-09-27 DIAGNOSIS — F4310 Post-traumatic stress disorder, unspecified: Secondary | ICD-10-CM | POA: Insufficient documentation

## 2013-09-27 DIAGNOSIS — F3289 Other specified depressive episodes: Secondary | ICD-10-CM | POA: Insufficient documentation

## 2013-09-27 DIAGNOSIS — Z79899 Other long term (current) drug therapy: Secondary | ICD-10-CM | POA: Insufficient documentation

## 2013-09-27 DIAGNOSIS — Z3202 Encounter for pregnancy test, result negative: Secondary | ICD-10-CM | POA: Insufficient documentation

## 2013-09-27 DIAGNOSIS — Y92410 Unspecified street and highway as the place of occurrence of the external cause: Secondary | ICD-10-CM | POA: Insufficient documentation

## 2013-09-27 DIAGNOSIS — F411 Generalized anxiety disorder: Secondary | ICD-10-CM | POA: Insufficient documentation

## 2013-09-27 DIAGNOSIS — R45851 Suicidal ideations: Secondary | ICD-10-CM | POA: Insufficient documentation

## 2013-09-27 DIAGNOSIS — F603 Borderline personality disorder: Secondary | ICD-10-CM | POA: Insufficient documentation

## 2013-09-27 DIAGNOSIS — Z8719 Personal history of other diseases of the digestive system: Secondary | ICD-10-CM | POA: Insufficient documentation

## 2013-09-27 DIAGNOSIS — F419 Anxiety disorder, unspecified: Secondary | ICD-10-CM

## 2013-09-27 DIAGNOSIS — F329 Major depressive disorder, single episode, unspecified: Secondary | ICD-10-CM | POA: Insufficient documentation

## 2013-09-27 HISTORY — DX: Borderline personality disorder: F60.3

## 2013-09-27 HISTORY — DX: Post-traumatic stress disorder, unspecified: F43.10

## 2013-09-27 HISTORY — DX: Obsessive-compulsive disorder, unspecified: F42.9

## 2013-09-27 LAB — CBC WITH DIFFERENTIAL/PLATELET
Basophils Absolute: 0 10*3/uL (ref 0.0–0.1)
Eosinophils Absolute: 0.2 10*3/uL (ref 0.0–0.7)
Eosinophils Relative: 2 % (ref 0–5)
MCH: 30.6 pg (ref 26.0–34.0)
MCV: 88.7 fL (ref 78.0–100.0)
Platelets: 267 10*3/uL (ref 150–400)
RDW: 13.7 % (ref 11.5–15.5)
WBC: 11.4 10*3/uL — ABNORMAL HIGH (ref 4.0–10.5)

## 2013-09-27 LAB — COMPREHENSIVE METABOLIC PANEL
ALT: 18 U/L (ref 0–35)
AST: 22 U/L (ref 0–37)
Albumin: 4 g/dL (ref 3.5–5.2)
Calcium: 9.7 mg/dL (ref 8.4–10.5)
Sodium: 136 mEq/L (ref 135–145)
Total Protein: 7.6 g/dL (ref 6.0–8.3)

## 2013-09-27 LAB — POCT PREGNANCY, URINE: Preg Test, Ur: NEGATIVE

## 2013-09-27 LAB — RAPID URINE DRUG SCREEN, HOSP PERFORMED
Amphetamines: NOT DETECTED
Benzodiazepines: NOT DETECTED
Tetrahydrocannabinol: POSITIVE — AB

## 2013-09-27 LAB — SALICYLATE LEVEL: Salicylate Lvl: <2 mg/dL — ABNORMAL LOW (ref 2.8–20.0)

## 2013-09-27 MED ORDER — PAROXETINE HCL 20 MG PO TABS
40.0000 mg | ORAL_TABLET | Freq: Every day | ORAL | Status: DC
  Administered 2013-09-27: 40 mg via ORAL
  Filled 2013-09-27 (×2): qty 2

## 2013-09-27 MED ORDER — PAROXETINE HCL 20 MG PO TABS
40.0000 mg | ORAL_TABLET | Freq: Every day | ORAL | Status: DC
  Filled 2013-09-27 (×2): qty 2

## 2013-09-27 MED ORDER — LEVONORGESTREL-ETHINYL ESTRAD 0.1-20 MG-MCG PO TABS: 1.0000 | ORAL_TABLET | Freq: Every day | ORAL | Status: DC

## 2013-09-27 MED ORDER — GABAPENTIN 300 MG PO CAPS
900.0000 mg | ORAL_CAPSULE | Freq: Every day | ORAL | Status: DC
  Administered 2013-09-27: 900 mg via ORAL
  Filled 2013-09-27 (×3): qty 3

## 2013-09-27 MED ORDER — NICOTINE 21 MG/24HR TD PT24: 21.0000 mg | MEDICATED_PATCH | Freq: Every day | TRANSDERMAL | Status: DC

## 2013-09-27 MED ORDER — ONDANSETRON HCL 4 MG PO TABS: 4.0000 mg | ORAL_TABLET | Freq: Three times a day (TID) | ORAL | Status: DC | PRN

## 2013-09-27 MED ORDER — CLONAZEPAM 0.5 MG PO TABS: 0.5000 mg | ORAL_TABLET | Freq: Every day | ORAL | Status: DC
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
## 2013-09-27 NOTE — ED Provider Notes (Signed)
Medical screening examination/treatment/procedure(s) were performed by non-physician practitioner and as supervising physician I was immediately available for consultation/collaboration.  EKG Interpretation   None         Layla Maw Dragan Tamburrino, DO 09/27/13 2257
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
## 2013-09-28 ENCOUNTER — Encounter (HOSPITAL_COMMUNITY): Payer: Self-pay | Admitting: Psychiatry

## 2013-09-28 ENCOUNTER — Inpatient Hospital Stay (HOSPITAL_COMMUNITY)
Admission: AD | Admit: 2013-09-28 | Discharge: 2013-10-03 | DRG: 885 | Disposition: A | Discharge status: Home / Self Care | Payer: 59 | Source: Intra-hospital | Attending: Psychiatry | Admitting: Psychiatry

## 2013-09-28 DIAGNOSIS — F1210 Cannabis abuse, uncomplicated: Secondary | ICD-10-CM | POA: Diagnosis present

## 2013-09-28 DIAGNOSIS — F329 Major depressive disorder, single episode, unspecified: Secondary | ICD-10-CM

## 2013-09-28 DIAGNOSIS — F1220 Cannabis dependence, uncomplicated: Secondary | ICD-10-CM

## 2013-09-28 DIAGNOSIS — F3130 Bipolar disorder, current episode depressed, mild or moderate severity, unspecified: Secondary | ICD-10-CM

## 2013-09-28 DIAGNOSIS — F411 Generalized anxiety disorder: Secondary | ICD-10-CM

## 2013-09-28 DIAGNOSIS — F603 Borderline personality disorder: Secondary | ICD-10-CM | POA: Diagnosis present

## 2013-09-28 DIAGNOSIS — F314 Bipolar disorder, current episode depressed, severe, without psychotic features: Principal | ICD-10-CM | POA: Diagnosis present

## 2013-09-28 DIAGNOSIS — F429 Obsessive-compulsive disorder, unspecified: Secondary | ICD-10-CM | POA: Diagnosis present

## 2013-09-28 DIAGNOSIS — F3160 Bipolar disorder, current episode mixed, unspecified: Secondary | ICD-10-CM

## 2013-09-28 DIAGNOSIS — R45851 Suicidal ideations: Secondary | ICD-10-CM

## 2013-09-28 DIAGNOSIS — Z79899 Other long term (current) drug therapy: Secondary | ICD-10-CM

## 2013-09-28 DIAGNOSIS — F4310 Post-traumatic stress disorder, unspecified: Secondary | ICD-10-CM

## 2013-09-28 MED ORDER — CLONAZEPAM 0.5 MG PO TABS
ORAL_TABLET | ORAL | Status: AC
  Administered 2013-09-28: 0.5 mg via ORAL
  Filled 2013-09-28: qty 1

## 2013-09-28 MED ORDER — GABAPENTIN 300 MG PO CAPS
300.0000 mg | ORAL_CAPSULE | Freq: Three times a day (TID) | ORAL | Status: DC
  Administered 2013-09-28 – 2013-09-29 (×3): 300 mg via ORAL
  Filled 2013-09-28 (×7): qty 1

## 2013-09-28 MED ORDER — DIPHENHYDRAMINE HCL 50 MG/ML IJ SOLN
50.0000 mg | Freq: Once | INTRAMUSCULAR | Status: AC
  Administered 2013-09-28: 50 mg via INTRAMUSCULAR
  Filled 2013-09-28 (×2): qty 1

## 2013-09-28 MED ORDER — ACETAMINOPHEN 325 MG PO TABS
650.0000 mg | ORAL_TABLET | Freq: Four times a day (QID) | ORAL | Status: DC | PRN
  Administered 2013-09-30 – 2013-10-01 (×2): 650 mg via ORAL
  Filled 2013-09-28 (×2): qty 2

## 2013-09-28 MED ORDER — MAGNESIUM HYDROXIDE 400 MG/5ML PO SUSP: 30.0000 mL | Freq: Every day | ORAL | Status: DC | PRN

## 2013-09-28 MED ORDER — LORAZEPAM 2 MG/ML IJ SOLN
2.0000 mg | Freq: Once | INTRAMUSCULAR | Status: AC
  Administered 2013-09-28: 2 mg via INTRAMUSCULAR
  Filled 2013-09-28: qty 1

## 2013-09-28 MED ORDER — ALUM & MAG HYDROXIDE-SIMETH 200-200-20 MG/5ML PO SUSP: 30.0000 mL | ORAL | Status: DC | PRN

## 2013-09-28 MED ORDER — PAROXETINE HCL 20 MG PO TABS
40.0000 mg | ORAL_TABLET | Freq: Every day | ORAL | Status: DC
  Administered 2013-09-28 – 2013-10-02 (×5): 40 mg via ORAL
  Filled 2013-09-28 (×6): qty 2
  Filled 2013-09-28: qty 6

## 2013-09-28 MED ORDER — CLONAZEPAM 0.5 MG PO TABS
0.5000 mg | ORAL_TABLET | ORAL | Status: AC
  Administered 2013-09-28: 0.5 mg via ORAL

## 2013-09-28 MED ORDER — ZIPRASIDONE MESYLATE 20 MG IM SOLR
10.0000 mg | Freq: Once | INTRAMUSCULAR | Status: AC
  Administered 2013-09-28: 10 mg via INTRAMUSCULAR
  Filled 2013-09-28 (×2): qty 20

## 2013-09-28 MED ORDER — DIPHENHYDRAMINE HCL 50 MG/ML IJ SOLN: 50.0000 mg | Freq: Once | INTRAMUSCULAR | Status: DC

## 2013-09-28 MED ORDER — PAROXETINE HCL 20 MG PO TABS
40.0000 mg | ORAL_TABLET | Freq: Every day | ORAL | Status: DC
  Filled 2013-09-28: qty 2
  Filled 2013-09-28: qty 4
  Filled 2013-09-28: qty 2

## 2013-09-28 MED ORDER — NABUMETONE 500 MG PO TABS
500.0000 mg | ORAL_TABLET | Freq: Two times a day (BID) | ORAL | Status: DC
  Administered 2013-09-28 – 2013-10-03 (×10): 500 mg via ORAL
  Filled 2013-09-28 (×14): qty 1

## 2013-09-28 MED ORDER — HYDROXYZINE HCL 25 MG PO TABS
25.0000 mg | ORAL_TABLET | Freq: Four times a day (QID) | ORAL | Status: DC | PRN
  Administered 2013-09-28 – 2013-09-30 (×3): 25 mg via ORAL
  Filled 2013-09-28 (×3): qty 1

## 2013-09-28 MED ORDER — CLONAZEPAM 0.5 MG PO TABS
0.5000 mg | ORAL_TABLET | Freq: Two times a day (BID) | ORAL | Status: DC
  Administered 2013-09-28 – 2013-10-03 (×11): 0.5 mg via ORAL
  Filled 2013-09-28 (×10): qty 1

## 2013-09-28 NOTE — ED Notes (Signed)
Pt not compliant with discharge.  gpd at bedside to transfer patient.  Pt verbally aggressive.  Pt belongings with [employer].
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
## 2013-09-28 NOTE — Progress Notes (Signed)
Patient ID: Gina Dorsey, female   DOB: [birth year], 22 y.o.   MRN: [id number]  Patient in quiet room sleeping. Respiration regular and unlabored. No sign of distress noted at this time. 15 mins checks for safety. Pt is safe.
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
## 2013-09-28 NOTE — Progress Notes (Signed)
Pt agitated and irate using profanity it the cafeteria and disrupting the milieu on [address].  Pt transferred to [address] and made a DNA per orders from Hennessey, NP.

## 2013-09-28 NOTE — Progress Notes (Signed)
[facility] Group Notes:  (Nursing/MHT/Case Management/Adjunct)  Date:  09/28/2013  Time:  2:38 PM  Type of Therapy:  Psychoeducational Skills  Participation Level:  Did Not Attend   Summary of Progress/Problems: pt was in bed asleep  Gina Dorsey 09/28/2013, 2:38 PM

## 2013-09-28 NOTE — [facility] Suicide Risk Assessment (Signed)
Suicide Risk Assessment  Admission Assessment     Nursing information obtained from:    Demographic factors:    Current Mental Status:    Loss Factors:    Historical Factors:    Risk Reduction Factors:     CLINICAL FACTORS:   Bipolar Disorder:   Mixed State Alcohol/Substance Abuse/Dependencies More than one psychiatric diagnosis Unstable or Poor Therapeutic Relationship Previous Psychiatric Diagnoses and Treatments  COGNITIVE FEATURES THAT CONTRIBUTE TO RISK:  Closed-mindedness Loss of executive function Polarized thinking Thought constriction (tunnel vision)    SUICIDE RISK:   Moderate:  Frequent suicidal ideation with limited intensity, and duration, some specificity in terms of plans, no associated intent, good self-control, limited dysphoria/symptomatology, some risk factors present, and identifiable protective factors, including available and accessible social support.  PLAN OF CARE:  I certify that inpatient services furnished can reasonably be expected to improve the patient's condition.  ARFEEN,SYED T. 09/28/2013, 12:25 PM

## 2013-09-28 NOTE — Progress Notes (Signed)
The focus of this group is to help patients review their daily goal of treatment and discuss progress on daily workbooks. Pt attended the evening group session and responded to discussion prompts from the Writer. Pt reported having had a terrible day on the unit with nothing good in it at all. Pt also said she planned to take better care of herself upon discharge by not telling people that she wanted to kill herself. "I've had those thoughts since I was younger and they never go away. They freak people out when I tell them I have them and they send me here." Pt required redirection several times during group for either interrupting or talking while her peers spoke. Pt had a bad attitude during group and was quick to talk over her peers. "This hospital didn't fix me and I've been here six times. It never helps anyone."

## 2013-09-28 NOTE — Progress Notes (Signed)
Psychoeducational Group Note  Date: 09/28/2013 Time:  1015  Group Topic/Focus:  Identifying Needs:   The focus of this group is to help patients identify their personal needs that have been historically problematic and identify healthy behaviors to address their needs.  Participation Level:  Active  Participation Quality:  Attentive  Affect:  Appropriate  Cognitive:  Oriented  Insight:  Improving  Engagement in Group:  Engaged  Additional Comments:    Cornellius Kropp A 

## 2013-09-28 NOTE — Progress Notes (Addendum)
Patient ID: Gina Dorsey, female   DOB: [birth year], 22 y.o.   MRN: [id number]  Patient in quiet room sleeping. Respiration regular and unlabored. No sign of distress noted at this time. MHT at bedside. Pt is safe
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
## 2013-09-28 NOTE — Progress Notes (Signed)
Patient ID: Gina Dorsey, female   DOB: 07/07/1991, 22 y.o.   MRN: [id number] Call from floor nurse -pt agitated/refusing admission .she is under IVC-VO for geodon 10 mg/ativan 2 mg/benadryl 50 mg IM x 1

## 2013-09-28 NOTE — BHH Group Notes (Signed)
BHH Group Notes:  (Clinical Social Work)  09/28/2013   3:00-4:00PM  Summary of Progress/Problems:   The main focus of today's process group was for the patient to identify ways in which they have sabotaged their own mental health wellness/recovery.  Motivational interviewing was used to explore the reasons they engage in this behavior, and reasons they may have for wanting to change.  The Stages of Change were explained to the group using a handout, and patients identified where they are with regard to changing self-defeating behaviors.  The patient expressed anger at her parents, had been on the phone yelling at them just prior to group.  She stated this is her 6th admission here and "it's never helped."  She had little insight into her role in helping herself.  She said she becomes angry and punches walls, then gets depressed and sleeps all the time.  She stated she is supposed to patch a bunch of walls in her apartment but "I don't care, I'm going to jail anyway."  She said she is at [facility] because her parents swore out an affidavit, and displayed no insight into the IVC process requiring doctors to interview and agree she needed help.  Type of Therapy:  Process Group  Participation Level:  Active  Participation Quality:  Attentive and Resistant  Affect:  Angry and Flat  Cognitive:  Alert  Insight:  Limited  Engagement in Therapy:  Engaged  Modes of Intervention:  Education, Motivational Interviewing   Ambrose Mantle, LCSW 09/28/2013, 1:57 PM

## 2013-09-28 NOTE — Progress Notes (Signed)
.  Psychoeducational Group Note    Date: 09/28/2013 Time:  0930  Goal Setting Purpose of Group: To be able to set a goal that is measurable and that can be accomplished in one day  Participation Level:  Active  Participation Quality:  Appropriate  Affect:  Appropriate  Cognitive:  Oriented  Insight:  Improving  Engagement in Group:  Improving  Additional Comments:    Ildefonso Keaney A  

## 2013-09-29 DIAGNOSIS — F1910 Other psychoactive substance abuse, uncomplicated: Secondary | ICD-10-CM

## 2013-09-29 MED ORDER — LORAZEPAM 2 MG/ML IJ SOLN
2.0000 mg | INTRAMUSCULAR | Status: AC
  Administered 2013-09-29: 2 mg via INTRAMUSCULAR
  Filled 2013-09-29: qty 1

## 2013-09-29 MED ORDER — ZIPRASIDONE MESYLATE 20 MG IM SOLR
10.0000 mg | INTRAMUSCULAR | Status: AC
  Administered 2013-09-29: 10 mg via INTRAMUSCULAR
  Filled 2013-09-29: qty 20

## 2013-09-29 MED ORDER — ZIPRASIDONE MESYLATE 20 MG IM SOLR
INTRAMUSCULAR | Status: AC
  Filled 2013-09-29: qty 20

## 2013-09-29 MED ORDER — GABAPENTIN 300 MG PO CAPS
900.0000 mg | ORAL_CAPSULE | Freq: Every day | ORAL | Status: DC
  Administered 2013-09-29 – 2013-10-02 (×4): 900 mg via ORAL
  Filled 2013-09-29 (×5): qty 3

## 2013-09-29 MED ORDER — GABAPENTIN 600 MG PO TABS
900.0000 mg | ORAL_TABLET | Freq: Every day | ORAL | Status: DC
  Filled 2013-09-29 (×2): qty 1.5

## 2013-09-29 MED ORDER — DIPHENHYDRAMINE HCL 50 MG/ML IJ SOLN
25.0000 mg | INTRAMUSCULAR | Status: AC
  Administered 2013-09-29: 25 mg via INTRAMUSCULAR
  Filled 2013-09-29: qty 1
  Filled 2013-09-29: qty 0.5

## 2013-09-29 MED ORDER — LAMOTRIGINE 25 MG PO TABS
25.0000 mg | ORAL_TABLET | Freq: Two times a day (BID) | ORAL | Status: DC
  Administered 2013-09-29 – 2013-10-03 (×9): 25 mg via ORAL
  Filled 2013-09-29 (×12): qty 1
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
## 2013-09-29 NOTE — Progress Notes (Signed)
B[facility] Group Notes:  (Nursing/MHT/Case Management/Adjunct)  Date:  09/29/2013  Time:  8:00p.m.   Type of Therapy:  Psychoeducational Skills  Participation Level:  Active  Participation Quality:  Monopolizing  Affect:  Excited  Cognitive:  Disorganized  Insight:  Lacking  Engagement in Group:  Distracting  Modes of Intervention:  Education  Summary of Progress/Problems: The patient described her day as having been both "crappy and good". She verbalized that she had a good visit with her mother, but then expressed that she is taking new medication. She didn't mention that she was transferred to this hallway earlier in the daytime as well as the reason behind that decision. As a theme for the day, her support system consists of her parents and a friend.   Hazle Coca S 09/29/2013, 9:18 PM

## 2013-09-29 NOTE — BHH Group Notes (Signed)
[facility] Group Notes:  (Nursing/MHT/Case Management/Adjunct)  Date:  09/29/2013  Time:  10:57 AM  Type of Therapy:  Psychoeducational Skills  Participation Level:  Active  Participation Quality:  Appropriate  Affect:  Appropriate  Cognitive:  Alert  Insight:  Appropriate  Engagement in Group:  Monopolizing  Modes of Intervention:  Discussion  Summary of Progress/Problems:Pt participated in group and told all the pts to "cooperate" and they will get out of here  Nicole Cella 09/29/2013, 10:57 AM

## 2013-09-29 NOTE — Consult Note (Signed)
Agree with plan 

## 2013-09-29 NOTE — B[facility] Group Notes (Signed)
[facility] Group Notes:  (Clinical Social Work)  09/29/2013   11:15am-12:00pm  Summary of Progress/Problems:  The main focus of today's process group was to listen to a variety of genres of music and to identify that different types of music provoke different responses.  The patient then was able to identify personally what was soothing for them, as well as energizing.  Handouts were used to record feelings evoked, as well as how patient can personally use this knowledge in sleep habits, with depression, and with other symptoms.  The patient expressed understanding of concepts, as well as knowledge of how each type of music affected them and how this can be used when they are at home as a tool in their recovery.  Type of Therapy:  Music Therapy   Participation Level:  Active  Participation Quality:  Attentive and Sharing  Affect:  Excited   Cognitive:  Oriented  Insight:  Engaged  Engagement in Therapy:  Engaged  Modes of Intervention:   Activity, Exploration  Ambrose Mantle, LCSW 09/29/2013, 12:34 PM

## 2013-09-29 NOTE — Progress Notes (Signed)
Patient ID: Gina Dorsey, female   DOB: 04/04/1991, 22 y.o.   MRN: [id number]  ....Marland KitchenMarland KitchenPt laying in bed resting with eyes closed. Respirations even and unlabored. No distress noted.

## 2013-09-29 NOTE — Progress Notes (Signed)
[facility] MD Progress Note  09/29/2013 9:46 AM Gina Dorsey  MRN:  [id number] Subjective:  I need my medication.  Objective: Patient seen chart reviewed.  Patient was moved from 500 hall to 400 hall because patient was very agitated and having issues with other patient's .  She was given PRN medication for agitation.  Today she was noticed loud and agitated.  She was very upset on her father who committed her .  She was noticed loud on the phone when she was talking to the father.  She demanding to be on Klonopin which is prescribed by Dr. Antony Blackbird at [facility].  She was also requesting Neurontin prescribed for her neck pain.  RN spoke to the father who reported that patient has history of poor impulse control.  She is unable to keep her job and getting fired .  She has prescribed Klonopin however she sold her prescription .  She's not taking her Klonopin and has been noticed more agitated irritable angry and anxiety symptoms.  In the past she had a good response with Paxil.  She also has taken Abilify and Depakote in the past but did not like the side effects.  Patient appears very labile with pressured speech and irritability.  She denies any suicidal thoughts or homicidal thoughts but appears very emotional , tearful and angry.  When I ask about her suicidal comments on the facebook, patient become very upset and refuse to talk and walk out from room. She denies any hallucination or any paranoia.    Diagnosis:   DSM5: Schizophrenia Disorders: Substance/Addictive Disorders:  Cannabis Use Disorder - Moderate 9304.30)  Axis I: Bipolar, Depressed and Substance Abuse  ADL's:  Intact  Sleep: Fair  Appetite:  Fair  Suicidal Ideation:  Plan:  None Intent:  None Means:  None Homicidal Ideation:  Plan:  None Intent:  None Means:  None AEB (as evidenced by):  Psychiatric Specialty Exam: Review of Systems  Gastrointestinal: Positive for abdominal pain.  Musculoskeletal: Positive for neck pain.   Psychiatric/Behavioral: The patient has insomnia.        Agitated    Blood pressure 140/85, pulse 96, temperature 98.4 F (36.9 C), temperature source Oral, resp. rate 18, height 5\' 5"  (1.651 m), weight 70.308 kg (155 lb), last menstrual period 09/06/2013.Body mass index is 25.79 kg/(m^2).  General Appearance: Guarded  Eye Contact::  Minimal  Speech:  Pressured  Volume:  Increased  Mood:  Angry, Anxious and Irritable  Affect:  Labile  Thought Process:  Circumstantial, Irrelevant and Loose  Orientation:  Full (Time, Place, and Person)  Thought Content:  Rumination  Suicidal Thoughts:  Yes.  without intent/plan  Homicidal Thoughts:  No  Memory:  Negative  Judgement:  Impaired  Insight:  Lacking  Psychomotor Activity:  Increased  Concentration:  Fair  Recall:  Fair  Akathisia:  No  Handed:  Right  AIMS (if indicated):     Assets:  Housing Social Support  Sleep:  Number of Hours: 7.5   Current Medications: Current Facility-Administered Medications  Medication Dose Route Frequency Provider Last Rate Last Dose  . acetaminophen (TYLENOL) tablet 650 mg  650 mg Oral Q6H PRN Nanine Means, NP      . alum & mag hydroxide-simeth (MAALOX/MYLANTA) 200-200-20 MG/5ML suspension 30 mL  30 mL Oral Q4H PRN Nanine Means, NP      . clonazePAM Scarlette Calico) tablet 0.5 mg  0.5 mg Oral BID Nanine Means, NP   0.5 mg at 09/29/13 0742  .  gabapentin (NEURONTIN) tablet 900 mg  900 mg Oral QHS Cleotis Nipper, MD      . hydrOXYzine (ATARAX/VISTARIL) tablet 25 mg  25 mg Oral Q6H PRN Nanine Means, NP   25 mg at 09/28/13 1922  . lamoTRIgine (LAMICTAL) tablet 25 mg  25 mg Oral BID Cleotis Nipper, MD      . magnesium hydroxide (MILK OF MAGNESIA) suspension 30 mL  30 mL Oral Daily PRN Nanine Means, NP      . nabumetone (RELAFEN) tablet 500 mg  500 mg Oral BID Nanine Means, NP   500 mg at 09/29/13 0742  . PARoxetine (PAXIL) tablet 40 mg  40 mg Oral QHS Nehemiah Settle, MD   40 mg at 09/28/13 2136    Lab  Results:  Results for orders placed during the hospital encounter of 09/27/13 (from the past 48 hour(s))  URINE RAPID DRUG SCREEN (HOSP PERFORMED)     Status: Abnormal   Collection Time    09/27/13 10:23 PM      Result Value Range   Opiates NONE DETECTED  NONE DETECTED   Cocaine NONE DETECTED  NONE DETECTED   Benzodiazepines NONE DETECTED  NONE DETECTED   Amphetamines NONE DETECTED  NONE DETECTED   Tetrahydrocannabinol POSITIVE (*) NONE DETECTED   Barbiturates NONE DETECTED  NONE DETECTED   Comment:            DRUG SCREEN FOR MEDICAL PURPOSES     ONLY.  IF CONFIRMATION IS NEEDED     FOR ANY PURPOSE, NOTIFY LAB     WITHIN 5 DAYS.                LOWEST DETECTABLE LIMITS     FOR URINE DRUG SCREEN     Drug Class       Cutoff (ng/mL)     Amphetamine      1000     Barbiturate      200     Benzodiazepine   200     Tricyclics       300     Opiates          300     Cocaine          300     THC              50  POCT PREGNANCY, URINE     Status: None   Collection Time    09/27/13 10:42 PM      Result Value Range   Preg Test, Ur NEGATIVE  NEGATIVE   Comment:            THE SENSITIVITY OF THIS     METHODOLOGY IS >24 mIU/mL  CBC WITH DIFFERENTIAL     Status: Abnormal   Collection Time    09/27/13 11:20 PM      Result Value Range   WBC 11.4 (*) 4.0 - 10.5 K/uL   RBC 4.32  3.87 - 5.11 MIL/uL   Hemoglobin 13.2  12.0 - 15.0 g/dL   HCT 16.1  09.6 - 04.5 %   MCV 88.7  78.0 - 100.0 fL   MCH 30.6  26.0 - 34.0 pg   MCHC 34.5  30.0 - 36.0 g/dL   RDW 40.9  81.1 - 91.4 %   Platelets 267  150 - 400 K/uL   Neutrophils Relative % 67  43 - 77 %   Neutro Abs 7.6  1.7 - 7.7 K/uL   Lymphocytes  Relative 26  12 - 46 %   Lymphs Abs 3.0  0.7 - 4.0 K/uL   Monocytes Relative 6  3 - 12 %   Monocytes Absolute 0.7  0.1 - 1.0 K/uL   Eosinophils Relative 2  0 - 5 %   Eosinophils Absolute 0.2  0.0 - 0.7 K/uL   Basophils Relative 0  0 - 1 %   Basophils Absolute 0.0  0.0 - 0.1 K/uL  COMPREHENSIVE  METABOLIC PANEL     Status: Abnormal   Collection Time    09/27/13 11:20 PM      Result Value Range   Sodium 136  135 - 145 mEq/L   Potassium 4.7  3.5 - 5.1 mEq/L   Chloride 100  96 - 112 mEq/L   CO2 28  19 - 32 mEq/L   Glucose, Bld 102 (*) 70 - 99 mg/dL   BUN 5 (*) 6 - 23 mg/dL   Creatinine, Ser 1.61  0.50 - 1.10 mg/dL   Calcium 9.7  8.4 - 09.6 mg/dL   Total Protein 7.6  6.0 - 8.3 g/dL   Albumin 4.0  3.5 - 5.2 g/dL   AST 22  0 - 37 U/L   ALT 18  0 - 35 U/L   Alkaline Phosphatase 120 (*) 39 - 117 U/L   Total Bilirubin 0.2 (*) 0.3 - 1.2 mg/dL   GFR calc non Af Amer >90  >90 mL/min   GFR calc Af Amer >90  >90 mL/min   Comment: (NOTE)     The eGFR has been calculated using the CKD EPI equation.     This calculation has not been validated in all clinical situations.     eGFR's persistently <90 mL/min signify possible Chronic Kidney     Disease.  ETHANOL     Status: None   Collection Time    09/27/13 11:20 PM      Result Value Range   Alcohol, Ethyl (B) <11  0 - 11 mg/dL   Comment:            LOWEST DETECTABLE LIMIT FOR     SERUM ALCOHOL IS 11 mg/dL     FOR MEDICAL PURPOSES ONLY  ACETAMINOPHEN LEVEL     Status: None   Collection Time    09/27/13 11:20 PM      Result Value Range   Acetaminophen (Tylenol), Serum <15.0  10 - 30 ug/mL   Comment:            THERAPEUTIC CONCENTRATIONS VARY     SIGNIFICANTLY. A RANGE OF 10-30     ug/mL MAY BE AN EFFECTIVE     CONCENTRATION FOR MANY PATIENTS.     HOWEVER, SOME ARE BEST TREATED     AT CONCENTRATIONS OUTSIDE THIS     RANGE.     ACETAMINOPHEN CONCENTRATIONS     >150 ug/mL AT 4 HOURS AFTER     INGESTION AND >50 ug/mL AT 12     HOURS AFTER INGESTION ARE     OFTEN ASSOCIATED WITH TOXIC     REACTIONS.  SALICYLATE LEVEL     Status: Abnormal   Collection Time    09/27/13 11:20 PM      Result Value Range   Salicylate Lvl <2.0 (*) 2.8 - 20.0 mg/dL    Physical Findings: AIMS: Facial and Oral Movements Muscles of Facial  Expression: None, normal Lips and Perioral Area: None, normal Jaw: None, normal Tongue: None, normal,Extremity Movements Upper (arms, wrists, hands,  fingers): None, normal Lower (legs, knees, ankles, toes): None, normal, Trunk Movements Neck, shoulders, hips: None, normal, Overall Severity Severity of abnormal movements (highest score from questions above): None, normal Incapacitation due to abnormal movements: None, normal Patient's awareness of abnormal movements (rate only patient's report): No Awareness, Dental Status Current problems with teeth and/or dentures?: No Does patient usually wear dentures?: No  CIWA:    COWS:     Treatment Plan Summary: Medication management 1  Admit for crisis management and stabilization. 2.  Medication management to reduce symptoms to baseline and improved the patient's overall level of functioning.  Closely monitor the side effects, efficacy and therapeutic response of medication.  We have information from the father.  Patient did well on Paxil and Klonopin.  We will start Klonopin 0.5 mg twice a day and continue Paxil 40 mg.  I discussed with the patient about starting mood stabilizer.  Patient agreed.  We will try Lamictal 25 mg twice a day.  I explained the risks and benefits of medication especially side effects including rash and that issue needs to let us know immediately.  We discussed her substance abuse, patient is positive for marijuana. 3.  Treat health problem as indicated. 4.  Developed treatment plan to decrease the risk of relapse upon discharge and to reduce the need for readmission. 5.  Psychosocial education regarding relapse prevention in self-care. 6.  Healthcare followup as needed for medical problems and called consults as indicated.   7.  Increase collateral information. 8.  Restart home medication where appropriate 9. Encouraged to participate and verbalize into group milieu therapy.  Plan:  Medical Decision Making Problem  Points:  Established problem, worsening (2), New problem, with additional work-up planned (4) and Review of psycho-social stressors (1) Data Points:  Review or order clinical lab tests (1) Review and summation of old records (2) Review of medication regiment & side effects (2) Review of new medications or change in dosage (2)  I certify that inpatient services furnished can reasonably be expected to improve the patient's condition.   Tzivia Oneil T. 09/29/2013, 9:46 AM

## 2013-09-30 DIAGNOSIS — F1210 Cannabis abuse, uncomplicated: Secondary | ICD-10-CM

## 2013-09-30 MED ORDER — HYDROXYZINE HCL 50 MG PO TABS
50.0000 mg | ORAL_TABLET | Freq: Four times a day (QID) | ORAL | Status: DC | PRN
  Administered 2013-09-30 – 2013-10-01 (×3): 50 mg via ORAL
  Filled 2013-09-30 (×3): qty 1

## 2013-09-30 MED ORDER — OLANZAPINE 10 MG PO TBDP
10.0000 mg | ORAL_TABLET | Freq: Three times a day (TID) | ORAL | Status: DC | PRN
  Administered 2013-09-30 – 2013-10-01 (×3): 10 mg via ORAL
  Filled 2013-09-30 (×4): qty 1

## 2013-09-30 NOTE — Progress Notes (Signed)
Recreation Therapy Notes  Date: 11.03.2014 Time: 9:30am Location: 400 Hall Dayroom  Group Topic: Wellness  Goal Area(s) Addresses:  Patient will define components of whole wellness. Patient will verbalize benefit of whole wellness.  Behavioral Response: Engaged, Attentive, Appropriate  Intervention: Air traffic controller  Activity: Self-Care Plan. Patients were given a worksheet that helped them identify ways the invest in body, mind, and spirit, as well as support people and goals they want to work towards. Discussion focused on the impact of creating self-care plan to wellness and why wellness is important.    Education: Corporate treasurer, Engineer, materials, Building control surveyor, Coping Skills   Education Outcome: Needs additional education.   Clinical Observations/Feedback: Patient actively engaged in group session, contributing to group discussion as well as completing worksheet as requested. Patient defined spiritual health as being able to recognize herself. Patient gave examples of ways she can invest in all three categories of health, as well as stated that she could hang this self-care plan on her wall to help her stay focused on her goals and her wellness.   Marykay Lex Tyheem Boughner, LRT/CTRS  Deyana Wnuk L 09/30/2013 1:23 PM
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
## 2013-09-30 NOTE — Progress Notes (Signed)
Pt up at med window after evening group with several demands. Angry that her meds aren't "fucking right." Pt screamed, "This place sucks! The food is terrible and you never get my meds right!" After attempts were made to explain to patient her available prn medications, pt stormed off and continued to scream and curse. Observed slamming objects in her room. Further attempts made to de-escalate patient however pt continued to curse and scream stating, "I just need to be fucking tranquilized! Here! I will hold out my arm. You don't even have to put hands on me or hold me down. Otherwise I'm gonna tear this place up. Someone better do something about this shit now!" Peers on hallway visibly upset by patient's tantrum. Pt so loud that patients throughout unit were disturbed. PA paged and orders obtained for geodon 10mg , ativan 2mg  and benadryl 25mg  all IM. Once pt realized she was getting what she wanted, she was immediately cooperative, pleasant and polite. "I will just lie here and wait for it to hit me." Side rails up x 2, fall precautions explained. Pt continues to rest in bed without distress. Lawrence Marseilles

## 2013-09-30 NOTE — Progress Notes (Signed)
Adult Psychoeducational Group Note  Date:  09/30/2013 Time:  9:42 PM  Group Topic/Focus:  Wrap-Up Group:   The focus of this group is to help patients review their daily goal of treatment and discuss progress on daily workbooks.  Participation Level:  Active  Participation Quality:  Appropriate  Affect:  Labile  Cognitive:  Appropriate  Insight: Limited  Engagement in Group:  Engaged  Modes of Intervention:  Support  Additional Comments:  Patient attended and participated in group tonight. She advised that she had a good day today. Prior to coming back into group the patient was agitated and upset about her medication. She stated that she missed her 5pm medication and want to get them now. This Clinical research associate advised her to discussed this with her nurse after group. The patient got upset and throw the meal given to her earlier in the garbage and left the group. She later came back at the end of group and was a different person. She was appropriate as she talked about her day.   Lita Mains [facility] 09/30/2013, 9:42 PM

## 2013-09-30 NOTE — ED Provider Notes (Signed)
Medical screening examination/treatment/procedure(s) were performed by non-physician practitioner and as supervising physician I was immediately available for consultation/collaboration.  EKG Interpretation   None         Kristen N Ward, DO 09/30/13 1158 

## 2013-09-30 NOTE — Progress Notes (Signed)
Patient ID: Gina Dorsey, female   DOB: 14-Apr-1991, 22 y.o.   MRN: [id number] D:Patient has been anxious and agitated today.  Patient was pleasant with MD and nurse during treatment team meeting.  Afterwards, she became very agitated over her klonopin dosage.  She wanted her dosage increased to 1.0 mg from 0.5 mg.  Her father also called and inquired about the dosage.  Reviewed request with MD and informed patient that her klonopin would not be increased.  Also spoke with father about the dosage.  Informed him that zyprexa would be given to her for anxiety.  Patient was given 10 mg zyprexa with good results.  Patient denies SI/HI/AVH.  Father called this nurse back this afternoon and informed her that patient has been repeatedly calling her parents and stating, "I'm going to be good to get out.  Then I'm going to kill myself."  Father was informed that patient would be safe while she was here.  Also informed him that it would be best if he set some boundaries with her as far as the phone conversations went.  Also spoke to patient about remarks she had made to her parents.  Patient stated, "do I look like I would kill myself? Do you see any marks on me?"  A:Continue to monitor medication management and MD orders.  Safety checks completed every 15 minutes per protocol.  R:Patient is redirectable.

## 2013-09-30 NOTE — [facility] Group Notes (Signed)
[facility] LCSW Group Therapy  09/30/2013 1:15 pm  Type of Therapy: Process Group Therapy  Participation Level:  Active  Participation Quality:  Appropriate  Affect:  Flat  Cognitive:  Oriented  Insight:  Improving  Engagement in Group:  Limited  Engagement in Therapy:  Limited  Modes of Intervention:  Activity, Clarification, Education, Problem-solving and Support  Summary of Progress/Problems: Today's group addressed the issue of overcoming obstacles.  Patients were asked to identify their biggest obstacle post d/c that stands in the way of their on-going success, and then problem solve as to how to manage this.  Meleni was in and out several times.  Did not stay long enough to join in the conversation.    Daryel Gerald B 09/30/2013   1:29 PM
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
## 2013-10-01 MED ORDER — OLANZAPINE 10 MG PO TBDP
10.0000 mg | ORAL_TABLET | Freq: Every day | ORAL | Status: DC
  Administered 2013-10-01 – 2013-10-02 (×2): 10 mg via ORAL
  Filled 2013-10-01 (×3): qty 1

## 2013-10-01 NOTE — Progress Notes (Signed)
Patient ID: Gina Dorsey, female   DOB: 05-08-1991, 22 y.o.   MRN: [id number] D:Patient presents with brighter affect today.  She met with MD and expressed her desire for discharge.  She denies any SI/HI/AVH.  Patient states, "the medications I'm on are working for me.  I will not try to kill myself after I leave here.  I couldn't put my best friend and dad through that.  I saw what happened when my cousin committed suicide."  Patient has been attending groups.  She interacts well with staff and others.  She is managing her anxiety.  She has not exhibited any angry outbursts today.  A:Continue to monitor for medication management and MD orders.  Safety checks completed every 15 minutes for safety.  R:Patient's behavior has been appropriate.

## 2013-10-01 NOTE — Progress Notes (Signed)
Patient ID: Gina Dorsey, female   DOB: 04-23-1991, 23 y.o.   MRN: [id number] [facility] MD Progress Note  10/01/2013 11:02 AM [name]  MRN:  [id number] Subjective: "I was extremely anxious yesterday but I got much relaxed after the nurse gave me Zyprexa, I think I want that medication."  Objective: Patient  Reports decreased anxiety, mood lability, racing thoughts, poor impulse control and irritability. She says that she  like her current medication regimen. She reports ongoing feeling of hopelessness, depressed and intermittent  suicidal thoughts with no specific plan. Patient is compliant with her current medications and has not endorsed any adverse reactions. Diagnosis:   DSM5: Schizophrenia Disorders: Substance/Addictive Disorders:  Cannabis Use Disorder - Moderate 9304.30)  Axis I: Bipolar 1 disorder recent episode depressed. Cannabis use disorder.  ADL's:  Intact  Sleep: Fair  Appetite:  Fair  Suicidal Ideation:  Plan:  None Intent:  None Means:  None Homicidal Ideation:  Plan:  None Intent:  None Means:  None AEB (as evidenced by):  Psychiatric Specialty Exam: Review of Systems  Musculoskeletal: Positive for neck pain.  Psychiatric/Behavioral: Positive for depression. The patient is nervous/anxious and has insomnia.        Agitated    Blood pressure 136/90, pulse 99, temperature 98.2 F (36.8 C), temperature source Oral, resp. rate 20, height 5\' 5"  (1.651 m), weight 70.308 kg (155 lb), last menstrual period 09/06/2013.Body mass index is 25.79 kg/(m^2).  General Appearance: Guarded  Eye Contact::  Minimal  Speech:  Pressured  Volume:  Increased  Mood:  Angry, Anxious and Irritable  Affect:  Labile  Thought Process:  Circumstantial, Irrelevant and Loose  Orientation:  Full (Time, Place, and Person)  Thought Content:  Rumination  Suicidal Thoughts:  Yes.  without intent/plan  Homicidal Thoughts:  No  Memory:  Negative  Judgement:  Impaired  Insight:  Lacking   Psychomotor Activity:  Increased  Concentration:  Fair  Recall:  Fair  Akathisia:  No  Handed:  Right  AIMS (if indicated):     Assets:  Housing Social Support  Sleep:  Number of Hours: 4.75   Current Medications: Current Facility-Administered Medications  Medication Dose Route Frequency Provider Last Rate Last Dose  . acetaminophen (TYLENOL) tablet 650 mg  650 mg Oral Q6H PRN Nanine Means, NP   650 mg at 09/30/13 2315  . alum & mag hydroxide-simeth (MAALOX/MYLANTA) 200-200-20 MG/5ML suspension 30 mL  30 mL Oral Q4H PRN Nanine Means, NP      . clonazePAM Scarlette Calico) tablet 0.5 mg  0.5 mg Oral BID Nanine Means, NP   0.5 mg at 10/01/13 0749  . gabapentin (NEURONTIN) capsule 900 mg  900 mg Oral QHS Nehemiah Settle, MD   900 mg at 09/30/13 2243  . hydrOXYzine (ATARAX/VISTARIL) tablet 50 mg  50 mg Oral Q6H PRN Christohper Dube   50 mg at 09/30/13 2340  . lamoTRIgine (LAMICTAL) tablet 25 mg  25 mg Oral BID Cleotis Nipper, MD   25 mg at 10/01/13 0749  . magnesium hydroxide (MILK OF MAGNESIA) suspension 30 mL  30 mL Oral Daily PRN Nanine Means, NP      . nabumetone (RELAFEN) tablet 500 mg  500 mg Oral BID Nanine Means, NP   500 mg at 10/01/13 0749  . OLANZapine zydis (ZYPREXA) disintegrating tablet 10 mg  10 mg Oral Q8H PRN Orin Eberwein   10 mg at 09/30/13 2243  . OLANZapine zydis (ZYPREXA) disintegrating tablet 10 mg  10 mg  Oral QHS Waseem Suess      . PARoxetine (PAXIL) tablet 40 mg  40 mg Oral QHS Nehemiah Settle, MD   40 mg at 09/30/13 2243    Lab Results:  No results found for this or any previous visit (from the past 48 hour(s)).  Physical Findings: AIMS: Facial and Oral Movements Muscles of Facial Expression: None, normal Lips and Perioral Area: None, normal Jaw: None, normal Tongue: None, normal,Extremity Movements Upper (arms, wrists, hands, fingers): None, normal Lower (legs, knees, ankles, toes): None, normal, Trunk Movements Neck, shoulders, hips:  None, normal, Overall Severity Severity of abnormal movements (highest score from questions above): None, normal Incapacitation due to abnormal movements: None, normal Patient's awareness of abnormal movements (rate only patient's report): No Awareness, Dental Status Current problems with teeth and/or dentures?: No Does patient usually wear dentures?: No  CIWA:    COWS:     Treatment Plan Summary: Medication management 1  Admit for crisis management and stabilization. 2.  Medication management to reduce symptoms to baseline and improved the patient's overall level of functioning.  Continue current medication regimen. 3.  Developed treatment plan to decrease the risk of relapse upon discharge and to reduce the need for readmission. 4.  Psychosocial education regarding relapse prevention in self-care. 5.  Healthcare followup as needed for medical problems and called consults as indicated.   6.  Obtain collateral information. 7.  Restart home medication where appropriate 8. Encouraged to participate and verbalize into group milieu therapy. 9. Add Zyprexa Zydis 10mg  po Qhs for mood lability/anxiety.  Plan:  Medical Decision Making Problem Points:  Established problem, improving (1), New problem, with additional work-up planned (4) and Review of psycho-social stressors (1) Data Points:  Review or order clinical lab tests (1) Review and summation of old records (2) Review of medication regiment & side effects (2) Review of new medications or change in dosage (2)  I certify that inpatient services furnished can reasonably be expected to improve the patient's condition.   Thedore Mins, MD 10/01/2013, 11:02 AM

## 2013-10-01 NOTE — [facility] Group Notes (Addendum)
[facility] LCSW Group Therapy  10/01/2013 , 10:44 AM   Type of Therapy:  Group Therapy  Participation Level:  Active  Participation Quality:  Attentive  Affect:  Appropriate  Cognitive:  Alert  Insight:  Improving  Engagement in Therapy:  Engaged  Modes of Intervention:  Discussion, Exploration and Socialization  Summary of Progress/Problems: Today's group focused on the term Diagnosis.  Participants were asked to define the term, and then pronounce whether it is a negative, positive or neutral term.  Rosalena defined diagnosis something a Dr., hospital or ED does.  She went on to say "I will not be defined by my diagnosis," which led to an interesting discussion about what to do when others either want to label or judge based on diagnosis, or want to ignore diagnosis and act like does not exist [denial]. She also talked about loved ones who do not accept diagnosis as an explanation for deficits or behavior. "I just spend short periods of time with my mom because it spirals out of control otherwise." Fannye was engaged throughout, with the exception a 10 minute period when she was gone ["I'll be right back."]  Daryel Gerald B 10/01/2013 , 10:44 AM

## 2013-10-01 NOTE — Progress Notes (Signed)
Adult Psychoeducational Group Note  Date:  10/01/2013 Time:  9:06 PM  Group Topic/Focus:  Wrap-Up Group:   The focus of this group is to help patients review their daily goal of treatment and discuss progress on daily workbooks.  Participation Level:  Active  Participation Quality:  Appropriate  Affect:  Appropriate  Cognitive:  Appropriate  Insight: Appropriate  Engagement in Group:  Engaged  Modes of Intervention:  Discussion  Additional Comments:Thpatient expressed that she received bad news today .But she able to handle the news in a mature way.The patient said that overall her day was good because she able to have self control.  Gina Dorsey 10/01/2013, 9:06 PM

## 2013-10-01 NOTE — [facility] Suicide Risk Assessment (Signed)
[facility] INPATIENT:  Family/Significant Other Suicide Prevention Education  Suicide Prevention Education:  Education Completed; Rilea Arutyunyan, father, [phone] has been identified by the patient as the family member/significant other with whom the patient will be residing, and identified as the person(s) who will aid the patient in the event of a mental health crisis (suicidal ideations/suicide attempt).  With written consent from the patient, the family member/significant other has been provided the following suicide prevention education, prior to the and/or following the discharge of the patient.  The suicide prevention education provided includes the following:  Suicide risk factors  Suicide prevention and interventions  National Suicide Hotline telephone number  [facility] assessment telephone number  [facility] Emergency Assistance 911  [facility] and/or Residential Mobile Crisis Unit telephone number  Request made of family/significant other to:  Remove weapons (e.g., guns, rifles, knives), all items previously/currently identified as safety concern.    Remove drugs/medications (over-the-counter, prescriptions, illicit drugs), all items previously/currently identified as a safety concern.  The family member/significant other verbalizes understanding of the suicide prevention education information provided.  The family member/significant other agrees to remove the items of safety concern listed above.  Daryel Gerald B 10/01/2013, 2:18 PM

## 2013-10-01 NOTE — Progress Notes (Signed)
Patient ID: Gina Dorsey, female   DOB: 09/14/1991, 22 y.o.   MRN: [id number]  D: Pt informed the writer that she slept all day because she took a "new med". Stated she'd like to take her zyprexa at night. Stated she had feels better because he meds are working . Stated the lamictal is helping to stabilize her mood.   A:  Support and encouragement was offered. 15 min checks continued for safety.  R: Pt remains safe.

## 2013-10-01 NOTE — Progress Notes (Signed)
Patient ID: Gina Dorsey, female   DOB: 1991/07/20, 22 y.o.   MRN: [id number] Patient had requested anxiety medication and then went to use the phone first.  When patient came out of the telephone room, she demanded, "damnit I want some anxiety medication.  I'm being evicted from my apartment.  I'm tired of hearing other people around here getting klonopin and shit and I have to take vistaril!!"  Patient screamed and yelled to the point she had to be escorted to her room.  MHT and this nurse sat with patient to deescalate her.  She was administered vistaril and an hour later given zyprexa with good results.  Patient talked with staff.  Advised patient to call her landlady and ask for an extension.  She was also counseled on the use of medication for anxiety. Educated patient on using "the here and now".  Patient focuses on her past failures and also sees negative aspects for her future.  Patient calmed down and went to sleep.

## 2013-10-02 NOTE — Progress Notes (Signed)
Adult Psychoeducational Group Note  Date:  10/02/2013 Time:  9:32 PM  Group Topic/Focus:  Wrap-Up Group:   The focus of this group is to help patients review their daily goal of treatment and discuss progress on daily workbooks.  Participation Level:  Active  Participation Quality:  Appropriate  Affect:  Appropriate  Cognitive:  Appropriate  Insight: Improving  Engagement in Group:  Engaged  Modes of Intervention:  Support  Additional Comments:  Patient attended and participated in group tonight. She reports having a good day. Her Dad and best friend visited, she attended her groups, went for meals, and took her medications. She felt happy today. She has been preparing things today for her discharge tomorrow. Her discharge plans are for her to receive services through [facility] and [facility].  She advised that she fells a big difference now than when she first came in. She believes the medications are working.  Lita Mains [facility] 10/02/2013, 9:32 PM

## 2013-10-02 NOTE — Progress Notes (Signed)
D: Patient denies SI/HI and A/V hallucinations; patient reports sleep is well; reports appetite is good; reports energy level is high ; reports ability to pay attention is good; rates depression as 1/10; rates hopelessness 1/10; patient reports that she is doing well and that the medications are helping  A: Monitored q 15 minutes; patient encouraged to attend groups; patient educated about medications; patient given medications per physician orders; patient encouraged to express feelings and/or concerns  R: Patient does have some insight; patient is elated and animated but appropriate; patient's interaction with staff and peers is appropriate and polite; patient is taking medications as prescribed and tolerating medications; patient is attending all groups and engaging

## 2013-10-02 NOTE — Progress Notes (Signed)
Recreation Therapy Notes   Date: 11.05.2014 Time: 9:30am Location: [address] Dayroom  Group Topic: Communication  Goal Area(s) Addresses:  Patient will effectively communicate with peers in group.  Patient will verbalize benefit of healthy communication. Patient will verbalize positive effect of healthy communication on post d/c goals.   Behavioral Response: Engaged, Appropraite  Intervention: Game  Activity: Random Words. Patients were asked to select a word from the provided container. Using descriptors and/or actions patients were tasked with getting group members to guess the selected word.     Education: Communication, Discharge Planning   Education Outcome: Acknowledges understanding  Clinical Observations/Feedback: Patient actively engaged in group activity. Patient chose to act out and describe selected words for peers to guess. Patient was animated while doing so, smiling brightly and laughing. Patient stated she felt verbally communicating was easier than non-verbally communicating word. Patient additionally stated that one word she selected caused her to think about eating, which distracted her from the game. Patient identified this happens when engaged in conversation with others and it effects her communication. Patient then identified the importance of using good listening skills as part of healthy communication.   Marykay Lex Andrell Bergeson, LRT/CTRS  Deavion Strider L 10/02/2013, 1:42 PM

## 2013-10-02 NOTE — Progress Notes (Signed)
Patient ID: Gina Dorsey, female   DOB: October 24, 1991, 22 y.o.   MRN: [id number]  [facility] MD Progress Note  10/02/2013 2:16 PM Gina Dorsey  MRN:  [id number] Subjective: Patient states "I need to leave today. I am behind on my bills. One of my main stressors is financial. Other than that I feel like my medications have done miracles for me. I'm not crying and screaming like I was before. The zyprexa does not cloud my thinking like the klonopin. I'm ready to get a job and get my bills under control. I think that I would like to be a waitress."  Objective: Patient reports decreased anxiety, mood lability, racing thoughts, poor impulse control and irritability. She says that she  like her current medication regimen. Patient appears to be doing better today but nursing notes reveal that patient had a very angry outburst yesterday afternoon. She became agitated over her financial situation and began to demand more anxiety medication from the nurses. The patient was escorted to her room to talk with staff about her emotions. Today the patient appears very anxious when speaking to writer about wanting to leave but was able to calm down to express her needs very well.   Diagnosis:   DSM5: Schizophrenia Disorders: Substance/Addictive Disorders:  Cannabis Use Disorder - Moderate 9304.30)  Axis I: Bipolar 1 disorder recent episode depressed. Cannabis use disorder.  ADL's:  Intact  Sleep: Fair  Appetite:  Fair  Suicidal Ideation:  Plan:  None Intent:  None Means:  None Homicidal Ideation:  Plan:  None Intent:  None Means:  None AEB (as evidenced by):  Psychiatric Specialty Exam: Review of Systems  Constitutional: Negative.   HENT: Negative.   Eyes: Negative.   Respiratory: Negative.   Cardiovascular: Negative.   Gastrointestinal: Negative.   Genitourinary: Negative.   Musculoskeletal: Positive for neck pain.  Skin: Negative.   Neurological: Negative.   Endo/Heme/Allergies: Negative.    Psychiatric/Behavioral: Positive for depression and substance abuse. Negative for suicidal ideas, hallucinations and memory loss. The patient is nervous/anxious and has insomnia.        Agitated    Blood pressure 143/94, pulse 88, temperature 98.3 F (36.8 C), temperature source Oral, resp. rate 20, height 5\' 5"  (1.651 m), weight 70.308 kg (155 lb), last menstrual period 09/06/2013.Body mass index is 25.79 kg/(m^2).  General Appearance: Guarded  Eye Contact::  Minimal  Speech:  Pressured  Volume:  Increased  Mood:  Anxious  Affect:  Labile  Thought Process:  Circumstantial, Irrelevant and Loose  Orientation:  Full (Time, Place, and Person)  Thought Content:  Rumination  Suicidal Thoughts:  Denies  Homicidal Thoughts:  No  Memory:  Negative  Judgement:  Impaired  Insight:  Lacking  Psychomotor Activity:  Increased  Concentration:  Fair  Recall:  Fair  Akathisia:  No  Handed:  Right  AIMS (if indicated):     Assets:  Housing Social Support  Sleep:  Number of Hours: 5.5   Current Medications: Current Facility-Administered Medications  Medication Dose Route Frequency Provider Last Rate Last Dose  . acetaminophen (TYLENOL) tablet 650 mg  650 mg Oral Q6H PRN Nanine Means, NP   650 mg at 10/01/13 2234  . alum & mag hydroxide-simeth (MAALOX/MYLANTA) 200-200-20 MG/5ML suspension 30 mL  30 mL Oral Q4H PRN Nanine Means, NP      . clonazePAM Scarlette Calico) tablet 0.5 mg  0.5 mg Oral BID Nanine Means, NP   0.5 mg at 10/02/13 0855  . gabapentin (  NEURONTIN) capsule 900 mg  900 mg Oral QHS Nehemiah Settle, MD   900 mg at 10/01/13 2131  . hydrOXYzine (ATARAX/VISTARIL) tablet 50 mg  50 mg Oral Q6H PRN Mojeed Akintayo   50 mg at 10/01/13 2234  . lamoTRIgine (LAMICTAL) tablet 25 mg  25 mg Oral BID Cleotis Nipper, MD   25 mg at 10/02/13 0855  . magnesium hydroxide (MILK OF MAGNESIA) suspension 30 mL  30 mL Oral Daily PRN Nanine Means, NP      . nabumetone (RELAFEN) tablet 500 mg  500 mg Oral  BID Nanine Means, NP   500 mg at 10/02/13 0855  . OLANZapine zydis (ZYPREXA) disintegrating tablet 10 mg  10 mg Oral Q8H PRN Mojeed Akintayo   10 mg at 10/01/13 1410  . OLANZapine zydis (ZYPREXA) disintegrating tablet 10 mg  10 mg Oral QHS Mojeed Akintayo   10 mg at 10/01/13 2131  . PARoxetine (PAXIL) tablet 40 mg  40 mg Oral QHS Nehemiah Settle, MD   40 mg at 10/01/13 2131    Lab Results:  No results found for this or any previous visit (from the past 48 hour(s)).  Physical Findings: AIMS: Facial and Oral Movements Muscles of Facial Expression: None, normal Lips and Perioral Area: None, normal Jaw: None, normal Tongue: None, normal,Extremity Movements Upper (arms, wrists, hands, fingers): None, normal Lower (legs, knees, ankles, toes): None, normal, Trunk Movements Neck, shoulders, hips: None, normal, Overall Severity Severity of abnormal movements (highest score from questions above): None, normal Incapacitation due to abnormal movements: None, normal Patient's awareness of abnormal movements (rate only patient's report): No Awareness, Dental Status Current problems with teeth and/or dentures?: No Does patient usually wear dentures?: No  CIWA:    COWS:     Treatment Plan Summary: Medication management 1  Continue crisis management and stabilization. 2.  Medication management to reduce symptoms to baseline and improved the patient's overall level of functioning. Continue current medication regimen. 3.  Developed treatment plan to decrease the risk of relapse upon discharge and to reduce the need for readmission. 4.  Psychosocial education regarding relapse prevention in self-care. 5.  Healthcare followup as needed for medical problems and called consults as indicated.   6.  Anticipate d/c tomorrow.  7.  Restart home medication where appropriate 8. Encouraged to participate and verbalize into group milieu therapy. 9. Continue Zyprexa Zydis 10mg  po Qhs for mood  lability/anxiety. Continue Klonopin 0.5 mg BID, Paxil 40 mg at bedtime for anxiety. Continue Lamictal 25 mg BID for improved mood stability.   Medical Decision Making Problem Points:  Established problem, improving (1), New problem, with additional work-up planned (4) and Review of psycho-social stressors (1) Data Points:  Review or order clinical lab tests (1) Review and summation of old records (2) Review of medication regiment & side effects (2) Review of new medications or change in dosage (2)  I certify that inpatient services furnished can reasonably be expected to improve the patient's condition.   Fransisca Kaufmann, NP-C 10/02/2013, 2:16 PM

## 2013-10-02 NOTE — BHH Group Notes (Signed)
[facility] Group Therapy  10/02/2013 , 1:27 PM    Type of Therapy:  [facility] Presentation  Participation Level:  Active  Participation Quality:  Attentive  Affect:  Blunted  Cognitive:  Oriented  Insight:  Limited  Engagement in Therapy:  Engaged  Modes of Intervention:  Discussion, Education and Socialization  Summary of Progress/Problems:  Onalee Hua from [employer] came to present his recovery story and play the guitar.   Kynadee paid attention throughout.  She had several exchanges with the presenter with questions and telling him how much she appreciated him coming to tell his story and play.  Daryel Gerald B 10/02/2013 , 1:27 PM

## 2013-10-02 NOTE — Progress Notes (Signed)
Pt laying in bed resting with eyes closed. Respirations even and unlabored. No distress noted.  

## 2013-10-02 NOTE — BHH Group Notes (Signed)
[facility] LCSW Aftercare Discharge Planning Group Note   10/02/2013 9:57 AM  Participation Quality:  Engaged  Mood/Affect:  Appropriate  Depression Rating:  denies  Anxiety Rating:  denies  Thoughts of Suicide:  No Will you contract for safety?   NA  Current AVH:  No  Plan for Discharge/Comments:  She feels that she has been making a lot of progress-likes the medications because "I feel more calm and I am thinking more clearly."  States she will surrender to PO after she leaves her because she has to spend 10 days in jail for probation violation.  Transportation Means:  family  Supports: family  Gina Dorsey, Gina Dorsey

## 2013-10-03 DIAGNOSIS — F314 Bipolar disorder, current episode depressed, severe, without psychotic features: Principal | ICD-10-CM

## 2013-10-03 MED ORDER — OLANZAPINE 10 MG PO TBDP: 10.0000 mg | ORAL_TABLET | Freq: Every day | ORAL | Status: DC

## 2013-10-03 MED ORDER — LEVONORGESTREL-ETHINYL ESTRAD 0.1-20 MG-MCG PO TABS: 1.0000 | ORAL_TABLET | Freq: Every day | ORAL | Status: DC

## 2013-10-03 MED ORDER — PAROXETINE HCL 40 MG PO TABS: 40.0000 mg | ORAL_TABLET | Freq: Every day | ORAL | Status: DC

## 2013-10-03 MED ORDER — LAMOTRIGINE 25 MG PO TABS: 25.0000 mg | ORAL_TABLET | Freq: Two times a day (BID) | ORAL | Status: DC

## 2013-10-03 MED ORDER — CLONAZEPAM 0.5 MG PO TABS: 0.5000 mg | ORAL_TABLET | Freq: Two times a day (BID) | ORAL | Status: DC

## 2013-10-03 MED ORDER — GABAPENTIN 300 MG PO CAPS: 900.0000 mg | ORAL_CAPSULE | Freq: Every day | ORAL | Status: DC
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
## 2013-10-03 NOTE — Progress Notes (Signed)
[facility] Adult Case Management Discharge Plan :  Will you be returning to the same living situation after discharge: Yes,  home At discharge, do you have transportation home?:Yes,  father Do you have the ability to pay for your medications:Yes,  mental health  Release of information consent forms completed and in the chart;  Patient's signature needed at discharge.  Patient to Follow up at: Follow-up Information   Follow up with [facility] On 10/08/2013. (Go to the walk-in clinic between 9 and 11AM on Tuesday for your hospital follow up appointment.  IAt this appointment, ask about DBT group services.  Also, call to reschedule if you are in jail on Tuesday.)    Contact information:   [address]  [place]  [phone]      Patient denies SI/HI:   Yes,  yes    Safety Planning and Suicide Prevention discussed:  Yes,  yes  Daryel Gerald B 10/03/2013, 10:58 AM

## 2013-10-03 NOTE — BHH Suicide Risk Assessment (Signed)
Suicide Risk Assessment  Discharge Assessment     Demographic Factors:  Caucasian, Low socioeconomic status, Unemployed and female  Mental Status Per Nursing Assessment::   On Admission:     Current Mental Status by Physician: patient denies suicidal ideation, intent or plan  Loss Factors: Financial problems/change in socioeconomic status  Historical Factors: Family history of mental illness or substance abuse and Impulsivity  Risk Reduction Factors:   Sense of responsibility to family, Living with another person, especially a relative and Positive social support  Continued Clinical Symptoms:  Alcohol/Substance Abuse/Dependencies  Cognitive Features That Contribute To Risk:  Closed-mindedness Polarized thinking    Suicide Risk:  Minimal: No identifiable suicidal ideation.  Patients presenting with no risk factors but with morbid ruminations; may be classified as minimal risk based on the severity of the depressive symptoms  Discharge Diagnoses:   AXIS I:  Bipolar I disorder, most recent episode (or current) depressed, severe, without mention of psychotic behavior              Cannabis use disorder  AXIS II:  Cluster B Traits AXIS III:   Past Medical History  Diagnosis Date  . Bilateral posterior subcapsular polar cataract   . Anxiety   . IBS (irritable bowel syndrome)     2008  . OCD (obsessive compulsive disorder)   . PTSD (post-traumatic stress disorder)   . Borderline personality disorder    AXIS IV:  other psychosocial or environmental problems and problems related to social environment AXIS V:  61-70 mild symptoms  Plan Of Care/Follow-up recommendations:  Activity:  as tolerated Diet:  healthy Tests:  routine Other:  patient to keep her after care appointment  Is patient on multiple antipsychotic therapies at discharge:  No   Has Patient had three or more failed trials of antipsychotic monotherapy by history:  No  Recommended Plan for Multiple  Antipsychotic Therapies: NA  Thedore Mins, MD 10/03/2013, 9:29 AM

## 2013-10-03 NOTE — Progress Notes (Signed)
Patient ID: Gina Dorsey, female   DOB: 03-11-1991, 22 y.o.   MRN: [id number] D:  Patient's mood is euphoric; affect bright.  Patient was discharged home per MD order.  Patient stated, "I am going to leave here and go straight to get my prescriptions filled."  Patient is feeling more positive about her future.  She denies any SI/HI/AVH.  A:  Reviewed discharge instructions and medications with patient.  Patient understood all follow up instructions.  All personal belongings, medications and prescriptions were returned to patient.  R:  Patient left ambulatory with her father.

## 2013-10-03 NOTE — Progress Notes (Signed)
   D: Pt informed the writer that she's being discharged tomorrow. Pt was more pleasant today than previous day, and endorses feelings of happiness. Stated that she "told her dad that their first stop has to be getting her medicine". Pt stated she plans to continue her medicine in hopes of some day getting a job."   A:  Support and encouragement was offered. 15 min checks continued for safety.  R: Pt remains safe.

## 2013-10-03 NOTE — Tx Team (Signed)
  Interdisciplinary Treatment Plan Update   Date Reviewed:  10/03/2013  Time Reviewed:  10:55 AM  Progress in Treatment:   Attending groups: Yes Participating in groups: Yes Taking medication as prescribed: Yes  Tolerating medication: Yes Family/Significant other contact made: Yes  Patient understands diagnosis: Yes  Discussing patient identified problems/goals with staff: Yes Medical problems stabilized or resolved: Yes Denies suicidal/homicidal ideation: Yes Patient has not harmed self or others: Yes  For review of initial/current patient goals, please see plan of care.  Estimated Length of Stay:  D/C today  Reason for Continuation of Hospitalization:   New Problems/Goals identified:  N/A  Discharge Plan or Barriers:   return home, follow up outpt at [facility]  Additional Comments:  Attendees:  Signature: Thedore Mins, MD 10/03/2013 10:55 AM   Signature: Richelle Ito, LCSW 10/03/2013 10:55 AM  Signature: Fransisca Kaufmann, NP 10/03/2013 10:55 AM  Signature: Joslyn Devon, RN 10/03/2013 10:55 AM  Signature: Liborio Nixon, RN 10/03/2013 10:55 AM  Signature:  10/03/2013 10:55 AM  Signature:   10/03/2013 10:55 AM  Signature:    Signature:    Signature:    Signature:    Signature:    Signature:      Scribe for Treatment Team:   Richelle Ito, LCSW  10/03/2013 10:55 AM

## 2013-10-04 NOTE — Progress Notes (Signed)
Seen and agreed. Annalysa Mohammad, MD 

## 2013-10-04 NOTE — Discharge Summary (Signed)
Seen and agreed. Zyron Deeley, MD 

## 2013-10-07 NOTE — Progress Notes (Signed)
Patient Discharge Instructions:  After Visit Summary (AVS):   Faxed to:  10/07/13 Discharge Summary Note:   Faxed to:  10/07/13 Psychiatric Admission Assessment Note:   Faxed to:  10/07/13 Suicide Risk Assessment - Discharge Assessment:   Faxed to:  10/07/13 Faxed/Sent to the Next Level Care provider:  10/07/13 Faxed to [facility] @ [phone]  Jerelene Redden, 10/07/2013, 4:11 PM

## 2013-11-25 ENCOUNTER — Emergency Department (HOSPITAL_COMMUNITY)
Admission: EM | Admit: 2013-11-25 | Discharge: 2013-11-25 | Disposition: A | Discharge status: Home / Self Care | Payer: 59 | Attending: Emergency Medicine | Admitting: Emergency Medicine

## 2013-11-25 ENCOUNTER — Encounter (HOSPITAL_COMMUNITY): Payer: Self-pay | Admitting: Emergency Medicine

## 2013-11-25 DIAGNOSIS — R102 Pelvic and perineal pain unspecified side: Secondary | ICD-10-CM

## 2013-11-25 DIAGNOSIS — F17200 Nicotine dependence, unspecified, uncomplicated: Secondary | ICD-10-CM | POA: Insufficient documentation

## 2013-11-25 DIAGNOSIS — Z8669 Personal history of other diseases of the nervous system and sense organs: Secondary | ICD-10-CM | POA: Insufficient documentation

## 2013-11-25 DIAGNOSIS — F411 Generalized anxiety disorder: Secondary | ICD-10-CM | POA: Insufficient documentation

## 2013-11-25 DIAGNOSIS — A599 Trichomoniasis, unspecified: Secondary | ICD-10-CM

## 2013-11-25 DIAGNOSIS — N939 Abnormal uterine and vaginal bleeding, unspecified: Secondary | ICD-10-CM

## 2013-11-25 DIAGNOSIS — N898 Other specified noninflammatory disorders of vagina: Secondary | ICD-10-CM

## 2013-11-25 DIAGNOSIS — R630 Anorexia: Secondary | ICD-10-CM | POA: Insufficient documentation

## 2013-11-25 DIAGNOSIS — Z8619 Personal history of other infectious and parasitic diseases: Secondary | ICD-10-CM

## 2013-11-25 DIAGNOSIS — N949 Unspecified condition associated with female genital organs and menstrual cycle: Secondary | ICD-10-CM | POA: Insufficient documentation

## 2013-11-25 DIAGNOSIS — Z79899 Other long term (current) drug therapy: Secondary | ICD-10-CM | POA: Insufficient documentation

## 2013-11-25 DIAGNOSIS — F429 Obsessive-compulsive disorder, unspecified: Secondary | ICD-10-CM | POA: Insufficient documentation

## 2013-11-25 DIAGNOSIS — Z3202 Encounter for pregnancy test, result negative: Secondary | ICD-10-CM | POA: Insufficient documentation

## 2013-11-25 DIAGNOSIS — Z91040 Latex allergy status: Secondary | ICD-10-CM | POA: Insufficient documentation

## 2013-11-25 DIAGNOSIS — Z8719 Personal history of other diseases of the digestive system: Secondary | ICD-10-CM | POA: Insufficient documentation

## 2013-11-25 DIAGNOSIS — A5901 Trichomonal vulvovaginitis: Secondary | ICD-10-CM | POA: Insufficient documentation

## 2013-11-25 DIAGNOSIS — F4310 Post-traumatic stress disorder, unspecified: Secondary | ICD-10-CM | POA: Insufficient documentation

## 2013-11-25 HISTORY — DX: Chlamydial infection, unspecified: A74.9

## 2013-11-25 LAB — URINALYSIS, ROUTINE W REFLEX MICROSCOPIC
Nitrite: NEGATIVE
Protein, ur: NEGATIVE mg/dL
Urobilinogen, UA: 0.2 mg/dL (ref 0.0–1.0)

## 2013-11-25 LAB — COMPREHENSIVE METABOLIC PANEL
ALT: 11 U/L (ref 0–35)
Calcium: 9.7 mg/dL (ref 8.4–10.5)
GFR calc Af Amer: >90 mL/min (ref >90)
GFR calc non Af Amer: >90 mL/min (ref >90)
Glucose, Bld: 99 mg/dL (ref 70–99)
Sodium: 137 mEq/L (ref 135–145)
Total Protein: 7.7 g/dL (ref 6.0–8.3)

## 2013-11-25 LAB — CBC WITH DIFFERENTIAL/PLATELET
Basophils Relative: 0 % (ref 0–1)
Eosinophils Absolute: 0.2 10*3/uL (ref 0.0–0.7)
HCT: 38.8 % (ref 36.0–46.0)
Hemoglobin: 13.2 g/dL (ref 12.0–15.0)
Lymphocytes Relative: 24 % (ref 12–46)
Lymphs Abs: 2.5 10*3/uL (ref 0.7–4.0)
MCHC: 34 g/dL (ref 30.0–36.0)
Monocytes Absolute: 0.5 10*3/uL (ref 0.1–1.0)
Monocytes Relative: 5 % (ref 3–12)
Neutro Abs: 7.2 10*3/uL (ref 1.7–7.7)
Platelets: 311 10*3/uL (ref 150–400)
RBC: 4.35 MIL/uL (ref 3.87–5.11)

## 2013-11-25 LAB — URINE MICROSCOPIC-ADD ON

## 2013-11-25 LAB — POCT PREGNANCY, URINE: Preg Test, Ur: NEGATIVE

## 2013-11-25 LAB — WET PREP, GENITAL
Clue Cells Wet Prep HPF POC: NONE SEEN
WBC, Wet Prep HPF POC: NONE SEEN
Yeast Wet Prep HPF POC: NONE SEEN

## 2013-11-25 MED ORDER — IBUPROFEN 600 MG PO TABS: 600.0000 mg | ORAL_TABLET | Freq: Four times a day (QID) | ORAL | Status: DC | PRN

## 2013-11-25 MED ORDER — METRONIDAZOLE 500 MG PO TABS
2000.0000 mg | ORAL_TABLET | Freq: Once | ORAL | Status: AC
  Administered 2013-11-25: 2000 mg via ORAL
  Filled 2013-11-25: qty 4

## 2013-11-25 MED ORDER — LIDOCAINE HCL 1 % IJ SOLN
INTRAMUSCULAR | Status: AC
  Administered 2013-11-25: 20 mL
  Filled 2013-11-25: qty 20

## 2013-11-25 MED ORDER — CEFTRIAXONE SODIUM 250 MG IJ SOLR
250.0000 mg | Freq: Once | INTRAMUSCULAR | Status: AC
  Administered 2013-11-25: 250 mg via INTRAMUSCULAR
  Filled 2013-11-25: qty 250

## 2013-11-25 MED ORDER — AZITHROMYCIN 250 MG PO TABS
1000.0000 mg | ORAL_TABLET | Freq: Once | ORAL | Status: AC
  Administered 2013-11-25: 1000 mg via ORAL
  Filled 2013-11-25: qty 4

## 2013-11-25 NOTE — ED Notes (Signed)
Pt c/o vaginal discharge, vaginal bleeding, and pelvic pain x 3 weeks.  Pain score 7/10.  Pt sts "it feels like chlamydia symptoms."  Hx of chlamydia.

## 2013-11-25 NOTE — Progress Notes (Signed)
[facility] provided pt with a list of primary care resources.  °

## 2013-11-25 NOTE — ED Provider Notes (Signed)
CSN: [id number]     Arrival date & time 11/25/13  1221 History   First MD Initiated Contact with Patient 11/25/13 1349     Chief Complaint  Patient presents with  . Vaginal Bleeding  . Pelvic Pain   (Consider location/radiation/quality/duration/timing/severity/associated sxs/prior Treatment) Patient is a 22 y.o. female presenting with vaginal bleeding and pelvic pain.  Vaginal Bleeding Associated symptoms: dyspareunia, dysuria and vaginal discharge   Pelvic Pain Pertinent negatives include no chest pain or rash.   22 yo female presents with pelvic pain x 3 wks with associated vaginal bleeding and discharge. Patient states pain is localized to lower pelvis and worse on the Right side. Pain described as sharp and burning, 6/10 with radiation to right lower back. Pain not improved with any OTC medications. Patient admits to dysuria, thick malodorous yellow discharge, and recent vaginal bleeding over the past 3-4 days. LMP was 1 month ago around Thanksgiving day. Patient admits to Mild fever of 99 without chills. Denies N/V/D/C. Admits to pain with intercourse. PMH significant for OCP use, IBS, and Prior Chlamydia infection. Patient states last Chlamydia infection presented in the same way. Patient Denies alcohol or drug use. Patient admits to monogamous relationship without any new sexual partners.   Patient seen by PCP a week ago and states she was started on Cipro for suspected UTI. Sxs have not improved with tx.    Past Medical History  Diagnosis Date  . Bilateral posterior subcapsular polar cataract   . Anxiety   . IBS (irritable bowel syndrome)     2008  . OCD (obsessive compulsive disorder)   . PTSD (post-traumatic stress disorder)   . Borderline personality disorder   . Chlamydia    Past Surgical History  Procedure Laterality Date  . Foot fracture surgery     History reviewed. No pertinent family history. History  Substance Use Topics  . Smoking status: Current Every Day  Smoker -- 0.50 packs/day for 3 years    Types: Cigarettes  . Smokeless tobacco: Never Used  . Alcohol Use: No   OB History   Grav Para Term Preterm Abortions TAB SAB Ect Mult Living                 Review of Systems  Constitutional: Positive for appetite change (poor).  Respiratory: Negative for chest tightness and shortness of breath.   Cardiovascular: Negative for chest pain.  Gastrointestinal: Negative for blood in stool, abdominal distention and rectal pain.  Genitourinary: Positive for dysuria, urgency, frequency, vaginal bleeding, vaginal discharge, vaginal pain, pelvic pain and dyspareunia. Negative for hematuria, flank pain and difficulty urinating.  Skin: Negative for rash.    Allergies  Latex; Morphine and related; Wellbutrin; and Celexa  Home Medications   Current Outpatient Rx  Name  Route  Sig  Dispense  Refill  . Cranberry-Vitamin C-Probiotic (AZO CRANBERRY) 250-30 MG TABS   Oral   Take 2 tablets by mouth every 6 (six) hours as needed (urinary pain).         [name] gabapentin (NEURONTIN) 300 MG capsule   Oral   Take 3 capsules (900 mg total) by mouth at bedtime.   90 capsule   0   . levonorgestrel-ethinyl estradiol (LUTERA) 0.1-20 MG-MCG tablet   Oral   Take 1 tablet by mouth daily.   1 Package   11   . PARoxetine (PAXIL) 40 MG tablet   Oral   Take 1 tablet (40 mg total) by mouth at bedtime.   30  tablet   0   . ibuprofen (ADVIL,MOTRIN) 600 MG tablet   Oral   Take 1 tablet (600 mg total) by mouth every 6 (six) hours as needed.   30 tablet   0    BP 138/74  Pulse 72  Temp(Src) 99.3 F (37.4 C) (Oral)  Resp 20  SpO2 98%  LMP 10/23/2013 Physical Exam  Nursing note and vitals reviewed. Constitutional: She is oriented to person, place, and time. She appears well-developed and well-nourished. No distress.  HENT:  Head: Normocephalic and atraumatic.  Cardiovascular: Normal rate and regular rhythm.  Exam reveals no gallop and no friction rub.   No  murmur heard. Pulmonary/Chest: Effort normal and breath sounds normal. No respiratory distress. She has no wheezes. She has no rales.  Abdominal: Soft. Bowel sounds are normal. She exhibits no distension and no mass. There is tenderness in the right lower quadrant and suprapubic area. There is CVA tenderness (RIGHT side (described as more discomfort than sharp pain)). There is no rigidity, no rebound, no guarding, no tenderness at McBurney's point and negative Murphy's sign.  Genitourinary: Rectum normal and uterus normal. Pelvic exam was performed with patient supine. No labial fusion. There is no rash, tenderness, lesion or injury on the right labia. There is no rash, tenderness, lesion or injury on the left labia. Cervix exhibits discharge (Thin serosanguinous discharge). Cervix exhibits no motion tenderness and no friability. Right adnexum displays tenderness (mild). Right adnexum displays no mass and no fullness. Left adnexum displays no mass, no tenderness and no fullness. No erythema or tenderness around the vagina. No foreign body around the vagina. No signs of injury around the vagina. No vaginal discharge found.  Blood noted in cul de sac surrounding the cervix.   Musculoskeletal: Normal range of motion. She exhibits no edema.  Neurological: She is alert and oriented to person, place, and time.  Skin: Skin is warm and dry. She is not diaphoretic.  Psychiatric: She has a normal mood and affect. Her behavior is normal.    ED Course  Procedures (including critical care time) Labs Review Labs Reviewed  WET PREP, GENITAL - Abnormal; Notable for the following:    Trich, Wet Prep FEW (*)    All other components within normal limits  URINALYSIS, ROUTINE W REFLEX MICROSCOPIC - Abnormal; Notable for the following:    APPearance CLOUDY (*)    Hgb urine dipstick LARGE (*)    Leukocytes, UA MODERATE (*)    All other components within normal limits  COMPREHENSIVE METABOLIC PANEL - Abnormal;  Notable for the following:    Alkaline Phosphatase 126 (*)    Total Bilirubin <0.1 (*)    All other components within normal limits  URINE MICROSCOPIC-ADD ON - Abnormal; Notable for the following:    Squamous Epithelial / LPF MANY (*)    Bacteria, UA FEW (*)    All other components within normal limits  GC/CHLAMYDIA PROBE AMP  URINE CULTURE  CBC WITH DIFFERENTIAL  POCT PREGNANCY, URINE   Imaging Review No results found.  EKG Interpretation   None       MDM   1. Pelvic pain   2. H/O chlamydia infection   3. Vaginal bleeding   4. Vaginal discharge   5. Trichimoniasis    No Leukocytosis. No Anemia. Suspect bleeding may be result of patient's normal menstrual cycle given patient's hx. Plan to treat patient for G/C. UA and Wet prep pending. Will treat additional findings accordingly.   Urine Preg  negative. Wet prep and UA positive for Trichomonas. Will treat patient in ED with 2 g flagyl.   Discussed labs, and exam findings with patient. Advised follow up with PCP in 2 days.Recommend return to ED should symptoms worsen or bleeding should persist. Patient agrees with plan. Discharged in good condition.   Meds given in ED:  Medications  azithromycin (ZITHROMAX) tablet 1,000 mg (1,000 mg Oral Given 11/25/13 1629)  cefTRIAXone (ROCEPHIN) injection 250 mg (250 mg Intramuscular Given 11/25/13 1629)  metroNIDAZOLE (FLAGYL) tablet 2,000 mg (2,000 mg Oral Given 11/25/13 1707)  lidocaine (XYLOCAINE) 1 % (with pres) injection (20 mLs  Given 11/25/13 1629)    Discharge Medication List as of 11/25/2013  5:25 PM         Rudene Anda, PA-C 11/26/13 2326

## 2013-11-26 LAB — URINE CULTURE
Colony Count: NO GROWTH
Culture: NO GROWTH

## 2013-11-26 LAB — GC/CHLAMYDIA PROBE AMP: CT Probe RNA: NEGATIVE

## 2013-11-28 NOTE — ED Provider Notes (Signed)
Medical screening examination/treatment/procedure(s) were performed by non-physician practitioner and as supervising physician I was immediately available for consultation/collaboration.   Dot Lanes, MD 11/28/13 1049

## 2014-03-25 ENCOUNTER — Emergency Department (INDEPENDENT_AMBULATORY_CARE_PROVIDER_SITE_OTHER)
Admission: EM | Admit: 2014-03-25 | Discharge: 2014-03-25 | Disposition: A | Discharge status: Home / Self Care | Payer: Self-pay | Source: Home / Self Care | Attending: Emergency Medicine | Admitting: Emergency Medicine

## 2014-03-25 ENCOUNTER — Encounter (HOSPITAL_COMMUNITY): Payer: Self-pay | Admitting: Emergency Medicine

## 2014-03-25 DIAGNOSIS — J029 Acute pharyngitis, unspecified: Secondary | ICD-10-CM

## 2014-03-25 LAB — POCT RAPID STREP A: Streptococcus, Group A Screen (Direct): NEGATIVE

## 2014-03-25 NOTE — ED Notes (Signed)
Pt c/o left ear pain and ST onset 3 days Reports she was seen by PCP last month for similar sx Given Cipro 500mg ; she had 2 pills left over that she took yest Swelling of gland has decreased Denies f/v/n/d Alert w/no signs of acute distress.

## 2014-03-25 NOTE — ED Provider Notes (Signed)
Medical screening examination/treatment/procedure(s) were performed by a resident physician and as supervising physician I was immediately available for consultation/collaboration.  Philipp Deputy, M.D.   Harden Mo, MD 03/25/14 2213

## 2014-03-25 NOTE — ED Provider Notes (Signed)
CSN: [id number]     Arrival date & time 03/25/14  1838 History   First MD Initiated Contact with Patient 03/25/14 2030     Chief Complaint  Patient presents with  . Otalgia  . Sore Throat   (Consider location/radiation/quality/duration/timing/severity/associated sxs/prior Treatment) HPI  URI Symptoms Cough: yes productive no Runny Nose: no Sore Throat: yes - with swelling of the tonsil x 3 days Sinus Pressure: yes Shortness of Breath: no Fever/Chills: no Nausea/Vomiting; no Diarrhea: no  Course: improved Treatments Tried: two tablets of cipro from previous prescription  Exacerbating: unsure   Past Medical History  Diagnosis Date  . Bilateral posterior subcapsular polar cataract   . Anxiety   . IBS (irritable bowel syndrome)     2008  . OCD (obsessive compulsive disorder)   . PTSD (post-traumatic stress disorder)   . Borderline personality disorder   . Chlamydia    Past Surgical History  Procedure Laterality Date  . Foot fracture surgery     No family history on file. History  Substance Use Topics  . Smoking status: Current Every Day Smoker -- 0.50 packs/day for 3 years    Types: Cigarettes  . Smokeless tobacco: Never Used  . Alcohol Use: No   OB History   Grav Para Term Preterm Abortions TAB SAB Ect Mult Living                 Review of Systems See HPI Allergies  Latex; Morphine and related; Wellbutrin; and Celexa  Home Medications   Prior to Admission medications   Medication Sig Start Date End Date Taking? Authorizing Provider  Cranberry-Vitamin C-Probiotic (AZO CRANBERRY) 250-30 MG TABS Take 2 tablets by mouth every 6 (six) hours as needed (urinary pain).    Historical Provider, MD  gabapentin (NEURONTIN) 300 MG capsule Take 3 capsules (900 mg total) by mouth at bedtime. 10/03/13   Elmarie Shiley, NP  ibuprofen (ADVIL,MOTRIN) 600 MG tablet Take 1 tablet (600 mg total) by mouth every 6 (six) hours as needed. 11/25/13   Sherrie George, PA-C   levonorgestrel-ethinyl estradiol (LUTERA) 0.1-20 MG-MCG tablet Take 1 tablet by mouth daily. 10/03/13   Elmarie Shiley, NP  PARoxetine (PAXIL) 40 MG tablet Take 1 tablet (40 mg total) by mouth at bedtime. 10/03/13   Elmarie Shiley, NP   BP 113/55  Pulse 61  Temp(Src) 98.5 F (36.9 C) (Oral)  Resp 18  SpO2 100%  LMP 03/18/2014 Physical Exam LNL:GXQJJ white female, well appearing, NAD, pleasant and conversant HEENT: NCAT, PERRLA, EOMI, OP clear and moist, no oropharyngeal exudate, no lymphadenopathy, no thyroid tenderness, enlargement, or nodules, neck with normal ROM, no meningismus, TM reflective without effusion  CV: RRR, no m/r/g, no JVD or carotid bruits Pulm: normal WOB, CTA-B Skin: warm, dry, no rashes   ED Course  Procedures (including critical care time) Labs Review Labs Reviewed  POCT RAPID STREP A ([facility] CARE ONLY)   Strep negative   Imaging Review No results found.   MDM   1. Viral pharyngitis    Symptomatic treatment only. Precautions for return given.     Angelica Ran, MD 03/25/14 2043

## 2014-03-25 NOTE — Discharge Instructions (Signed)
Aby,   I am glad that your tonsils are better. Please continue to gargle with warm water and use Aleve as needed for pain. If she develop worsening symptoms or fever, and please return.  Sincerely,   Dr. Maricela Bo

## 2014-03-27 LAB — CULTURE, GROUP A STREP

## 2014-09-08 ENCOUNTER — Encounter (HOSPITAL_BASED_OUTPATIENT_CLINIC_OR_DEPARTMENT_OTHER): Payer: Self-pay | Admitting: Emergency Medicine

## 2014-09-08 ENCOUNTER — Emergency Department (HOSPITAL_BASED_OUTPATIENT_CLINIC_OR_DEPARTMENT_OTHER)
Admission: EM | Admit: 2014-09-08 | Discharge: 2014-09-08 | Discharge status: Against Medical Advice | Payer: Self-pay | Attending: Emergency Medicine | Admitting: Emergency Medicine

## 2014-09-08 DIAGNOSIS — R0602 Shortness of breath: Secondary | ICD-10-CM | POA: Insufficient documentation

## 2014-09-08 DIAGNOSIS — J040 Acute laryngitis: Secondary | ICD-10-CM | POA: Insufficient documentation

## 2014-09-08 DIAGNOSIS — J029 Acute pharyngitis, unspecified: Secondary | ICD-10-CM | POA: Insufficient documentation

## 2014-09-08 DIAGNOSIS — Z720 Tobacco use: Secondary | ICD-10-CM | POA: Insufficient documentation

## 2014-09-08 NOTE — ED Notes (Signed)
Pt seen leaving department by secretary.

## 2014-09-08 NOTE — ED Notes (Signed)
Pt reports sore throat and shortness of breath.  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  Reports throat hurts down into her chest. Pt does have a raspy voice during triage.

## 2014-10-04 ENCOUNTER — Emergency Department (HOSPITAL_BASED_OUTPATIENT_CLINIC_OR_DEPARTMENT_OTHER)
Admission: EM | Admit: 2014-10-04 | Discharge: 2014-10-04 | Disposition: A | Discharge status: Home / Self Care | Payer: Self-pay | Attending: Emergency Medicine | Admitting: Emergency Medicine

## 2014-10-04 ENCOUNTER — Encounter (HOSPITAL_BASED_OUTPATIENT_CLINIC_OR_DEPARTMENT_OTHER): Payer: Self-pay | Admitting: *Deleted

## 2014-10-04 DIAGNOSIS — N390 Urinary tract infection, site not specified: Secondary | ICD-10-CM | POA: Insufficient documentation

## 2014-10-04 DIAGNOSIS — Z91040 Latex allergy status: Secondary | ICD-10-CM | POA: Insufficient documentation

## 2014-10-04 DIAGNOSIS — Z720 Tobacco use: Secondary | ICD-10-CM | POA: Insufficient documentation

## 2014-10-04 DIAGNOSIS — Z8619 Personal history of other infectious and parasitic diseases: Secondary | ICD-10-CM | POA: Insufficient documentation

## 2014-10-04 DIAGNOSIS — F419 Anxiety disorder, unspecified: Secondary | ICD-10-CM | POA: Insufficient documentation

## 2014-10-04 DIAGNOSIS — Z79899 Other long term (current) drug therapy: Secondary | ICD-10-CM | POA: Insufficient documentation

## 2014-10-04 DIAGNOSIS — Z3202 Encounter for pregnancy test, result negative: Secondary | ICD-10-CM | POA: Insufficient documentation

## 2014-10-04 DIAGNOSIS — Z8669 Personal history of other diseases of the nervous system and sense organs: Secondary | ICD-10-CM | POA: Insufficient documentation

## 2014-10-04 LAB — URINALYSIS, ROUTINE W REFLEX MICROSCOPIC
BILIRUBIN URINE: NEGATIVE
GLUCOSE, UA: NEGATIVE mg/dL
KETONES UR: NEGATIVE mg/dL
Nitrite: NEGATIVE
PH: 6.5 (ref 5.0–8.0)
Protein, ur: NEGATIVE mg/dL
Specific Gravity, Urine: 1.003 — ABNORMAL LOW (ref 1.005–1.030)
Urobilinogen, UA: 0.2 mg/dL (ref 0.0–1.0)

## 2014-10-04 LAB — URINE MICROSCOPIC-ADD ON

## 2014-10-04 LAB — PREGNANCY, URINE: Preg Test, Ur: NEGATIVE

## 2014-10-04 MED ORDER — SULFAMETHOXAZOLE-TRIMETHOPRIM 800-160 MG PO TABS: 1.0000 | ORAL_TABLET | Freq: Two times a day (BID) | ORAL | Status: DC

## 2014-10-04 NOTE — ED Provider Notes (Signed)
CSN: [id number]     Arrival date & time 10/04/14  1005 History   First MD Initiated Contact with Patient 10/04/14 1205     Chief Complaint  Patient presents with  . Urinary Tract Infection     (Consider location/radiation/quality/duration/timing/severity/associated sxs/prior Treatment) Patient is a 23 y.o. female presenting with abdominal pain. The history is provided by the patient. No language interpreter was used.  Abdominal Pain Pain location:  Suprapubic Pain quality: aching   Pain radiates to:  Does not radiate Pain severity:  Moderate Duration:  3 days Timing:  Constant Progression:  Worsening Chronicity:  New Context: not medication withdrawal, not previous surgeries, not recent illness, not recent sexual activity, not sick contacts and not suspicious food intake   Relieved by:  Nothing Worsened by:  Nothing tried Ineffective treatments:  OTC medications (azo) Associated symptoms: no anorexia, no chest pain, no chills, no constipation, no cough, no diarrhea, no dysuria, no fatigue, no fever, no nausea, no shortness of breath, no sore throat, no vaginal bleeding, no vaginal discharge and no vomiting   Risk factors: no alcohol abuse, no aspirin use, not elderly, has not had multiple surgeries, no NSAID use, not obese, not pregnant and no recent hospitalization     Past Medical History  Diagnosis Date  . Bilateral posterior subcapsular polar cataract   . Anxiety   . IBS (irritable bowel syndrome)     2008  . OCD (obsessive compulsive disorder)   . PTSD (post-traumatic stress disorder)   . Borderline personality disorder   . Chlamydia    Past Surgical History  Procedure Laterality Date  . Foot fracture surgery     No family history on file. History  Substance Use Topics  . Smoking status: Current Every Day Smoker -- 0.50 packs/day for 3 years    Types: Cigarettes  . Smokeless tobacco: Never Used  . Alcohol Use: No   OB History    No data available     Review  of Systems  Constitutional: Negative for fever, chills, diaphoresis, activity change, appetite change and fatigue.  HENT: Negative for congestion, facial swelling, rhinorrhea and sore throat.   Eyes: Negative for photophobia and discharge.  Respiratory: Negative for cough, chest tightness and shortness of breath.   Cardiovascular: Negative for chest pain, palpitations and leg swelling.  Gastrointestinal: Positive for abdominal pain. Negative for nausea, vomiting, diarrhea, constipation and anorexia.  Endocrine: Negative for polydipsia and polyuria.  Genitourinary: Negative for dysuria, frequency, vaginal bleeding, vaginal discharge, difficulty urinating and pelvic pain.  Musculoskeletal: Negative for back pain, arthralgias, neck pain and neck stiffness.  Skin: Negative for color change and wound.  Allergic/Immunologic: Negative for immunocompromised state.  Neurological: Negative for facial asymmetry, weakness, numbness and headaches.  Hematological: Does not bruise/bleed easily.  Psychiatric/Behavioral: Negative for confusion and agitation.      Allergies  Latex; Morphine and related; Mucinex; Wellbutrin; and Celexa  Home Medications   Prior to Admission medications   Medication Sig Start Date End Date Taking? Authorizing Provider  Cranberry-Vitamin C-Probiotic (AZO CRANBERRY) 250-30 MG TABS Take 2 tablets by mouth every 6 (six) hours as needed (urinary pain).    Historical Provider, MD  gabapentin (NEURONTIN) 300 MG capsule Take 3 capsules (900 mg total) by mouth at bedtime. 10/03/13   Elmarie Shiley, NP  ibuprofen (ADVIL,MOTRIN) 600 MG tablet Take 1 tablet (600 mg total) by mouth every 6 (six) hours as needed. 11/25/13   Sherrie George, PA-C  levonorgestrel-ethinyl estradiol (LUTERA) 0.1-20 MG-MCG  tablet Take 1 tablet by mouth daily. 10/03/13   Elmarie Shiley, NP  PARoxetine (PAXIL) 40 MG tablet Take 1 tablet (40 mg total) by mouth at bedtime. 10/03/13   Elmarie Shiley, NP   sulfamethoxazole-trimethoprim (SEPTRA DS) 800-160 MG per tablet Take 1 tablet by mouth 2 (two) times daily. 10/04/14   Ernestina Patches, MD  topiramate (TOPAMAX) 25 MG capsule Take 25 mg by mouth 2 (two) times daily.    Historical Provider, MD   BP 130/71 mmHg  Pulse 70  Temp(Src) 97.7 F (36.5 C) (Oral)  Resp 16  Ht 5\' 4"  (1.626 m)  Wt 165 lb (74.844 kg)  BMI 28.31 kg/m2  SpO2 100%  LMP 08/26/2014 Physical Exam  Constitutional: She is oriented to person, place, and time. She appears well-developed and well-nourished. No distress.  HENT:  Head: Normocephalic and atraumatic.  Mouth/Throat: No oropharyngeal exudate.  Eyes: Pupils are equal, round, and reactive to light.  Neck: Normal range of motion. Neck supple.  Cardiovascular: Normal rate, regular rhythm and normal heart sounds.  Exam reveals no gallop and no friction rub.   No murmur heard. Pulmonary/Chest: Effort normal and breath sounds normal. No respiratory distress. She has no wheezes. She has no rales.  Abdominal: Soft. Bowel sounds are normal. She exhibits no distension and no mass. There is tenderness in the suprapubic area. There is no rebound and no guarding.  Musculoskeletal: Normal range of motion. She exhibits no edema or tenderness.  Neurological: She is alert and oriented to person, place, and time.  Skin: Skin is warm and dry.  Psychiatric: She has a normal mood and affect.    ED Course  Procedures (including critical care time) Labs Review Labs Reviewed  URINALYSIS, ROUTINE W REFLEX MICROSCOPIC - Abnormal; Notable for the following:    APPearance CLOUDY (*)    Specific Gravity, Urine 1.003 (*)    Hgb urine dipstick LARGE (*)    Leukocytes, UA LARGE (*)    All other components within normal limits  URINE MICROSCOPIC-ADD ON - Abnormal; Notable for the following:    Bacteria, UA FEW (*)    All other components within normal limits  PREGNANCY, URINE    Imaging Review No results found.   EKG  Interpretation None      MDM   Final diagnoses:  UTI (lower urinary tract infection)    Pt is a 23 y.o. female with Pmhx as above who presents with 3 days of dysuria, urgency and frequency. No fever, chills, back pain, nausea vomiting, diarrhea, vaginal bleeding or discharge.  Abdominal exam is benign. UA consistent with UTI. Plantar pregnancy is negative. We'll discharge him on 3 days of Bactrim. Return precautions given for new or worsening symptoms      Ernestina Patches, MD 10/05/14 1606

## 2014-10-04 NOTE — Discharge Instructions (Signed)

## 2014-10-04 NOTE — ED Notes (Signed)
Patient states that she woke up this morning with painful urination. Has had UTI's in the past. Is taking AZO and left over pyridium

## 2014-10-06 ENCOUNTER — Emergency Department (HOSPITAL_BASED_OUTPATIENT_CLINIC_OR_DEPARTMENT_OTHER)
Admission: EM | Admit: 2014-10-06 | Discharge: 2014-10-06 | Disposition: A | Discharge status: Home / Self Care | Payer: Self-pay | Attending: Emergency Medicine | Admitting: Emergency Medicine

## 2014-10-06 ENCOUNTER — Encounter (HOSPITAL_BASED_OUTPATIENT_CLINIC_OR_DEPARTMENT_OTHER): Payer: Self-pay | Admitting: *Deleted

## 2014-10-06 DIAGNOSIS — M549 Dorsalgia, unspecified: Secondary | ICD-10-CM | POA: Insufficient documentation

## 2014-10-06 DIAGNOSIS — N390 Urinary tract infection, site not specified: Secondary | ICD-10-CM

## 2014-10-06 DIAGNOSIS — F419 Anxiety disorder, unspecified: Secondary | ICD-10-CM | POA: Insufficient documentation

## 2014-10-06 DIAGNOSIS — Z8619 Personal history of other infectious and parasitic diseases: Secondary | ICD-10-CM | POA: Insufficient documentation

## 2014-10-06 DIAGNOSIS — Z91040 Latex allergy status: Secondary | ICD-10-CM | POA: Insufficient documentation

## 2014-10-06 DIAGNOSIS — Z3202 Encounter for pregnancy test, result negative: Secondary | ICD-10-CM | POA: Insufficient documentation

## 2014-10-06 DIAGNOSIS — Z720 Tobacco use: Secondary | ICD-10-CM | POA: Insufficient documentation

## 2014-10-06 DIAGNOSIS — Z792 Long term (current) use of antibiotics: Secondary | ICD-10-CM | POA: Insufficient documentation

## 2014-10-06 DIAGNOSIS — Z79899 Other long term (current) drug therapy: Secondary | ICD-10-CM | POA: Insufficient documentation

## 2014-10-06 DIAGNOSIS — Z8719 Personal history of other diseases of the digestive system: Secondary | ICD-10-CM | POA: Insufficient documentation

## 2014-10-06 LAB — URINALYSIS, ROUTINE W REFLEX MICROSCOPIC
Bilirubin Urine: NEGATIVE
Glucose, UA: NEGATIVE mg/dL
Hgb urine dipstick: NEGATIVE
Ketones, ur: NEGATIVE mg/dL
NITRITE: POSITIVE — AB
Protein, ur: NEGATIVE mg/dL
Specific Gravity, Urine: 1.005 (ref 1.005–1.030)
UROBILINOGEN UA: 1 mg/dL (ref 0.0–1.0)
pH: 6 (ref 5.0–8.0)

## 2014-10-06 LAB — URINE MICROSCOPIC-ADD ON

## 2014-10-06 LAB — PREGNANCY, URINE: PREG TEST UR: NEGATIVE

## 2014-10-06 MED ORDER — PHENAZOPYRIDINE HCL 100 MG PO TABS
200.0000 mg | ORAL_TABLET | Freq: Once | ORAL | Status: DC
  Filled 2014-10-06: qty 2

## 2014-10-06 MED ORDER — NITROFURANTOIN MONOHYD MACRO 100 MG PO CAPS
100.0000 mg | ORAL_CAPSULE | Freq: Two times a day (BID) | ORAL | Status: DC
Start: 2014-10-06 — End: 2015-10-23

## 2014-10-06 MED ORDER — NAPROXEN 375 MG PO TABS
375.0000 mg | ORAL_TABLET | Freq: Two times a day (BID) | ORAL | Status: DC
Start: 2014-10-06 — End: 2015-10-23

## 2014-10-06 MED ORDER — PHENAZOPYRIDINE HCL 200 MG PO TABS: 200.0000 mg | ORAL_TABLET | Freq: Three times a day (TID) | ORAL | Status: DC

## 2014-10-06 MED ORDER — NITROFURANTOIN MONOHYD MACRO 100 MG PO CAPS
100.0000 mg | ORAL_CAPSULE | Freq: Once | ORAL | Status: AC
  Administered 2014-10-06: 100 mg via ORAL
  Filled 2014-10-06: qty 1

## 2014-10-06 NOTE — ED Provider Notes (Signed)
CSN: [id number]     Arrival date & time 10/06/14  2213 History  This chart was scribed for Kenedee Molesky Alfonso Patten, MD by Randa Evens, ED Scribe. This patient was seen in room MH01/[facility]01 and the patient's care was started at 11:02 PM.      Chief Complaint  Patient presents with  . Urinary Tract Infection   Patient is a 23 y.o. female presenting with urinary tract infection. The history is provided by the patient. No language interpreter was used.  Urinary Tract Infection The current episode started more than 1 week ago. The problem occurs rarely. The problem has been gradually worsening. Pertinent negatives include no chest pain, no headaches and no shortness of breath. Nothing aggravates the symptoms. Nothing relieves the symptoms. Treatments tried: bactrim. The treatment provided no relief.   HPI Comments: Gina Dorsey is a 23 y.o. female who presents to the Emergency Department complaining of gradually worsening  UTI onset 1 week ago. She states she has associated dysuria. She states she now has right sided back pain. Pt is also complain of abdominal distention. Pt states she presented to ED for similar symptoms 2 days ago. She states she was treated with antibiotics that not provided no relief. She states she has taken pyridium with no relief PTA. Pt doesn't report any other relates symptoms.    Past Medical History  Diagnosis Date  . Bilateral posterior subcapsular polar cataract   . Anxiety   . IBS (irritable bowel syndrome)     2008  . OCD (obsessive compulsive disorder)   . PTSD (post-traumatic stress disorder)   . Borderline personality disorder   . Chlamydia    Past Surgical History  Procedure Laterality Date  . Foot fracture surgery     No family history on file. History  Substance Use Topics  . Smoking status: Current Every Day Smoker -- 0.50 packs/day for 3 years    Types: Cigarettes  . Smokeless tobacco: Never Used  . Alcohol Use: No   OB History    No data  available     Review of Systems  Constitutional: Negative for fever.  Respiratory: Negative for shortness of breath.   Cardiovascular: Negative for chest pain.  Gastrointestinal: Positive for abdominal distention.  Genitourinary: Positive for dysuria.  Musculoskeletal: Positive for back pain.  Neurological: Negative for headaches.  All other systems reviewed and are negative.    Allergies  Latex; Morphine and related; Mucinex; Wellbutrin; and Celexa  Home Medications   Prior to Admission medications   Medication Sig Start Date End Date Taking? Authorizing Provider  Cranberry-Vitamin C-Probiotic (AZO CRANBERRY) 250-30 MG TABS Take 2 tablets by mouth every 6 (six) hours as needed (urinary pain).    Historical Provider, MD  gabapentin (NEURONTIN) 300 MG capsule Take 3 capsules (900 mg total) by mouth at bedtime. 10/03/13   Elmarie Shiley, NP  ibuprofen (ADVIL,MOTRIN) 600 MG tablet Take 1 tablet (600 mg total) by mouth every 6 (six) hours as needed. 11/25/13   Sherrie George, PA-C  levonorgestrel-ethinyl estradiol (LUTERA) 0.1-20 MG-MCG tablet Take 1 tablet by mouth daily. 10/03/13   Elmarie Shiley, NP  PARoxetine (PAXIL) 40 MG tablet Take 1 tablet (40 mg total) by mouth at bedtime. 10/03/13   Elmarie Shiley, NP  sulfamethoxazole-trimethoprim (SEPTRA DS) 800-160 MG per tablet Take 1 tablet by mouth 2 (two) times daily. 10/04/14   Ernestina Patches, MD  topiramate (TOPAMAX) 25 MG capsule Take 25 mg by mouth 2 (two) times daily.  Historical Provider, MD   Triage Vitals: BP 132/83 mmHg  Pulse 87  Temp(Src) 98.4 F (36.9 C) (Oral)  Resp 22  Ht 5\' 5"  (1.651 m)  Wt 165 lb (74.844 kg)  BMI 27.46 kg/m2  SpO2 100%  LMP 08/26/2014  Physical Exam  Constitutional: She is oriented to person, place, and time. She appears well-developed and well-nourished. No distress.  HENT:  Head: Normocephalic and atraumatic.  Mouth/Throat: Oropharynx is clear and moist. No oropharyngeal exudate, posterior  oropharyngeal edema or posterior oropharyngeal erythema.  Eyes: Conjunctivae and EOM are normal. Pupils are equal, round, and reactive to light.  Neck: Normal range of motion. Neck supple. No tracheal deviation present.  Cardiovascular: Normal rate and regular rhythm.   Pulmonary/Chest: Effort normal and breath sounds normal. No respiratory distress. She has no wheezes. She has no rales.  Abdominal: Soft. Bowel sounds are increased. There is no tenderness. There is no rebound and no guarding.  Hyperactive bowel sounds   Musculoskeletal: Normal range of motion.  Neurological: She is alert and oriented to person, place, and time. She has normal reflexes.  Skin: Skin is warm and dry.  Psychiatric: Her behavior is normal.  Nursing note and vitals reviewed.   ED Course  Procedures (including critical care time) DIAGNOSTIC STUDIES: Oxygen Saturation is 100% on RA, normal by my interpretation.    COORDINATION OF CARE: 11:12 PM-Discussed treatment plan which includes Macrobid with pt at bedside and pt agreed to plan.     Labs Review Labs Reviewed  URINALYSIS, ROUTINE W REFLEX MICROSCOPIC - Abnormal; Notable for the following:    Color, Urine ORANGE (*)    Nitrite POSITIVE (*)    Leukocytes, UA TRACE (*)    All other components within normal limits  URINE MICROSCOPIC-ADD ON - Abnormal; Notable for the following:    Squamous Epithelial / LPF FEW (*)    All other components within normal limits  URINE CULTURE  PREGNANCY, URINE    Imaging Review No results found.   EKG Interpretation None      MDM   Final diagnoses:  None   Persistent UTI will treat with macrobid which has less resistance in the community.  Follow up with your PMD this week.  Strict return precautions given   I personally performed the services described in this documentation, which was scribed in my presence. The recorded information has been reviewed and is accurate.       Carlisle Beers,  MD 10/06/14 [phone]

## 2014-10-06 NOTE — ED Notes (Signed)
Was treated for a UTI 2 days ago. States her pain is much worse.

## 2014-10-06 NOTE — ED Notes (Signed)
Pt very anxious and aggravated in the waiting room.  Pt pacing, noted to be cursing very loudly.

## 2014-10-07 ENCOUNTER — Emergency Department (HOSPITAL_BASED_OUTPATIENT_CLINIC_OR_DEPARTMENT_OTHER)
Admission: EM | Admit: 2014-10-07 | Discharge: 2014-10-07 | Disposition: A | Discharge status: Home / Self Care | Payer: Self-pay | Attending: Emergency Medicine | Admitting: Emergency Medicine

## 2014-10-07 ENCOUNTER — Emergency Department (HOSPITAL_COMMUNITY)
Admission: EM | Admit: 2014-10-07 | Discharge: 2014-10-07 | Discharge status: Against Medical Advice | Payer: Self-pay | Attending: Emergency Medicine | Admitting: Emergency Medicine

## 2014-10-07 ENCOUNTER — Encounter (HOSPITAL_COMMUNITY): Payer: Self-pay | Admitting: Emergency Medicine

## 2014-10-07 ENCOUNTER — Encounter (HOSPITAL_BASED_OUTPATIENT_CLINIC_OR_DEPARTMENT_OTHER): Payer: Self-pay | Admitting: Emergency Medicine

## 2014-10-07 DIAGNOSIS — N390 Urinary tract infection, site not specified: Secondary | ICD-10-CM | POA: Insufficient documentation

## 2014-10-07 DIAGNOSIS — F4310 Post-traumatic stress disorder, unspecified: Secondary | ICD-10-CM | POA: Insufficient documentation

## 2014-10-07 DIAGNOSIS — Z8619 Personal history of other infectious and parasitic diseases: Secondary | ICD-10-CM | POA: Insufficient documentation

## 2014-10-07 DIAGNOSIS — X58XXXA Exposure to other specified factors, initial encounter: Secondary | ICD-10-CM | POA: Insufficient documentation

## 2014-10-07 DIAGNOSIS — Z8719 Personal history of other diseases of the digestive system: Secondary | ICD-10-CM | POA: Insufficient documentation

## 2014-10-07 DIAGNOSIS — Z91040 Latex allergy status: Secondary | ICD-10-CM | POA: Insufficient documentation

## 2014-10-07 DIAGNOSIS — Z79899 Other long term (current) drug therapy: Secondary | ICD-10-CM | POA: Insufficient documentation

## 2014-10-07 DIAGNOSIS — F42 Obsessive-compulsive disorder: Secondary | ICD-10-CM | POA: Insufficient documentation

## 2014-10-07 DIAGNOSIS — Y939 Activity, unspecified: Secondary | ICD-10-CM | POA: Insufficient documentation

## 2014-10-07 DIAGNOSIS — Y929 Unspecified place or not applicable: Secondary | ICD-10-CM | POA: Insufficient documentation

## 2014-10-07 DIAGNOSIS — F419 Anxiety disorder, unspecified: Secondary | ICD-10-CM | POA: Insufficient documentation

## 2014-10-07 DIAGNOSIS — Z792 Long term (current) use of antibiotics: Secondary | ICD-10-CM | POA: Insufficient documentation

## 2014-10-07 DIAGNOSIS — Z720 Tobacco use: Secondary | ICD-10-CM | POA: Insufficient documentation

## 2014-10-07 DIAGNOSIS — Z8669 Personal history of other diseases of the nervous system and sense organs: Secondary | ICD-10-CM | POA: Insufficient documentation

## 2014-10-07 DIAGNOSIS — T887XXA Unspecified adverse effect of drug or medicament, initial encounter: Secondary | ICD-10-CM

## 2014-10-07 DIAGNOSIS — T7840XA Allergy, unspecified, initial encounter: Secondary | ICD-10-CM | POA: Insufficient documentation

## 2014-10-07 MED ORDER — FLUCONAZOLE 150 MG PO TABS: 150.0000 mg | ORAL_TABLET | Freq: Once | ORAL | Status: DC

## 2014-10-07 MED ORDER — DIPHENHYDRAMINE HCL 25 MG PO CAPS
25.0000 mg | ORAL_CAPSULE | Freq: Once | ORAL | Status: DC
  Filled 2014-10-07: qty 1

## 2014-10-07 MED ORDER — CIPROFLOXACIN HCL 500 MG PO TABS: 500.0000 mg | ORAL_TABLET | Freq: Two times a day (BID) | ORAL | Status: DC

## 2014-10-07 NOTE — ED Provider Notes (Signed)
CSN: [id number]     Arrival date & time 10/07/14  0145 History   None    No chief complaint on file.    (Consider location/radiation/quality/duration/timing/severity/associated sxs/prior Treatment) Patient is a 23 y.o. female presenting with dysuria. The history is provided by the patient.  Dysuria Pain quality:  Burning Pain severity:  Severe Onset quality:  Gradual Duration:  1 week Timing:  Constant Progression:  Unchanged Chronicity:  Recurrent Recent urinary tract infections: yes   Relieved by:  Nothing Worsened by:  Nothing tried Ineffective treatments:  Cranberry juice (bactrim) Urinary symptoms: frequent urination and hesitancy   Urinary symptoms: no hematuria   Associated symptoms: no fever, no flank pain, no vaginal discharge and no vomiting   Associated symptoms comment:  Low back pain Patient returns to the ED stating that her symptoms have not gone away since being given the RX for antibiotics approximately 3 hours ago.  Moreover she states she cannot afford her medication.     Past Medical History  Diagnosis Date  . Bilateral posterior subcapsular polar cataract   . Anxiety   . IBS (irritable bowel syndrome)     2008  . OCD (obsessive compulsive disorder)   . PTSD (post-traumatic stress disorder)   . Borderline personality disorder   . Chlamydia    Past Surgical History  Procedure Laterality Date  . Foot fracture surgery     History reviewed. No pertinent family history. History  Substance Use Topics  . Smoking status: Current Every Day Smoker -- 0.50 packs/day for 3 years    Types: Cigarettes  . Smokeless tobacco: Never Used  . Alcohol Use: No   OB History    No data available     Review of Systems  Constitutional: Negative for fever.  Gastrointestinal: Negative for vomiting.  Genitourinary: Positive for dysuria and frequency. Negative for hematuria, flank pain and vaginal discharge.  All other systems reviewed and are  negative.     Allergies  Latex; Morphine and related; Mucinex; Wellbutrin; and Celexa  Home Medications   Prior to Admission medications   Medication Sig Start Date End Date Taking? Authorizing Provider  Cranberry-Vitamin C-Probiotic (AZO CRANBERRY) 250-30 MG TABS Take 2 tablets by mouth every 6 (six) hours as needed (urinary pain).    Historical Provider, MD  gabapentin (NEURONTIN) 300 MG capsule Take 3 capsules (900 mg total) by mouth at bedtime. 10/03/13   Elmarie Shiley, NP  ibuprofen (ADVIL,MOTRIN) 600 MG tablet Take 1 tablet (600 mg total) by mouth every 6 (six) hours as needed. 11/25/13   Sherrie George, PA-C  levonorgestrel-ethinyl estradiol (LUTERA) 0.1-20 MG-MCG tablet Take 1 tablet by mouth daily. 10/03/13   Elmarie Shiley, NP  naproxen (NAPROSYN) 375 MG tablet Take 1 tablet (375 mg total) by mouth 2 (two) times daily. 10/06/14   Chetara Kropp K Jaana Brodt-Rasch, MD  nitrofurantoin, macrocrystal-monohydrate, (MACROBID) 100 MG capsule Take 1 capsule (100 mg total) by mouth 2 (two) times daily. X 7 days 10/06/14   Amai Cappiello K Harles Evetts-Rasch, MD  PARoxetine (PAXIL) 40 MG tablet Take 1 tablet (40 mg total) by mouth at bedtime. 10/03/13   Elmarie Shiley, NP  phenazopyridine (PYRIDIUM) 200 MG tablet Take 1 tablet (200 mg total) by mouth 3 (three) times daily. 10/06/14   Icelyn Navarrete K Mirha Brucato-Rasch, MD  sulfamethoxazole-trimethoprim (SEPTRA DS) 800-160 MG per tablet Take 1 tablet by mouth 2 (two) times daily. 10/04/14   Ernestina Patches, MD  topiramate (TOPAMAX) 25 MG capsule Take 25 mg by mouth 2 (two) times  daily.    Historical Provider, MD   LMP 08/26/2014 Physical Exam  Constitutional: She appears well-developed and well-nourished. No distress.  HENT:  Head: Normocephalic and atraumatic.  Mouth/Throat: Oropharynx is clear and moist.  Eyes: Conjunctivae and EOM are normal.  Neck: Normal range of motion. Neck supple.  Cardiovascular: Normal rate, regular rhythm and intact distal pulses.   Pulmonary/Chest: Effort  normal and breath sounds normal.  Abdominal: Soft. She exhibits no distension. Bowel sounds are increased. There is no tenderness. There is no rigidity, no rebound, no guarding, no tenderness at McBurney's point and negative Murphy's sign.  Musculoskeletal: Normal range of motion.  Neurological: She is alert.  Skin: Skin is warm and dry.  Psychiatric: Her affect is angry. She is aggressive.    ED Course  Procedures (including critical care time) Labs Review Labs Reviewed - No data to display  Imaging Review No results found.   EKG Interpretation None      MDM   Final diagnoses:  None  Patient states she returned as she cannot afford her medication.  EDP asked patient what antibiotic she would like and has worked for her in the past.  Patient chose cipro.    EDP stated she will also provide an RX for diflucan.    Patient seen and examined with charge nurse Lyndel Safe and discharged with nurse Jinny Blossom.   With Jinny Blossom present at discharge EDP informed patient she must use condoms for one full month following taking these antibiotics.  Patient and boyfriend both verbalized understanding of the need to use condoms for all sexual encounters for the next month.  Patient must follow up with her PMD within 48 hours for recheck.       Tu Bayle Alfonso Patten, MD 10/07/14 [phone]

## 2014-10-07 NOTE — ED Provider Notes (Signed)
CSN: [id number]     Arrival date & time 10/07/14  0403 History   First MD Initiated Contact with Patient 10/07/14 0422     Chief Complaint  Patient presents with  . Flank Pain  . Pruritis     (Consider location/radiation/quality/duration/timing/severity/associated sxs/prior Treatment) HPI Comments: Patient is a 23 year old female with history of bipolar, anxiety disorder, borderline personality disorder, and panic disorder. She presents today with complaints of difficulty breathing and "cramping all over". This started shortly after she was given medication admitted [facility] [place] for an apparent urinary tract infection. She reports that her face is burning. She was seen admitted center [place] twice and both times told nothing emergent was occurring. She presents here apparently not satisfied with what she was told.  The history is provided by the patient.    Past Medical History  Diagnosis Date  . Bilateral posterior subcapsular polar cataract   . Anxiety   . IBS (irritable bowel syndrome)     2008  . OCD (obsessive compulsive disorder)   . PTSD (post-traumatic stress disorder)   . Borderline personality disorder   . Chlamydia    Past Surgical History  Procedure Laterality Date  . Foot fracture surgery     No family history on file. History  Substance Use Topics  . Smoking status: Current Every Day Smoker -- 0.50 packs/day for 3 years    Types: Cigarettes  . Smokeless tobacco: Never Used  . Alcohol Use: No   OB History    No data available     Review of Systems  All other systems reviewed and are negative.     Allergies  Latex; Morphine and related; Mucinex; Wellbutrin; and Celexa  Home Medications   Prior to Admission medications   Medication Sig Start Date End Date Taking? Authorizing Provider  ciprofloxacin (CIPRO) 500 MG tablet Take 1 tablet (500 mg total) by mouth 2 (two) times daily. One po bid x 7 days 10/07/14   April K Palumbo-Rasch, MD   Cranberry-Vitamin C-Probiotic (AZO CRANBERRY) 250-30 MG TABS Take 2 tablets by mouth every 6 (six) hours as needed (urinary pain).    Historical Provider, MD  fluconazole (DIFLUCAN) 150 MG tablet Take 1 tablet (150 mg total) by mouth once. 10/07/14   April K Palumbo-Rasch, MD  gabapentin (NEURONTIN) 300 MG capsule Take 3 capsules (900 mg total) by mouth at bedtime. 10/03/13   Elmarie Shiley, NP  ibuprofen (ADVIL,MOTRIN) 600 MG tablet Take 1 tablet (600 mg total) by mouth every 6 (six) hours as needed. 11/25/13   Sherrie George, PA-C  levonorgestrel-ethinyl estradiol (LUTERA) 0.1-20 MG-MCG tablet Take 1 tablet by mouth daily. 10/03/13   Elmarie Shiley, NP  naproxen (NAPROSYN) 375 MG tablet Take 1 tablet (375 mg total) by mouth 2 (two) times daily. 10/06/14   April K Palumbo-Rasch, MD  nitrofurantoin, macrocrystal-monohydrate, (MACROBID) 100 MG capsule Take 1 capsule (100 mg total) by mouth 2 (two) times daily. X 7 days 10/06/14   April K Palumbo-Rasch, MD  PARoxetine (PAXIL) 40 MG tablet Take 1 tablet (40 mg total) by mouth at bedtime. 10/03/13   Elmarie Shiley, NP  phenazopyridine (PYRIDIUM) 200 MG tablet Take 1 tablet (200 mg total) by mouth 3 (three) times daily. 10/06/14   April K Palumbo-Rasch, MD  sulfamethoxazole-trimethoprim (SEPTRA DS) 800-160 MG per tablet Take 1 tablet by mouth 2 (two) times daily. 10/04/14   Ernestina Patches, MD  topiramate (TOPAMAX) 25 MG capsule Take 25 mg by mouth 2 (two) times  daily.    Historical Provider, MD   BP 139/64 mmHg  Pulse 94  Temp(Src) 99.2 F (37.3 C) (Oral)  Resp 18  SpO2 99%  LMP 09/27/2014 Physical Exam  Constitutional: She is oriented to person, place, and time. She appears well-developed and well-nourished. No distress.  Patient is hyperventilating and trembling.  HENT:  Head: Normocephalic and atraumatic.  Mouth/Throat: Oropharynx is clear and moist.  Neck: Normal range of motion. Neck supple.  Cardiovascular: Normal rate, regular rhythm and normal  heart sounds.   No murmur heard. Pulmonary/Chest: Effort normal and breath sounds normal. No respiratory distress. She has no wheezes.  Breath sounds are clear and equal. There is no stridor.  Abdominal: Soft. Bowel sounds are normal. She exhibits no distension. There is no tenderness.  Musculoskeletal: Normal range of motion. She exhibits no edema.  Lymphadenopathy:    She has no cervical adenopathy.  Neurological: She is alert and oriented to person, place, and time.  Skin: Skin is warm and dry. She is not diaphoretic.  There is no rash.  Nursing note and vitals reviewed.   ED Course  Procedures (including critical care time) Labs Review Labs Reviewed - No data to display  Imaging Review No results found.   EKG Interpretation None      MDM   Final diagnoses:  None    Patient presents here complaining of a reaction to the medication she was given at bedside or [place]. She states that she is short of breath however is having no difficulty using multiple profane words. Her lungs are clear and throat is clear without stridor.  She was observed for a period of approximately 1 hour and there was no stridor, hypoxia, abnormal vital signs, or other concerning pathology. She requested a dose of Benadryl which I informed her I would order. When this was brought to her, she declined stating that she already took 2 of these before she left home. After approximately one hour, she eloped from her exam room and left the emergency department.  Veryl Speak, MD 10/07/14 [phone]

## 2014-10-07 NOTE — ED Notes (Signed)
MD at bedside. 

## 2014-10-07 NOTE — ED Notes (Signed)
Pt reports she was recently seen at [facility] and treated for a kidney infection, pt dx with a UTI last week. Pt reports they gave her a medication at [facility] and she thinks she may be having an allergic reaction to the medicine they gave her as she is sensitive to many medications. Pt reports itching to the right side of her face and states she feels like she is having trouble breathing.

## 2014-10-07 NOTE — ED Notes (Signed)
Pt left AMA. Pt not in room upon assessment.

## 2014-10-07 NOTE — ED Notes (Signed)
Pt c/o rt flank pain, states seen here earlier but unable to fill rx

## 2014-10-08 LAB — URINE CULTURE: Colony Count: 5000

## 2014-10-25 ENCOUNTER — Emergency Department (HOSPITAL_COMMUNITY)
Admission: EM | Admit: 2014-10-25 | Discharge: 2014-10-25 | Discharge status: Against Medical Advice | Payer: Self-pay | Attending: Emergency Medicine | Admitting: Emergency Medicine

## 2014-10-25 ENCOUNTER — Encounter (HOSPITAL_BASED_OUTPATIENT_CLINIC_OR_DEPARTMENT_OTHER): Payer: Self-pay | Admitting: *Deleted

## 2014-10-25 ENCOUNTER — Encounter (HOSPITAL_COMMUNITY): Payer: Self-pay | Admitting: Emergency Medicine

## 2014-10-25 ENCOUNTER — Emergency Department (HOSPITAL_BASED_OUTPATIENT_CLINIC_OR_DEPARTMENT_OTHER)
Admission: EM | Admit: 2014-10-25 | Discharge: 2014-10-25 | Discharge status: Against Medical Advice | Payer: Self-pay | Attending: Emergency Medicine | Admitting: Emergency Medicine

## 2014-10-25 DIAGNOSIS — N898 Other specified noninflammatory disorders of vagina: Secondary | ICD-10-CM | POA: Insufficient documentation

## 2014-10-25 DIAGNOSIS — Z8619 Personal history of other infectious and parasitic diseases: Secondary | ICD-10-CM | POA: Insufficient documentation

## 2014-10-25 DIAGNOSIS — Z79899 Other long term (current) drug therapy: Secondary | ICD-10-CM | POA: Insufficient documentation

## 2014-10-25 DIAGNOSIS — Z91040 Latex allergy status: Secondary | ICD-10-CM | POA: Insufficient documentation

## 2014-10-25 DIAGNOSIS — Z720 Tobacco use: Secondary | ICD-10-CM | POA: Insufficient documentation

## 2014-10-25 DIAGNOSIS — F606 Avoidant personality disorder: Secondary | ICD-10-CM | POA: Insufficient documentation

## 2014-10-25 DIAGNOSIS — E109 Type 1 diabetes mellitus without complications: Secondary | ICD-10-CM | POA: Insufficient documentation

## 2014-10-25 DIAGNOSIS — F419 Anxiety disorder, unspecified: Secondary | ICD-10-CM | POA: Insufficient documentation

## 2014-10-25 DIAGNOSIS — Z3202 Encounter for pregnancy test, result negative: Secondary | ICD-10-CM | POA: Insufficient documentation

## 2014-10-25 DIAGNOSIS — Z202 Contact with and (suspected) exposure to infections with a predominantly sexual mode of transmission: Secondary | ICD-10-CM | POA: Insufficient documentation

## 2014-10-25 LAB — URINALYSIS, ROUTINE W REFLEX MICROSCOPIC
Bilirubin Urine: NEGATIVE
GLUCOSE, UA: NEGATIVE mg/dL
Hgb urine dipstick: NEGATIVE
KETONES UR: NEGATIVE mg/dL
LEUKOCYTES UA: NEGATIVE
Nitrite: NEGATIVE
Protein, ur: NEGATIVE mg/dL
Specific Gravity, Urine: 1.012 (ref 1.005–1.030)
Urobilinogen, UA: 0.2 mg/dL (ref 0.0–1.0)
pH: 8 (ref 5.0–8.0)

## 2014-10-25 LAB — PREGNANCY, URINE: PREG TEST UR: NEGATIVE
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
## 2014-10-25 NOTE — ED Notes (Signed)
Pt left department stating she didn't want to wait any longer; pt had removed her IV. Pt refused to sign AMA. Connye Burkitt, RN

## 2014-10-25 NOTE — ED Notes (Signed)
Called pt twice to room, no answer; pt not in the waiting room or in front of the entrance
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
## 2014-10-25 NOTE — ED Notes (Signed)
I went to lobby and call patient name twice no answer

## 2014-10-25 NOTE — ED Notes (Signed)
Pelvic cart is at the bedside set up and ready for the doctor to use.

## 2014-10-26 LAB — RPR

## 2014-10-26 LAB — HIV ANTIBODY (ROUTINE TESTING W REFLEX): HIV 1&2 Ab, 4th Generation: NONREACTIVE

## 2014-12-02 ENCOUNTER — Encounter (HOSPITAL_COMMUNITY): Payer: Self-pay | Admitting: Emergency Medicine

## 2014-12-02 ENCOUNTER — Emergency Department (HOSPITAL_COMMUNITY)
Admission: EM | Admit: 2014-12-02 | Discharge: 2014-12-02 | Discharge status: Against Medical Advice | Payer: Self-pay | Attending: Emergency Medicine | Admitting: Emergency Medicine

## 2014-12-02 DIAGNOSIS — S8001XA Contusion of right knee, initial encounter: Secondary | ICD-10-CM | POA: Insufficient documentation

## 2014-12-02 DIAGNOSIS — Z720 Tobacco use: Secondary | ICD-10-CM | POA: Insufficient documentation

## 2014-12-02 DIAGNOSIS — Y998 Other external cause status: Secondary | ICD-10-CM | POA: Insufficient documentation

## 2014-12-02 DIAGNOSIS — Y9389 Activity, other specified: Secondary | ICD-10-CM | POA: Insufficient documentation

## 2014-12-02 DIAGNOSIS — F419 Anxiety disorder, unspecified: Secondary | ICD-10-CM | POA: Insufficient documentation

## 2014-12-02 DIAGNOSIS — Y9289 Other specified places as the place of occurrence of the external cause: Secondary | ICD-10-CM | POA: Insufficient documentation
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
## 2014-12-02 NOTE — ED Notes (Signed)
Pt not in room.

## 2014-12-02 NOTE — ED Notes (Signed)
Pt currently not in room. Assessment by PA delayed
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
## 2015-10-23 ENCOUNTER — Emergency Department (HOSPITAL_COMMUNITY)
Admission: EM | Admit: 2015-10-23 | Discharge: 2015-10-23 | Disposition: A | Discharge status: Home / Self Care | Payer: Self-pay | Attending: Emergency Medicine | Admitting: Emergency Medicine

## 2015-10-23 ENCOUNTER — Encounter (HOSPITAL_COMMUNITY): Payer: Self-pay | Admitting: *Deleted

## 2015-10-23 DIAGNOSIS — Z91040 Latex allergy status: Secondary | ICD-10-CM | POA: Insufficient documentation

## 2015-10-23 DIAGNOSIS — F17210 Nicotine dependence, cigarettes, uncomplicated: Secondary | ICD-10-CM | POA: Insufficient documentation

## 2015-10-23 DIAGNOSIS — H269 Unspecified cataract: Secondary | ICD-10-CM | POA: Insufficient documentation

## 2015-10-23 DIAGNOSIS — F429 Obsessive-compulsive disorder, unspecified: Secondary | ICD-10-CM | POA: Insufficient documentation

## 2015-10-23 DIAGNOSIS — Z8719 Personal history of other diseases of the digestive system: Secondary | ICD-10-CM | POA: Insufficient documentation

## 2015-10-23 DIAGNOSIS — R110 Nausea: Secondary | ICD-10-CM | POA: Insufficient documentation

## 2015-10-23 DIAGNOSIS — Z79899 Other long term (current) drug therapy: Secondary | ICD-10-CM | POA: Insufficient documentation

## 2015-10-23 DIAGNOSIS — A599 Trichomoniasis, unspecified: Secondary | ICD-10-CM

## 2015-10-23 DIAGNOSIS — A5901 Trichomonal vulvovaginitis: Secondary | ICD-10-CM | POA: Insufficient documentation

## 2015-10-23 DIAGNOSIS — Z3202 Encounter for pregnancy test, result negative: Secondary | ICD-10-CM | POA: Insufficient documentation

## 2015-10-23 DIAGNOSIS — F419 Anxiety disorder, unspecified: Secondary | ICD-10-CM | POA: Insufficient documentation

## 2015-10-23 LAB — URINE MICROSCOPIC-ADD ON

## 2015-10-23 LAB — URINALYSIS, ROUTINE W REFLEX MICROSCOPIC
Bilirubin Urine: NEGATIVE
Glucose, UA: NEGATIVE mg/dL
Ketones, ur: NEGATIVE mg/dL
NITRITE: NEGATIVE
Protein, ur: NEGATIVE mg/dL
Specific Gravity, Urine: 1.02 (ref 1.005–1.030)
pH: 5.5 (ref 5.0–8.0)

## 2015-10-23 LAB — POC URINE PREG, ED
Preg Test, Ur: NEGATIVE
Preg Test, Ur: NEGATIVE

## 2015-10-23 MED ORDER — DOXYCYCLINE HYCLATE 100 MG PO CAPS: 100.0000 mg | ORAL_CAPSULE | Freq: Two times a day (BID) | ORAL | Status: DC

## 2015-10-23 MED ORDER — METRONIDAZOLE 500 MG PO TABS
500.0000 mg | ORAL_TABLET | Freq: Once | ORAL | Status: AC
  Administered 2015-10-23: 500 mg via ORAL
  Filled 2015-10-23: qty 1

## 2015-10-23 MED ORDER — AZITHROMYCIN 1 G PO PACK
1.0000 g | PACK | Freq: Once | ORAL | Status: AC
  Administered 2015-10-23: 1 g via ORAL
  Filled 2015-10-23: qty 1

## 2015-10-23 MED ORDER — CEFTRIAXONE SODIUM 250 MG IJ SOLR
250.0000 mg | Freq: Once | INTRAMUSCULAR | Status: AC
  Administered 2015-10-23: 250 mg via INTRAMUSCULAR
  Filled 2015-10-23: qty 250

## 2015-10-23 MED ORDER — METRONIDAZOLE 500 MG PO TABS: 500.0000 mg | ORAL_TABLET | Freq: Three times a day (TID) | ORAL | Status: DC

## 2015-10-23 MED ORDER — DOXYCYCLINE HYCLATE 100 MG PO TABS
100.0000 mg | ORAL_TABLET | Freq: Once | ORAL | Status: AC
  Administered 2015-10-23: 100 mg via ORAL
  Filled 2015-10-23: qty 1

## 2015-10-23 NOTE — ED Notes (Signed)
Pt is here with yellow vaginal discharge and pelvic pain and thinks she may have STD.  LMP Last week of October.

## 2015-10-23 NOTE — ED Notes (Signed)
The pt has had a vaginal discharge for one week the discharge is yellow in color  She has also had itching and burning  She thinks she has a std  lmp last montyh

## 2015-10-23 NOTE — ED Provider Notes (Signed)
CSN: [id number]     Arrival date & time 10/23/15  1312 History   First MD Initiated Contact with Patient 10/23/15 1603     Chief Complaint  Patient presents with  . Vaginal Discharge     (Consider location/radiation/quality/duration/timing/severity/associated sxs/prior Treatment) HPI  24 year old female history, who presents emergency department today for the proximal with 3 days progressively worsening vaginal discharge and slight yellow in color. She also has some suprapubic pain and back pain similar to previous Chlamydia infections. She states she is monogamous however does not think her boyfriend is. No fevers no vomiting but has had some nausea. No urinary symptoms. Does not always use protection.  Past Medical History  Diagnosis Date  . Bilateral posterior subcapsular polar cataract   . Anxiety   . IBS (irritable bowel syndrome)     2008  . OCD (obsessive compulsive disorder)   . PTSD (post-traumatic stress disorder)   . Borderline personality disorder   . Chlamydia    Past Surgical History  Procedure Laterality Date  . Foot fracture surgery     No family history on file. Social History  Substance Use Topics  . Smoking status: Current Every Day Smoker -- 0.50 packs/day for 3 years    Types: Cigarettes  . Smokeless tobacco: Never Used  . Alcohol Use: No   OB History    No data available     Review of Systems  Constitutional: Negative for fever and fatigue.  Respiratory: Negative for cough and shortness of breath.   Cardiovascular: Negative for chest pain and leg swelling.  Gastrointestinal: Positive for nausea. Negative for vomiting, abdominal pain, diarrhea and constipation.  All other systems reviewed and are negative.     Allergies  Mucinex; Latex; Macrobid; Morphine and related; Wellbutrin; and Celexa  Home Medications   Prior to Admission medications   Medication Sig Start Date End Date Taking? Authorizing Provider  FLUoxetine (PROZAC) 40 MG  capsule Take 40 mg by mouth daily.   Yes Historical Provider, MD  gabapentin (NEURONTIN) 300 MG capsule Take 3 capsules (900 mg total) by mouth at bedtime. 10/03/13  Yes Niel Hummer, NP  ibuprofen (ADVIL,MOTRIN) 600 MG tablet Take 1 tablet (600 mg total) by mouth every 6 (six) hours as needed. Patient taking differently: Take 600 mg by mouth every 6 (six) hours as needed for moderate pain.  11/25/13  Yes Maude Leriche, PA-C  levonorgestrel-ethinyl estradiol (LUTERA) 0.1-20 MG-MCG tablet Take 1 tablet by mouth daily. 10/03/13  Yes Niel Hummer, NP  doxycycline (VIBRAMYCIN) 100 MG capsule Take 1 capsule (100 mg total) by mouth 2 (two) times daily. One po bid x 14 days 10/23/15   Merrily Pew, MD  metroNIDAZOLE (FLAGYL) 500 MG tablet Take 1 tablet (500 mg total) by mouth 3 (three) times daily. 10/23/15   Merrily Pew, MD   BP 111/71 mmHg  Pulse 82  Temp(Src) 98.6 F (37 C) (Oral)  Resp 18  SpO2 100% Physical Exam  Constitutional: She appears well-developed and well-nourished.  HENT:  Head: Normocephalic and atraumatic.  Neck: Normal range of motion.  Cardiovascular: Normal rate and regular rhythm.   Pulmonary/Chest: Effort normal. No stridor. No respiratory distress.  Abdominal: Soft. She exhibits no distension. There is no tenderness. There is no rebound and no guarding.  Genitourinary: There is no rash, tenderness or lesion on the right labia. There is no rash, tenderness or lesion on the left labia. Cervix exhibits discharge. Cervix exhibits no motion tenderness and no friability. Right  adnexum displays no mass and no tenderness. Left adnexum displays no mass and no tenderness.  Neurological: She is alert.  Skin: Skin is warm and dry.  Nursing note and vitals reviewed.   ED Course  Procedures (including critical care time) Labs Review Labs Reviewed  URINALYSIS, ROUTINE W REFLEX MICROSCOPIC (NOT AT [facility]) - Abnormal; Notable for the following:    APPearance TURBID (*)    Hgb urine  dipstick MODERATE (*)    Leukocytes, UA LARGE (*)    All other components within normal limits  URINE MICROSCOPIC-ADD ON - Abnormal; Notable for the following:    Squamous Epithelial / LPF 6-30 (*)    Bacteria, UA MANY (*)    All other components within normal limits  RPR  HIV ANTIBODY (ROUTINE TESTING)  POC URINE PREG, ED  POC URINE PREG, ED  GC/CHLAMYDIA PROBE AMP ([place]) NOT AT [facility]    Imaging Review No results found. I have personally reviewed and evaluated these images and lab results as part of my medical decision-making.   EKG Interpretation None      MDM   Final diagnoses:  Trichomonas infection   STD. Suspect PID, will tx and eval appropriately based on pelvic exam.   Pelvic with some thin white discharge with yellow tint. Samples sent. No adnexal or cervical symptoms. Will ppx tx for std and will need doxy for possible PID.   Wet prep spilled in lab. Requested recollect however trich seen on urine so will tx w/ flagyl anyway. Doubt yeast based on exam, so no need to recollect.       Merrily Pew, MD 10/24/15 [phone]

## 2015-10-23 NOTE — Discharge Instructions (Signed)
°Emergency Department Resource Guide °1) Find a Doctor and Pay Out of Pocket °Although you won't have to find out who is covered by your insurance plan, it is a good idea to ask around and get recommendations. You will then need to call the office and see if the doctor you have chosen will accept you as a new patient and what types of options they offer for patients who are self-pay. Some doctors offer discounts or will set up payment plans for their patients who do not have insurance, but you will need to ask so you aren't surprised when you get to your appointment. ° °2) Contact Your Local Health Department °Not all health departments have doctors that can see patients for sick visits, but many do, so it is worth a call to see if yours does. If you don't know where your local health department is, you can check in your phone book. The CDC also has a tool to help you locate your state's health department, and many state websites also have listings of all of their local health departments. ° °3) Find a Walk-in Clinic °If your illness is not likely to be very severe or complicated, you may want to try a walk in clinic. These are popping up all over the country in pharmacies, drugstores, and shopping centers. They're usually staffed by nurse practitioners or physician assistants that have been trained to treat common illnesses and complaints. They're usually fairly quick and inexpensive. However, if you have serious medical issues or chronic medical problems, these are probably not your best option. ° °No Primary Care Doctor: °- Call [facility] at  832-8000 - they can help you locate a primary care doctor that  accepts your insurance, provides certain services, etc. °- Physician Referral Service- 1-800-533-3463 ° °Chronic Pain Problems: °Organization         Address  Phone   Notes  °[facility]  (336) 297-2271 Patients need to be referred by their primary care doctor.  ° °Medication  Assistance: °Organization         Address  Phone   Notes  °[place] [facility] [address] °[place], [place] [address] (336) 641-8030 --Must be a resident of [place] °-- Must have NO insurance coverage whatsoever (no Medicaid/ Medicare, etc.) °-- The pt. MUST have a primary care doctor that directs their care regularly and follows them in the community °  °[facility]  (866) 331-1348   °[employer]  (888) 892-1162   ° °Agencies that provide inexpensive medical care: °Organization         Address  Phone   Notes  °[facility]  (336) 832-8035   °[facility]    (336) 832-7272   °[facility] [address] °[place], [place] [address] (336) 832-4777   °[facility] [address], °[place] (336) 271-4999   °[facility]    (336) 373-0678   °[facility]    (336) 272-1050   °[facility] ° [address], [place] Phone:  (336) 832-4444, Fax:  (336) 832-4440 Hours of Operation:  9 am - 6 pm, M-F.  Also accepts Medicaid/Medicare and self-pay.  °[facility] ° [address], [place] Phone: (336) 832-3150, Fax: (336) [phone]. Hours of Operation:  8:30 am - 5:30 pm, M-F.  Also accepts Medicaid and self-pay.  °[facility] [place] [address], [place] Phone: (336) 878-6027   °[facility] [address], [place], [place] (336)723-1848, Ext. 123 Mondays & Thursdays: 7-9 AM.  First 15 patients are seen on a first come, first serve basis. °  ° °Medicaid-accepting [place] Providers: ° °Organization         Address  Phone   Notes  °[facility] [address], [place] (336) 641-2100 Also accepts self-pay patients.  °[facility] [address], [place] ° (336) 856-9996   °[facility] [address], [place]  (336) 288-8857   °[facility] [address] [place] [address], [place] (336) 299-7000   °Veita Bland [address], [place]  ° (336) 373-1557 Only accepts  Access Medicaid patients after they have their name applied to their card.  ° °Self-Pay (no insurance) in [place]: ° °Organization         Address  Phone   Notes  °Sickle Cell Patients, [facility] [address], [place] (336) 832-1970   °[facility] [address], [place] (336) 832-4400   °[facility] ° [address], [place] (336) 992-4800   °[facility]/Dr. Osei-Bonsu ° [address] [place] [address], [place] or [address], [place] (336) 841-8500 Phone number for both [place] and [place] locations is the same.  °[facility] [address], [place] (336) 299-0000   °[facility] [address] [place] [address], [place] or [address] (336) 852-7530 °(336) 878-2260   °[facility] [address], [place] (336) 350-1642, phone; (336) 294-5005, fax Sees patients 1st and 3rd Saturday of every month.  Must not qualify for public or private insurance (i.e. Medicaid, Medicare, [place] Health Choice, Veterans' Benefits) • Household income should be no more than 200% of the poverty level •The clinic cannot treat you if you are pregnant or think you are pregnant • Sexually transmitted diseases are not treated at the clinic.  ° ° °Dental Care: °Organization         Address  Phone  Notes  °[place] [facility] [address], [place] (336) 641-6152 Accepts children up to age 21 who are enrolled in Medicaid or [place] Health Choice; pregnant women with a Medicaid card; and children who have applied for Medicaid or [place] Health Choice, but were declined, whose parents can pay a reduced fee at time of service.  °[place]  [facility] [place]  [address], [place] (336) 641-7733 Accepts children up to age 21 who are enrolled in Medicaid or [place] Health Choice; pregnant women with a Medicaid card; and children who have applied for Medicaid or  Health Choice, but were declined, whose parents can pay a reduced fee at time of service.  °[facility] ° [address], [place] (336) 641-4533 Patients are seen by appointment only. Walk-ins are not accepted. [facility] will see patients 18 years of age and older. °Monday - Tuesday (8am-5pm) °Most Wednesdays (8:30-5pm) °$30 per visit, cash only  °[facility] ° [address], [place] (336) 641-4533 Patients are seen by appointment only. Walk-ins are not accepted. [facility] will see patients 18 years of age and older. °One   Wednesday Evening (Monthly: Volunteer Based).  $30 per visit, cash only  °[facility]  (919) 537-3737 for adults; Children under age 4, call Graduate Pediatric Dentistry at (919) [phone]. Children aged 4-14, please call (919) [phone] to request a pediatric application. ° Dental services are provided in all areas of dental care including fillings, crowns and bridges, complete and partial dentures, implants, gum treatment, root canals, and extractions. Preventive care is also provided. Treatment is provided to both adults and children. °Patients are selected via a lottery and there is often a waiting list. °  °[facility] [address], °[place] ° (336) 763-8833 [email or web address] °  °[facility] [address], [place], [place] (336)723-1848, Ext. 123 Second and Fourth Thursday of each month, opens at 6:30 AM; Clinic ends at 9 AM.  Patients are seen on a first-come first-served basis, and a limited number are seen during each clinic.  ° °[facility] ° [address], [place], [place] (336) 723-7904    Eligibility Requirements °You must have lived in [place], [place], or [place] counties for at least the last three months. °  You cannot be eligible for state or federal sponsored healthcare insurance, including Veterans Administration, Medicaid, or Medicare. °  You generally cannot be eligible for healthcare insurance through your employer.  °  How to apply: °Eligibility screenings are held every Tuesday and Wednesday afternoon from 1:00 pm until 4:00 pm. You do not need an appointment for the interview!  °[facility] [address], [place], [place] 336-631-2330   °[facility]  336-342-8273   °[facility]  336-703-3100   °[facility]  336-570-6415   ° °Behavioral Health Resources in the Community: °Intensive Outpatient Programs °Organization         Address  Phone  Notes  °[facility] [address], [place], [place] 336-878-6098   °[facility] [address], [place], [place] 336-832-9800   °[facility] [address], [place], [place] ° 336-882-2125   °[facility] [address],  °[place], [place] 1-800-853-5163 or 336-641-4981   °Substance Abuse Resources °Organization         Address  Phone  Notes  °[facility]  336-882-2125   °[facility]  336-784-9470   °[facility]  336-285-9073   °[facility]  336-845-3988   °Residential & Outpatient Substance Abuse Program  1-800-659-3381   °Psychological Services °Organization         Address  Phone  Notes  °[facility]  336- 832-9600   °[facility]  336- 378-7881   °[facility] [address], [place] 1-800-853-5163 or 336-641-4981   ° °Mobile Crisis Teams °Organization         Address  Phone  Notes  °[facility], Mobile Crisis Care Unit  1-877-626-1772   °Assertive °[facility] ° [address]  [place], [place] 336-834-9664   °Sharon DeEsch [address] °[place] 336-554-5454   ° °Self-Help/Support Groups °Organization         Address  Phone             Notes  °[facility] - variety of support groups  336- 373-1402 Call for more information  °Narcotics Anonymous (NA), [facility] [address], °[place]   2 meetings at this location  ° °  Residential Treatment Programs °Organization         Address  Phone  Notes  °[facility] [address],    °[place]  1-866-801-8205   °[facility] ° [address], [place], [place] 704-293-8524   °[facility] [address], [place] 336-845-3988 Admissions: 8am-3pm M-F  °[facility] [address]    °[place], [place] 336-841-1104   °[facility] [address], [place], [place] 336-379-7146   °[facility] [address]  °[place], [place] 336-285-9073   °[facility] Intensive Outpatient [address], [place], [place] 336-852-3033   °[facility] [address]  °[place], [place] 1-877-615-2722 or 336-784-9470   °[facility] [address] [place], [place] 336-227-7417 Accepts Medicaid  °[facility] [address]  °[place] 1-800-659-3381 Substance Abuse/Addiction Treatment  ° °[place] Behavioral Health Resources °Organization         Address  Phone  Notes  °[facility]  (888) 581-9988   °Julie Brannon, PhD [address] [place], [place]   (336) 349-5553 or (336) 951-0000   °[facility]   [address] °[place], [place] (336) 349-4454   °[facility] [address], [place], [place] (336) 342-8316 Insurance/Medicaid/sponsorship through [facility]  °[facility] [address]                                    [place], [place] (336) 342-8316 Therapy/tele-psych/case    °[facility] [address]  ° [place], [place] (336) 349-2233    °Dr. Arfeen  (336) 349-4544   °[facility] [place]  [employer] [place] [facility] 1) [address], [place] °2) [address], [place] °3)  [address], [place] (336) 349-3220 °(336) 342-7768 ° °(336) 342-8140   °[place] [facility] (336) 342-1394 or (336) 342-3537 (After Hours)    ° ° °

## 2015-10-24 LAB — RPR: RPR Ser Ql: NONREACTIVE

## 2015-10-24 LAB — HIV ANTIBODY (ROUTINE TESTING W REFLEX): HIV Screen 4th Generation wRfx: NONREACTIVE

## 2015-10-26 LAB — GC/CHLAMYDIA PROBE AMP (~~LOC~~) NOT AT ARMC
Chlamydia: NEGATIVE
NEISSERIA GONORRHEA: NEGATIVE

## 2016-04-13 ENCOUNTER — Encounter: Payer: Self-pay | Admitting: Internal Medicine

## 2016-04-13 ENCOUNTER — Ambulatory Visit (INDEPENDENT_AMBULATORY_CARE_PROVIDER_SITE_OTHER): Payer: Self-pay | Admitting: Internal Medicine

## 2016-04-13 VITALS — BP 128/66 | HR 74 | Resp 22 | Ht 65.0 in | Wt 161.0 lb

## 2016-04-13 DIAGNOSIS — Z3041 Encounter for surveillance of contraceptive pills: Secondary | ICD-10-CM

## 2016-04-13 DIAGNOSIS — K029 Dental caries, unspecified: Secondary | ICD-10-CM

## 2016-04-13 MED ORDER — LEVONORGESTREL-ETHINYL ESTRAD 0.1-20 MG-MCG PO TABS: 1.0000 | ORAL_TABLET | Freq: Every day | ORAL | Status: DC

## 2016-04-13 NOTE — Patient Instructions (Addendum)
We will work on getting an echocardiogram set up as well.  Paperwork for Financial assistance  1-800-QUITNOW Nicotine patch 14 mg change daily for 28 days, then 7 mg patch for 14 days, then stop. Get rid of all smoking paraphernalia on first day of patch use
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
## 2016-07-01 ENCOUNTER — Ambulatory Visit: Payer: Self-pay | Admitting: Internal Medicine

## 2016-11-25 ENCOUNTER — Emergency Department (HOSPITAL_BASED_OUTPATIENT_CLINIC_OR_DEPARTMENT_OTHER)
Admission: EM | Admit: 2016-11-25 | Discharge: 2016-11-25 | Disposition: A | Discharge status: Home / Self Care | Payer: Self-pay | Attending: Emergency Medicine | Admitting: Emergency Medicine

## 2016-11-25 ENCOUNTER — Encounter (HOSPITAL_BASED_OUTPATIENT_CLINIC_OR_DEPARTMENT_OTHER): Payer: Self-pay | Admitting: Emergency Medicine

## 2016-11-25 DIAGNOSIS — N3001 Acute cystitis with hematuria: Secondary | ICD-10-CM | POA: Insufficient documentation

## 2016-11-25 DIAGNOSIS — Z79899 Other long term (current) drug therapy: Secondary | ICD-10-CM | POA: Insufficient documentation

## 2016-11-25 DIAGNOSIS — Z91040 Latex allergy status: Secondary | ICD-10-CM | POA: Insufficient documentation

## 2016-11-25 DIAGNOSIS — F17210 Nicotine dependence, cigarettes, uncomplicated: Secondary | ICD-10-CM | POA: Insufficient documentation

## 2016-11-25 LAB — URINALYSIS, ROUTINE W REFLEX MICROSCOPIC
Bilirubin Urine: NEGATIVE
Glucose, UA: NEGATIVE mg/dL
Ketones, ur: NEGATIVE mg/dL
Nitrite: NEGATIVE
Protein, ur: 100 mg/dL — AB
Specific Gravity, Urine: 1.022 (ref 1.005–1.030)
pH: 6.5 (ref 5.0–8.0)

## 2016-11-25 LAB — URINALYSIS, MICROSCOPIC (REFLEX)

## 2016-11-25 LAB — PREGNANCY, URINE: Preg Test, Ur: NEGATIVE

## 2016-11-25 MED ORDER — CEPHALEXIN 500 MG PO CAPS: 500.0000 mg | ORAL_CAPSULE | Freq: Two times a day (BID) | ORAL | 0 refills | Status: DC

## 2016-11-25 MED FILL — CEPHALEXIN 500 MG CAPSULE: 500 | 7 days supply | Qty: 14 | Fill #0

## 2016-11-25 NOTE — ED Triage Notes (Signed)
Dysuria x 3 days 
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
## 2016-11-25 NOTE — ED Notes (Signed)
ED Provider at bedside. 

## 2016-11-25 NOTE — Discharge Instructions (Signed)
Urine shows bacteria. This is likely urinary tract infection. Please take the antibiotics as prescribed for 7 days. Follow up with your primary care doctor. Return to the ED if he develops fevers, flank pain, abdominal pain, nausea, emesis or for any reason.

## 2016-12-29 ENCOUNTER — Encounter (HOSPITAL_COMMUNITY): Payer: Self-pay | Admitting: Emergency Medicine

## 2016-12-29 ENCOUNTER — Emergency Department (HOSPITAL_COMMUNITY)
Admission: EM | Admit: 2016-12-29 | Discharge: 2016-12-29 | Disposition: A | Discharge status: Home / Self Care | Payer: Self-pay | Attending: Emergency Medicine | Admitting: Emergency Medicine

## 2016-12-29 DIAGNOSIS — Z5321 Procedure and treatment not carried out due to patient leaving prior to being seen by health care provider: Secondary | ICD-10-CM | POA: Insufficient documentation

## 2016-12-29 DIAGNOSIS — R1110 Vomiting, unspecified: Secondary | ICD-10-CM | POA: Insufficient documentation

## 2016-12-29 LAB — LIPASE, BLOOD: LIPASE: 13 U/L (ref 11–51)

## 2016-12-29 LAB — I-STAT BETA HCG BLOOD, ED (MC, WL, AP ONLY): I-stat hCG, quantitative: <5 m[IU]/mL (ref <5)

## 2016-12-29 LAB — COMPREHENSIVE METABOLIC PANEL
ALBUMIN: 5 g/dL (ref 3.5–5.0)
ALT: 12 U/L — ABNORMAL LOW (ref 14–54)
ANION GAP: 12 (ref 5–15)
AST: 19 U/L (ref 15–41)
Alkaline Phosphatase: 106 U/L (ref 38–126)
BILIRUBIN TOTAL: 0.4 mg/dL (ref 0.3–1.2)
BUN: 6 mg/dL (ref 6–20)
CHLORIDE: 101 mmol/L (ref 101–111)
CO2: 27 mmol/L (ref 22–32)
Calcium: 10 mg/dL (ref 8.9–10.3)
Creatinine, Ser: 0.94 mg/dL (ref 0.44–1.00)
GFR calc Af Amer: >60 mL/min (ref >60)
GFR calc non Af Amer: >60 mL/min (ref >60)
GLUCOSE: 110 mg/dL — AB (ref 65–99)
POTASSIUM: 4 mmol/L (ref 3.5–5.1)
Sodium: 140 mmol/L (ref 135–145)
TOTAL PROTEIN: 8.4 g/dL — AB (ref 6.5–8.1)

## 2016-12-29 LAB — CBC
HEMATOCRIT: 44.9 % (ref 36.0–46.0)
HEMOGLOBIN: 14.8 g/dL (ref 12.0–15.0)
MCH: 29.2 pg (ref 26.0–34.0)
MCHC: 33 g/dL (ref 30.0–36.0)
MCV: 88.7 fL (ref 78.0–100.0)
Platelets: 350 10*3/uL (ref 150–400)
RBC: 5.06 MIL/uL (ref 3.87–5.11)
RDW: 13.5 % (ref 11.5–15.5)
WBC: 15.2 10*3/uL — ABNORMAL HIGH (ref 4.0–10.5)

## 2016-12-29 MED ORDER — ONDANSETRON 4 MG PO TBDP
4.0000 mg | ORAL_TABLET | Freq: Once | ORAL | Status: AC | PRN
  Administered 2016-12-29: 4 mg via ORAL
  Filled 2016-12-29: qty 1

## 2016-12-29 NOTE — ED Notes (Signed)
2x call back to recheck vital signs, no answer. Patient has not been seen in the lobby since last call.

## 2016-12-29 NOTE — ED Notes (Signed)
Patient was called back for 3rd and final time for vital sign recheck no answer. Patient not seen in the lobby. RN Apolonio Schneiders made aware

## 2016-12-29 NOTE — ED Notes (Signed)
1x episode of emesis, RN Apolonio Schneiders made aware

## 2016-12-29 NOTE — ED Triage Notes (Signed)
Pt reports she drank a bottle of [employer] last night. Woke up this am with emesis, HA and dizziness. Pt alert, oriented and ambulatory in triage. Pt only drinks on occasion.

## 2016-12-29 NOTE — ED Notes (Signed)
1x call back for vital sign recheck, no answer

## 2017-02-14 ENCOUNTER — Emergency Department (HOSPITAL_BASED_OUTPATIENT_CLINIC_OR_DEPARTMENT_OTHER)
Admission: EM | Admit: 2017-02-14 | Discharge: 2017-02-14 | Disposition: A | Discharge status: Home / Self Care | Payer: Self-pay | Attending: Emergency Medicine | Admitting: Emergency Medicine

## 2017-02-14 ENCOUNTER — Encounter (HOSPITAL_BASED_OUTPATIENT_CLINIC_OR_DEPARTMENT_OTHER): Payer: Self-pay | Admitting: *Deleted

## 2017-02-14 DIAGNOSIS — R21 Rash and other nonspecific skin eruption: Secondary | ICD-10-CM

## 2017-02-14 DIAGNOSIS — Y939 Activity, unspecified: Secondary | ICD-10-CM | POA: Insufficient documentation

## 2017-02-14 DIAGNOSIS — R110 Nausea: Secondary | ICD-10-CM

## 2017-02-14 DIAGNOSIS — S30861A Insect bite (nonvenomous) of abdominal wall, initial encounter: Secondary | ICD-10-CM | POA: Insufficient documentation

## 2017-02-14 DIAGNOSIS — Z79899 Other long term (current) drug therapy: Secondary | ICD-10-CM | POA: Insufficient documentation

## 2017-02-14 DIAGNOSIS — S50861A Insect bite (nonvenomous) of right forearm, initial encounter: Secondary | ICD-10-CM | POA: Insufficient documentation

## 2017-02-14 DIAGNOSIS — Y999 Unspecified external cause status: Secondary | ICD-10-CM | POA: Insufficient documentation

## 2017-02-14 DIAGNOSIS — Y92009 Unspecified place in unspecified non-institutional (private) residence as the place of occurrence of the external cause: Secondary | ICD-10-CM | POA: Insufficient documentation

## 2017-02-14 DIAGNOSIS — W57XXXA Bitten or stung by nonvenomous insect and other nonvenomous arthropods, initial encounter: Secondary | ICD-10-CM | POA: Insufficient documentation

## 2017-02-14 DIAGNOSIS — F17210 Nicotine dependence, cigarettes, uncomplicated: Secondary | ICD-10-CM | POA: Insufficient documentation

## 2017-02-14 DIAGNOSIS — S50862A Insect bite (nonvenomous) of left forearm, initial encounter: Secondary | ICD-10-CM | POA: Insufficient documentation

## 2017-02-14 MED ORDER — DIPHENHYDRAMINE HCL 25 MG PO TABS: 25.0000 mg | ORAL_TABLET | Freq: Four times a day (QID) | ORAL | 0 refills | Status: DC | PRN

## 2017-02-14 MED ORDER — ONDANSETRON 4 MG PO TBDP: 4.0000 mg | ORAL_TABLET | Freq: Once | ORAL | Status: DC

## 2017-02-14 MED ORDER — ONDANSETRON 4 MG PO TBDP: 4.0000 mg | ORAL_TABLET | Freq: Three times a day (TID) | ORAL | 0 refills | Status: DC | PRN

## 2017-02-14 NOTE — ED Triage Notes (Signed)
Pt reports staying the night at a friend's house that is known to have bedbugs, since then she has been itching. States she has used calamine lotion with no relief.
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
## 2017-04-25 ENCOUNTER — Other Ambulatory Visit: Payer: Self-pay | Admitting: Obstetrics and Gynecology

## 2017-04-27 LAB — CYTOLOGY - PAP

## 2017-05-29 ENCOUNTER — Encounter (HOSPITAL_BASED_OUTPATIENT_CLINIC_OR_DEPARTMENT_OTHER): Payer: Self-pay | Admitting: *Deleted

## 2017-05-29 ENCOUNTER — Emergency Department (HOSPITAL_BASED_OUTPATIENT_CLINIC_OR_DEPARTMENT_OTHER)
Admission: EM | Admit: 2017-05-29 | Discharge: 2017-05-29 | Disposition: A | Discharge status: Home / Self Care | Payer: Self-pay | Attending: Emergency Medicine | Admitting: Emergency Medicine

## 2017-05-29 DIAGNOSIS — N760 Acute vaginitis: Secondary | ICD-10-CM | POA: Insufficient documentation

## 2017-05-29 DIAGNOSIS — B9689 Other specified bacterial agents as the cause of diseases classified elsewhere: Secondary | ICD-10-CM

## 2017-05-29 DIAGNOSIS — F17210 Nicotine dependence, cigarettes, uncomplicated: Secondary | ICD-10-CM | POA: Insufficient documentation

## 2017-05-29 DIAGNOSIS — Z79899 Other long term (current) drug therapy: Secondary | ICD-10-CM | POA: Insufficient documentation

## 2017-05-29 DIAGNOSIS — Z91040 Latex allergy status: Secondary | ICD-10-CM | POA: Insufficient documentation

## 2017-05-29 LAB — URINALYSIS, ROUTINE W REFLEX MICROSCOPIC
BILIRUBIN URINE: NEGATIVE
GLUCOSE, UA: NEGATIVE mg/dL
HGB URINE DIPSTICK: NEGATIVE
Ketones, ur: NEGATIVE mg/dL
Leukocytes, UA: NEGATIVE
Nitrite: NEGATIVE
PH: 7 (ref 5.0–8.0)
Protein, ur: NEGATIVE mg/dL
SPECIFIC GRAVITY, URINE: 1.004 — AB (ref 1.005–1.030)

## 2017-05-29 LAB — PREGNANCY, URINE: PREG TEST UR: NEGATIVE

## 2017-05-29 LAB — WET PREP, GENITAL
SPERM: NONE SEEN
Trich, Wet Prep: NONE SEEN
Yeast Wet Prep HPF POC: NONE SEEN

## 2017-05-29 MED ORDER — EPINEPHRINE 0.3 MG/0.3ML IJ SOAJ: 0.3000 mg | Freq: Once | INTRAMUSCULAR | 1 refills | Status: AC

## 2017-05-29 MED ORDER — METRONIDAZOLE 500 MG PO TABS
500.0000 mg | ORAL_TABLET | Freq: Two times a day (BID) | ORAL | 0 refills | Status: DC
Start: 2017-05-29 — End: 2017-07-18

## 2017-05-29 MED FILL — metroNIDAZOLE 500 MG TABS: 500 | 7 days supply | Qty: 14 | Fill #0

## 2017-05-29 NOTE — ED Triage Notes (Signed)
She wants to get checked for STD. States her ex-boyfriend was treated for Tric.

## 2017-05-29 NOTE — ED Notes (Signed)
ED Provider at bedside. 
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
## 2017-05-30 LAB — GC/CHLAMYDIA PROBE AMP (~~LOC~~) NOT AT ARMC
CHLAMYDIA, DNA PROBE: NEGATIVE
NEISSERIA GONORRHEA: NEGATIVE

## 2017-06-07 DIAGNOSIS — Z79899 Other long term (current) drug therapy: Secondary | ICD-10-CM | POA: Insufficient documentation

## 2017-06-07 DIAGNOSIS — R4702 Dysphasia: Secondary | ICD-10-CM | POA: Insufficient documentation

## 2017-06-07 DIAGNOSIS — Z91040 Latex allergy status: Secondary | ICD-10-CM | POA: Insufficient documentation

## 2017-06-07 DIAGNOSIS — J020 Streptococcal pharyngitis: Secondary | ICD-10-CM | POA: Insufficient documentation

## 2017-06-07 DIAGNOSIS — Z7983 Long term (current) use of bisphosphonates: Secondary | ICD-10-CM | POA: Insufficient documentation

## 2017-06-07 DIAGNOSIS — F17210 Nicotine dependence, cigarettes, uncomplicated: Secondary | ICD-10-CM | POA: Insufficient documentation

## 2017-06-08 ENCOUNTER — Emergency Department (HOSPITAL_COMMUNITY)
Admission: EM | Admit: 2017-06-08 | Discharge: 2017-06-08 | Disposition: A | Discharge status: Home / Self Care | Payer: Self-pay | Attending: Emergency Medicine | Admitting: Emergency Medicine

## 2017-06-08 ENCOUNTER — Encounter (HOSPITAL_COMMUNITY): Payer: Self-pay | Admitting: Emergency Medicine

## 2017-06-08 DIAGNOSIS — J020 Streptococcal pharyngitis: Secondary | ICD-10-CM

## 2017-06-08 LAB — RAPID STREP SCREEN (MED CTR MEBANE ONLY): STREPTOCOCCUS, GROUP A SCREEN (DIRECT): POSITIVE — AB

## 2017-06-08 MED ORDER — AMOXICILLIN 500 MG PO CAPS
ORAL_CAPSULE | ORAL | Status: AC
  Filled 2017-06-08: qty 1

## 2017-06-08 MED ORDER — DEXAMETHASONE SODIUM PHOSPHATE 10 MG/ML IJ SOLN
INTRAMUSCULAR | Status: AC
  Filled 2017-06-08: qty 1

## 2017-06-08 MED ORDER — ONDANSETRON 4 MG PO TBDP
ORAL_TABLET | ORAL | Status: AC
  Filled 2017-06-08: qty 1

## 2017-06-08 NOTE — ED Triage Notes (Addendum)
Pt reports that she is unable to swallow and has not been able to keep any foods or liquids down.  Pt reports hx of abscess on her tonsils.  No problems breathing or speaking and appears to be swallowing as she speaks.  Reports a fever for the past 24 hours, none now.
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
## 2017-07-18 ENCOUNTER — Emergency Department (HOSPITAL_BASED_OUTPATIENT_CLINIC_OR_DEPARTMENT_OTHER)
Admission: EM | Admit: 2017-07-18 | Discharge: 2017-07-18 | Disposition: A | Discharge status: Home / Self Care | Payer: Self-pay | Attending: Emergency Medicine | Admitting: Emergency Medicine

## 2017-07-18 ENCOUNTER — Encounter (HOSPITAL_BASED_OUTPATIENT_CLINIC_OR_DEPARTMENT_OTHER): Payer: Self-pay

## 2017-07-18 DIAGNOSIS — F17210 Nicotine dependence, cigarettes, uncomplicated: Secondary | ICD-10-CM | POA: Insufficient documentation

## 2017-07-18 DIAGNOSIS — Z91040 Latex allergy status: Secondary | ICD-10-CM | POA: Insufficient documentation

## 2017-07-18 DIAGNOSIS — J020 Streptococcal pharyngitis: Secondary | ICD-10-CM | POA: Insufficient documentation

## 2017-07-18 LAB — RAPID STREP SCREEN (MED CTR MEBANE ONLY): STREPTOCOCCUS, GROUP A SCREEN (DIRECT): POSITIVE — AB

## 2017-07-18 MED ORDER — AMOXICILLIN-POT CLAVULANATE 875-125 MG PO TABS
1.0000 | ORAL_TABLET | Freq: Once | ORAL | Status: AC
  Administered 2017-07-18: 1 via ORAL
  Filled 2017-07-18: qty 1

## 2017-07-18 MED ORDER — AMOXICILLIN-POT CLAVULANATE 875-125 MG PO TABS: 1.0000 | ORAL_TABLET | Freq: Two times a day (BID) | ORAL | 0 refills | Status: DC

## 2017-07-18 MED ORDER — DEXAMETHASONE SODIUM PHOSPHATE 10 MG/ML IJ SOLN
10.0000 mg | Freq: Once | INTRAMUSCULAR | Status: DC
  Filled 2017-07-18: qty 1

## 2017-07-18 MED FILL — AMOX-CLAV 875-125 MG TABLET: 875-125 | 10 days supply | Qty: 20 | Fill #0

## 2017-07-18 NOTE — ED Notes (Signed)
Pt screaming at mother and nurse, states she doesn't want a shot, refusing decadron IM

## 2017-07-18 NOTE — ED Triage Notes (Signed)
Pt c/o sore throat x 4 days-recent tx for strep-completed amoxil and did not complete clindamycin that was given 2nd time-NAD-steady gait
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
## 2017-07-18 NOTE — Discharge Instructions (Signed)
You were diagnosed with Strep Throat today. This is a bacterial infection. Please take all of your antibiotics until finished!  It is very important that you complete the entire course of this medication or the strep may not completely be treated.  You may develop abdominal discomfort or diarrhea from the antibiotic.  You may help offset this with probiotics which you can buy or get in yogurt. Do not eat or take the probiotics until 2 hours after your antibiotic. Do not take your medicine if develop an itchy rash, swelling in your mouth or lips, or difficulty breathing. Continue to stay well-hydrated. Gargle warm salt water and spit it out. Continued to alternate between Tylenol and ibuprofen for pain. May consider over-the-counter Benadryl for additional relief (decrease secretions). Also discard your toothbrush and begin using a new one in 3 days. Follow-up with your primary care doctor in this week for recheck of ongoing symptoms. Return to the ED sooner for worsening condition, inability to swallow, breathing difficulty, new concerns.

## 2017-09-18 ENCOUNTER — Emergency Department (HOSPITAL_BASED_OUTPATIENT_CLINIC_OR_DEPARTMENT_OTHER)
Admission: EM | Admit: 2017-09-18 | Discharge: 2017-09-18 | Disposition: A | Discharge status: Home / Self Care | Payer: Self-pay | Attending: Emergency Medicine | Admitting: Emergency Medicine

## 2017-09-18 ENCOUNTER — Encounter (HOSPITAL_BASED_OUTPATIENT_CLINIC_OR_DEPARTMENT_OTHER): Payer: Self-pay | Admitting: *Deleted

## 2017-09-18 DIAGNOSIS — Z5321 Procedure and treatment not carried out due to patient leaving prior to being seen by health care provider: Secondary | ICD-10-CM | POA: Insufficient documentation

## 2017-09-18 LAB — RAPID URINE DRUG SCREEN, HOSP PERFORMED
Amphetamines: NOT DETECTED
BENZODIAZEPINES: NOT DETECTED
Barbiturates: NOT DETECTED
COCAINE: NOT DETECTED
OPIATES: NOT DETECTED
TETRAHYDROCANNABINOL: POSITIVE — AB

## 2017-09-18 LAB — URINALYSIS, ROUTINE W REFLEX MICROSCOPIC
BILIRUBIN URINE: NEGATIVE
Glucose, UA: NEGATIVE mg/dL
Hgb urine dipstick: NEGATIVE
Ketones, ur: NEGATIVE mg/dL
LEUKOCYTES UA: NEGATIVE
NITRITE: NEGATIVE
PH: 7.5 (ref 5.0–8.0)
Protein, ur: NEGATIVE mg/dL

## 2017-09-18 LAB — PREGNANCY, URINE: Preg Test, Ur: NEGATIVE

## 2017-09-18 NOTE — ED Notes (Signed)
Pt never arrived to room 6 after triage

## 2017-09-18 NOTE — ED Notes (Signed)
When asked for a urine specimen, pt states she is leaving cant wait. "must be an all day ordeal" per pt.

## 2017-09-18 NOTE — ED Notes (Signed)
Pt called in the lobby and the bistro area and no answer x1 RN Ivin Booty informed

## 2017-09-18 NOTE — ED Triage Notes (Signed)
Pt reports frequency, urgency and dysuria x 1 week. Has used otc azo and cranberry with no relief.

## 2017-09-18 NOTE — ED Triage Notes (Signed)
Pt checked in earlier today but had to leave for a personal matter. Pt states she is still have urinary frequency, urgency and dysuria.

## 2018-06-26 ENCOUNTER — Emergency Department (HOSPITAL_COMMUNITY)
Admission: EM | Admit: 2018-06-26 | Discharge: 2018-06-26 | Disposition: A | Discharge status: Home / Self Care | Payer: Self-pay | Attending: Emergency Medicine | Admitting: Emergency Medicine

## 2018-06-26 ENCOUNTER — Encounter (HOSPITAL_COMMUNITY): Payer: Self-pay | Admitting: Emergency Medicine

## 2018-06-26 ENCOUNTER — Other Ambulatory Visit: Payer: Self-pay

## 2018-06-26 DIAGNOSIS — Z5189 Encounter for other specified aftercare: Secondary | ICD-10-CM

## 2018-06-26 DIAGNOSIS — Z4800 Encounter for change or removal of nonsurgical wound dressing: Secondary | ICD-10-CM | POA: Insufficient documentation
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
## 2018-06-26 NOTE — ED Triage Notes (Signed)
Pt states she fell last Tuesday and skinned her left thigh/right elbow. Both sites are healing, but have had yellow drainage and are painful. Pt wants to have both sites checked to make sure they were healing correctly.

## 2018-06-26 NOTE — ED Notes (Signed)
Patient able to ambulate independently  

## 2018-06-26 NOTE — Discharge Instructions (Addendum)
°  Wound Care Clean the wound and surrounding area gently with tap water and mild soap. Rinse well and blot dry. Do not scrub the wound, as this may cause the wound edges to come apart. You may shower, but avoid submerging the wound, such as with a bath or swimming. Clean the wound daily to prevent infection. Do not use cleaners such as hydrogen peroxide or alcohol.   Scar reduction: The key to scar reduction is keeping the skin well hydrated and supple. Drinking plenty of water throughout the day (At least eight 8oz glasses of water a day) is essential to staying well hydrated.  Sun exposure: Keep the wound out of the sun. After the wound has healed, continue to protect it from the sun by wearing protective clothing or applying sunscreen.  Pain: You may use Tylenol, naproxen, or ibuprofen for pain.  Return to the ED should signs of infection arise, such as spreading redness, puffiness/swelling, pus draining from the wound, severe increase in pain, fever over 100.69F, or any other major issues.

## 2018-08-02 ENCOUNTER — Other Ambulatory Visit: Payer: Self-pay

## 2018-08-02 ENCOUNTER — Encounter (HOSPITAL_COMMUNITY): Payer: Self-pay | Admitting: Emergency Medicine

## 2018-08-02 ENCOUNTER — Emergency Department (HOSPITAL_COMMUNITY)
Admission: EM | Admit: 2018-08-02 | Discharge: 2018-08-02 | Disposition: A | Discharge status: Home / Self Care | Payer: Self-pay | Attending: Emergency Medicine | Admitting: Emergency Medicine

## 2018-08-02 ENCOUNTER — Emergency Department (HOSPITAL_COMMUNITY): Payer: Self-pay

## 2018-08-02 DIAGNOSIS — F17210 Nicotine dependence, cigarettes, uncomplicated: Secondary | ICD-10-CM | POA: Insufficient documentation

## 2018-08-02 DIAGNOSIS — R1012 Left upper quadrant pain: Secondary | ICD-10-CM

## 2018-08-02 DIAGNOSIS — K589 Irritable bowel syndrome without diarrhea: Secondary | ICD-10-CM | POA: Insufficient documentation

## 2018-08-02 DIAGNOSIS — K2970 Gastritis, unspecified, without bleeding: Secondary | ICD-10-CM | POA: Insufficient documentation

## 2018-08-02 LAB — COMPREHENSIVE METABOLIC PANEL
ALK PHOS: 91 U/L (ref 38–126)
ALT: 14 U/L (ref 0–44)
ANION GAP: 11 (ref 5–15)
AST: 16 U/L (ref 15–41)
Albumin: 4.1 g/dL (ref 3.5–5.0)
BUN: 6 mg/dL (ref 6–20)
CALCIUM: 9.7 mg/dL (ref 8.9–10.3)
CO2: 25 mmol/L (ref 22–32)
CREATININE: 0.85 mg/dL (ref 0.44–1.00)
Chloride: 105 mmol/L (ref 98–111)
GFR calc Af Amer: >60 mL/min (ref >60)
GFR calc non Af Amer: >60 mL/min (ref >60)
Glucose, Bld: 131 mg/dL — ABNORMAL HIGH (ref 70–99)
Potassium: 4.1 mmol/L (ref 3.5–5.1)
SODIUM: 141 mmol/L (ref 135–145)
Total Bilirubin: 0.6 mg/dL (ref 0.3–1.2)
Total Protein: 7.1 g/dL (ref 6.5–8.1)

## 2018-08-02 LAB — URINALYSIS, ROUTINE W REFLEX MICROSCOPIC
BACTERIA UA: NONE SEEN
Bilirubin Urine: NEGATIVE
Glucose, UA: NEGATIVE mg/dL
Ketones, ur: NEGATIVE mg/dL
Leukocytes, UA: NEGATIVE
Nitrite: NEGATIVE
Protein, ur: NEGATIVE mg/dL
SPECIFIC GRAVITY, URINE: 1.001 — AB (ref 1.005–1.030)
pH: 9 — ABNORMAL HIGH (ref 5.0–8.0)

## 2018-08-02 LAB — CBC
HCT: 41.9 % (ref 36.0–46.0)
HEMOGLOBIN: 13.6 g/dL (ref 12.0–15.0)
MCH: 30.1 pg (ref 26.0–34.0)
MCHC: 32.5 g/dL (ref 30.0–36.0)
MCV: 92.7 fL (ref 78.0–100.0)
Platelets: 195 10*3/uL (ref 150–400)
RBC: 4.52 MIL/uL (ref 3.87–5.11)
RDW: 13 % (ref 11.5–15.5)
WBC: 11.2 10*3/uL — ABNORMAL HIGH (ref 4.0–10.5)

## 2018-08-02 LAB — I-STAT BETA HCG BLOOD, ED (MC, WL, AP ONLY)

## 2018-08-02 LAB — LIPASE, BLOOD: Lipase: 27 U/L (ref 11–51)

## 2018-08-02 IMAGING — CT CT ABD-PELV W/ CM
2 of 4 series · 17 of 46 positions shown, 19 images · IV contrast (iopamidol)
Comparison: Chest, abdomen pelvis radiographs dated [DATE].
Abdomen and pelvis CT dated [DATE].

CLINICAL DATA: Left upper quadrant abdominal pain for the past 6
hours with nausea and vomiting.

EXAM:
CT ABDOMEN AND PELVIS WITH CONTRAST
TECHNIQUE: Multidetector CT imaging of the abdomen and pelvis was performed
using the standard protocol following bolus administration of
intravenous contrast.
CONTRAST:  100mL [G4] IOPAMIDOL ([G4]) INJECTION 61%

[Series 3: abd/ pelvis 5.0 i30f 2 · axial · 0.68mm/px · z∈[+841,+1261]mm · 14 of 93 slices shown, 16 images]
[im 5/93  soft-tissue]
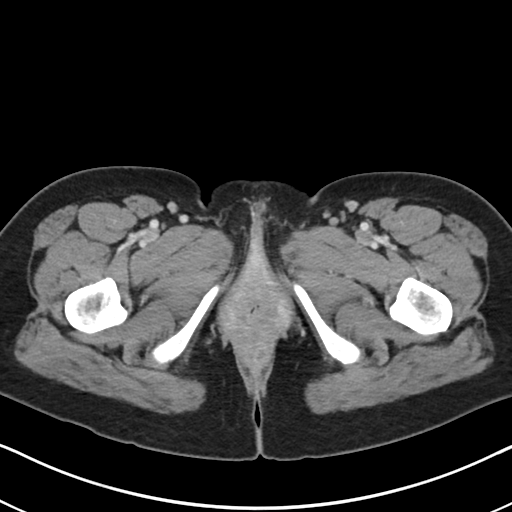
[im 5/93  bone]
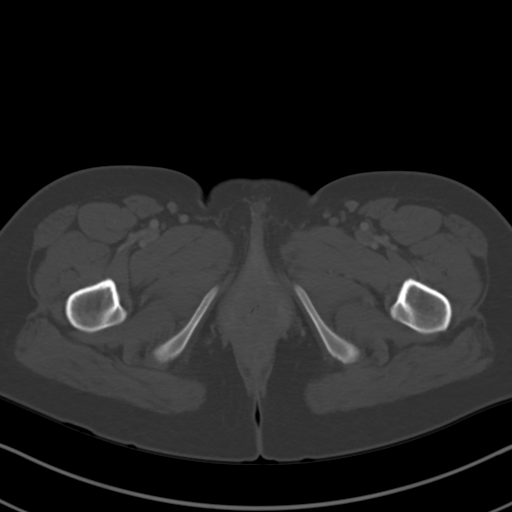
[im 13/93  soft-tissue]
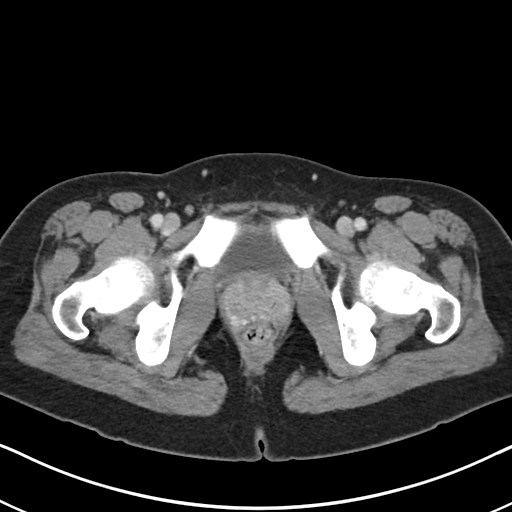
[im 17/93  soft-tissue]
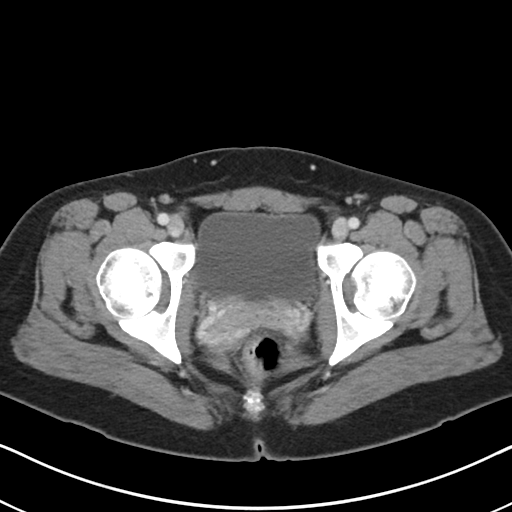
[im 25/93  soft-tissue]
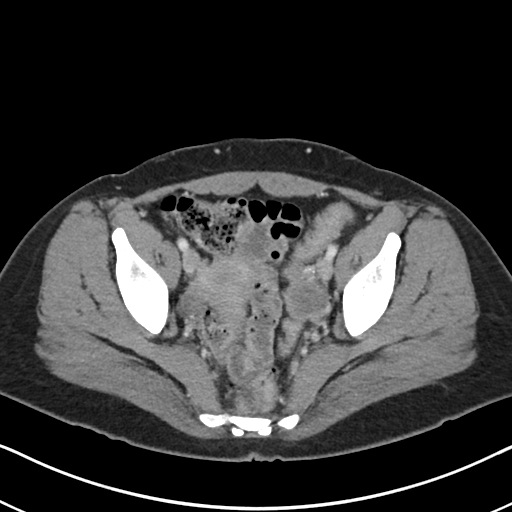
[im 33/93  soft-tissue]
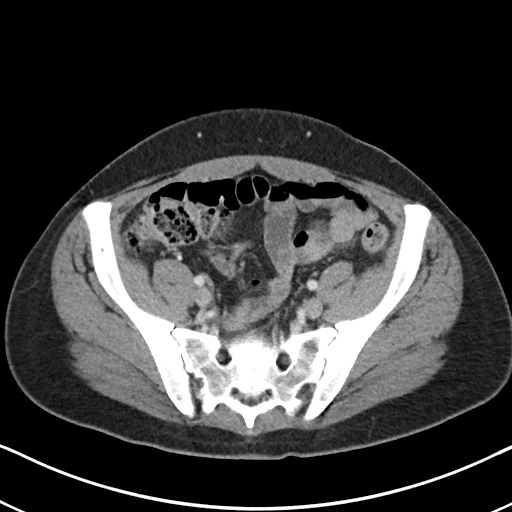
[im 37/93  soft-tissue]
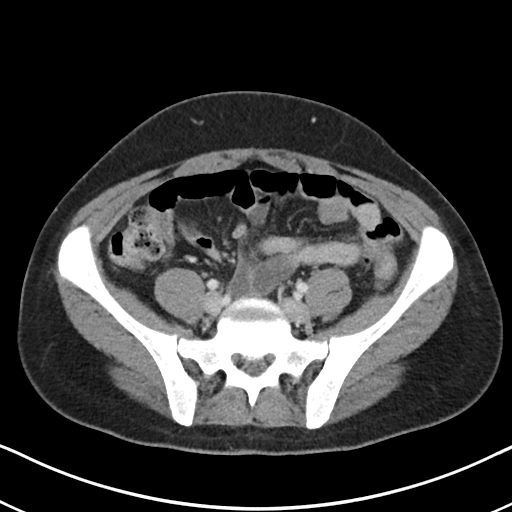
[im 45/93  soft-tissue]
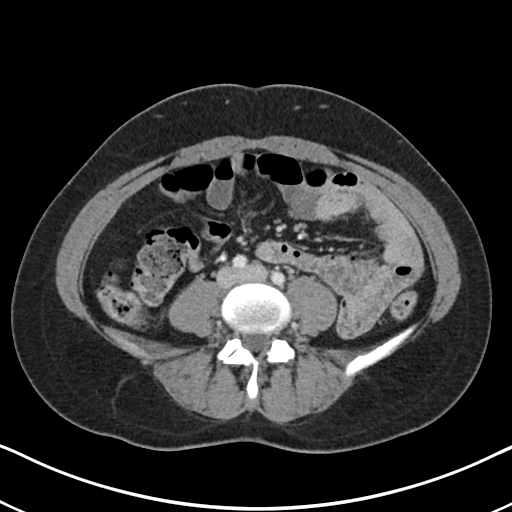
[im 49/93  soft-tissue]
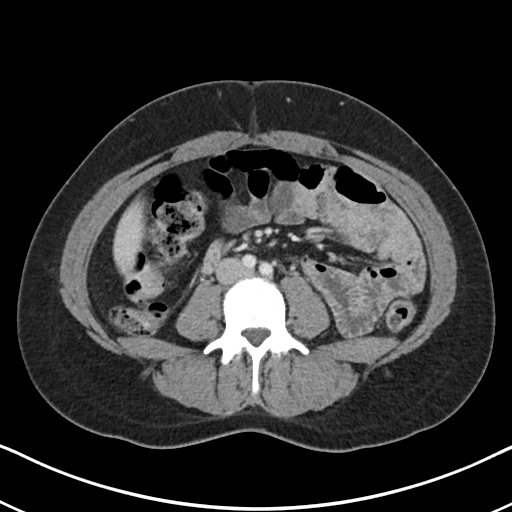
[im 57/93  soft-tissue]
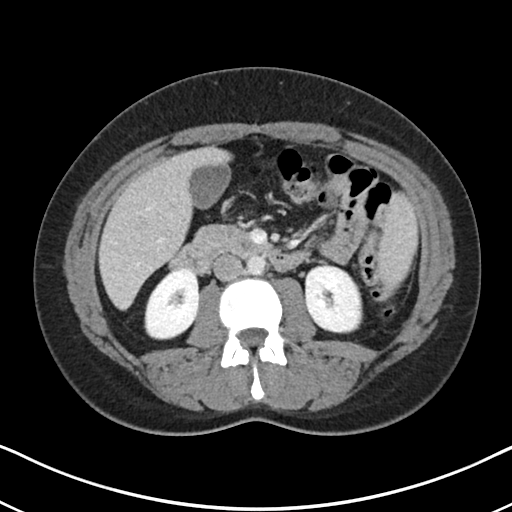
[im 57/93  bone]
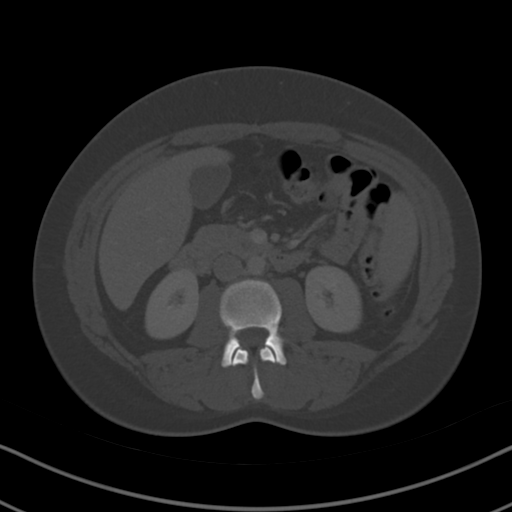
[im 61/93  soft-tissue]
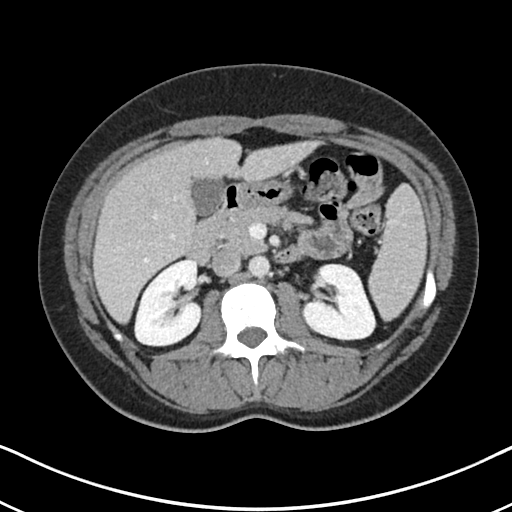
[im 69/93  soft-tissue]
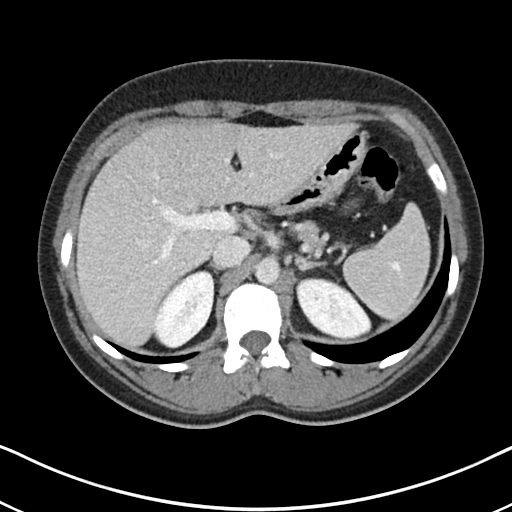
[im 77/93  soft-tissue]
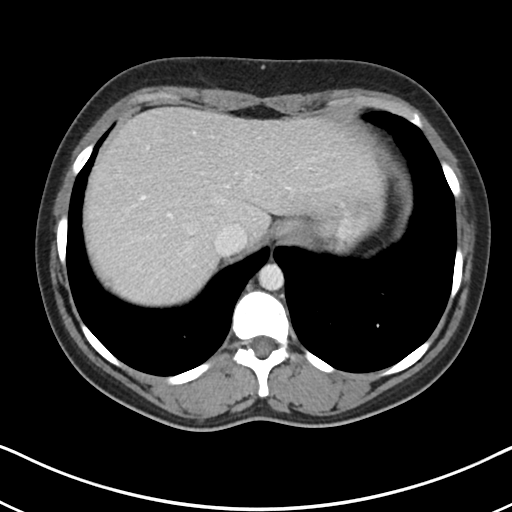
[im 81/93  soft-tissue]
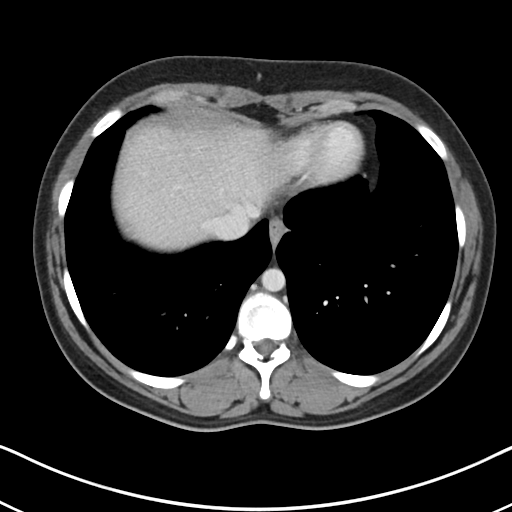
[im 89/93  soft-tissue]
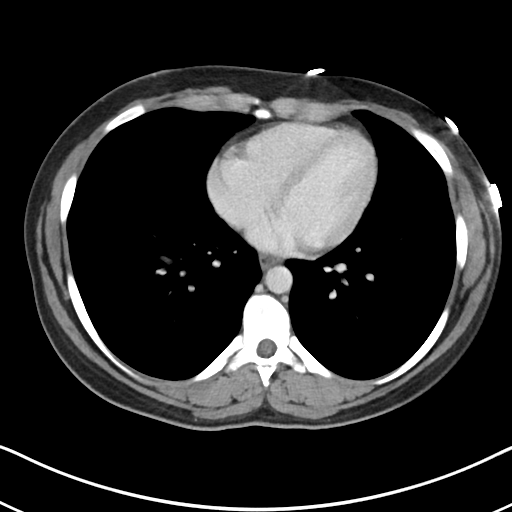

[Series 6: coronal soft tissue · coronal · 0.83mm/px · 3 of 101 slices shown]
[im 34/101  soft-tissue]
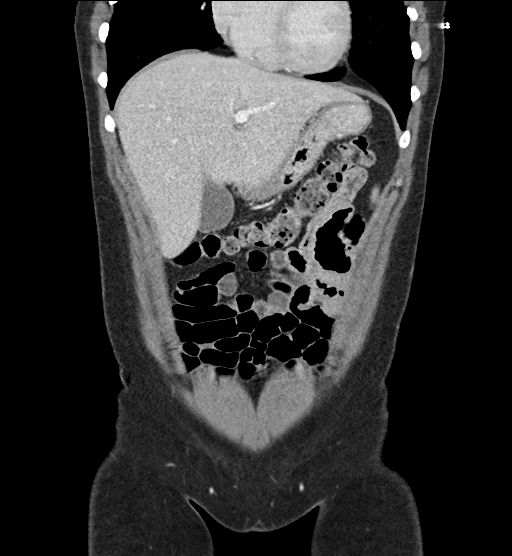
[im 45/101  soft-tissue]
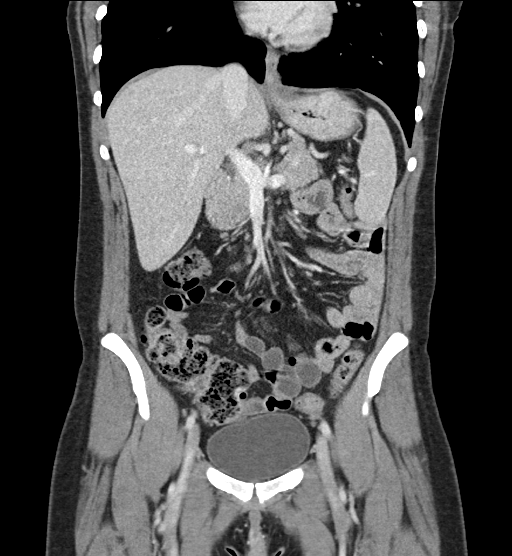
[im 56/101  soft-tissue]
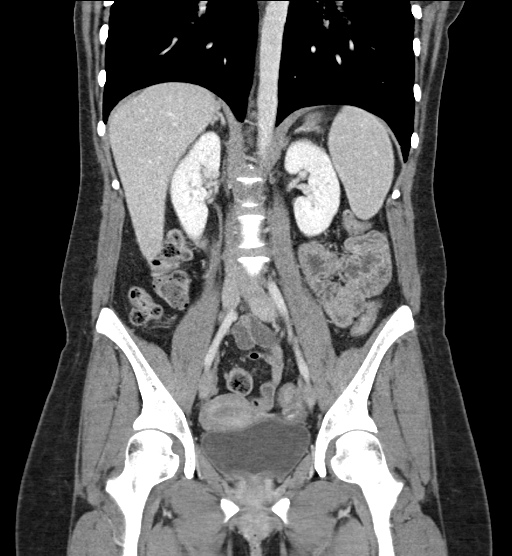

[17 of 46 positions shown; findings below may reference images not displayed]

FINDINGS: Lower chest: Clear lung bases.

Hepatobiliary: No focal liver abnormality is seen. No gallstones,
gallbladder wall thickening, or biliary dilatation.

Pancreas: Unremarkable. No pancreatic ductal dilatation or
surrounding inflammatory changes.

Spleen: Normal in size without focal abnormality.

Adrenals/Urinary Tract: Adrenal glands are unremarkable. Kidneys are
normal, without renal calculi, focal lesion, or hydronephrosis.
Bladder is unremarkable.

Stomach/Bowel: Unremarkable stomach, small bowel and colon. No
evidence of appendicitis.

Vascular/Lymphatic: No significant vascular findings are present. No
enlarged abdominal or pelvic lymph nodes.

Reproductive: Uterus and bilateral adnexa are unremarkable.

Other: No abdominal wall hernia or abnormality. No abdominopelvic
ascites.

Musculoskeletal: Mild lumbar and lower thoracic spine degenerative
changes and mild scoliosis.
IMPRESSION: No acute abnormality.

## 2018-08-02 MED ORDER — GI COCKTAIL ~~LOC~~
30.0000 mL | Freq: Once | ORAL | Status: AC
  Administered 2018-08-02: 30 mL via ORAL
  Filled 2018-08-02: qty 30

## 2018-08-02 MED ORDER — ONDANSETRON 4 MG PO TBDP: 4.0000 mg | ORAL_TABLET | Freq: Three times a day (TID) | ORAL | 0 refills | Status: DC | PRN

## 2018-08-02 MED ORDER — FAMOTIDINE IN NACL 20-0.9 MG/50ML-% IV SOLN
20.0000 mg | Freq: Once | INTRAVENOUS | Status: AC
  Administered 2018-08-02: 20 mg via INTRAVENOUS
  Filled 2018-08-02: qty 50

## 2018-08-02 MED ORDER — IOPAMIDOL (ISOVUE-300) INJECTION 61%
INTRAVENOUS | Status: AC
  Filled 2018-08-02: qty 100

## 2018-08-02 MED ORDER — DICYCLOMINE HCL 20 MG PO TABS: 20.0000 mg | ORAL_TABLET | Freq: Two times a day (BID) | ORAL | 0 refills | Status: DC

## 2018-08-02 MED ORDER — IOPAMIDOL (ISOVUE-300) INJECTION 61%
100.0000 mL | Freq: Once | INTRAVENOUS | Status: AC | PRN
  Administered 2018-08-02: 100 mL via INTRAVENOUS

## 2018-08-02 MED ORDER — ONDANSETRON HCL 4 MG/2ML IJ SOLN
4.0000 mg | Freq: Once | INTRAMUSCULAR | Status: AC
  Administered 2018-08-02: 4 mg via INTRAVENOUS
  Filled 2018-08-02: qty 2

## 2018-08-02 MED ORDER — FENTANYL CITRATE (PF) 100 MCG/2ML IJ SOLN
25.0000 ug | Freq: Once | INTRAMUSCULAR | Status: AC
  Administered 2018-08-02: 25 ug via INTRAVENOUS
  Filled 2018-08-02: qty 2

## 2018-08-02 MED ORDER — SODIUM CHLORIDE 0.9 % IV BOLUS
1000.0000 mL | Freq: Once | INTRAVENOUS | Status: AC
  Administered 2018-08-02: 1000 mL via INTRAVENOUS

## 2018-08-02 MED ORDER — SUCRALFATE 1 G PO TABS: 1.0000 g | ORAL_TABLET | Freq: Four times a day (QID) | ORAL | 0 refills | Status: DC | PRN

## 2018-08-02 NOTE — Discharge Instructions (Signed)
You were evaluated in the Emergency Department and after careful evaluation, we did not find any emergent condition requiring admission or further testing in the hospital.  Your symptoms today seem to be due to inflammation of the stomach.  Please use the medications provided as needed for symptoms.  Continue to use your famotidine at home.  Drink plenty of fluids at home, follow-up with your primary care provider.  Please return to the Emergency Department if you experience any worsening of your condition.  We encourage you to follow up with a primary care provider.  Thank you for allowing [place] to be a part of your care.
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
## 2018-08-02 NOTE — ED Notes (Signed)
Patient transported to CT 

## 2018-08-02 NOTE — ED Notes (Signed)
Pt informed she needs to provide a UA when possible; Pt verbalized understanding / given cup but does not have to go at this time.

## 2018-08-02 NOTE — ED Triage Notes (Signed)
Pt reports history of diverticulitis and started having LUQ pain 6 hours ago with nausea and vomiting.

## 2018-08-02 NOTE — ED Notes (Signed)
Pt given discharge instructions with prescriptions prior to ambulating to the lobby with friend. Pt had no concerns at time of discharge.

## 2018-08-15 ENCOUNTER — Encounter (HOSPITAL_COMMUNITY): Payer: Self-pay

## 2018-08-15 ENCOUNTER — Emergency Department (HOSPITAL_COMMUNITY)
Admission: EM | Admit: 2018-08-15 | Discharge: 2018-08-15 | Disposition: A | Discharge status: Home / Self Care | Payer: Self-pay | Attending: Emergency Medicine | Admitting: Emergency Medicine

## 2018-08-15 DIAGNOSIS — F17210 Nicotine dependence, cigarettes, uncomplicated: Secondary | ICD-10-CM | POA: Insufficient documentation

## 2018-08-15 DIAGNOSIS — Z91040 Latex allergy status: Secondary | ICD-10-CM | POA: Insufficient documentation

## 2018-08-15 DIAGNOSIS — Z79899 Other long term (current) drug therapy: Secondary | ICD-10-CM | POA: Insufficient documentation

## 2018-08-15 DIAGNOSIS — R112 Nausea with vomiting, unspecified: Secondary | ICD-10-CM | POA: Insufficient documentation

## 2018-08-15 DIAGNOSIS — R1013 Epigastric pain: Secondary | ICD-10-CM | POA: Insufficient documentation

## 2018-08-15 LAB — COMPREHENSIVE METABOLIC PANEL
ALBUMIN: 4.4 g/dL (ref 3.5–5.0)
ALT: 11 U/L (ref 0–44)
ANION GAP: 11 (ref 5–15)
AST: 15 U/L (ref 15–41)
Alkaline Phosphatase: 96 U/L (ref 38–126)
BUN: 5 mg/dL — AB (ref 6–20)
CHLORIDE: 105 mmol/L (ref 98–111)
CO2: 24 mmol/L (ref 22–32)
Calcium: 9.6 mg/dL (ref 8.9–10.3)
Creatinine, Ser: 0.79 mg/dL (ref 0.44–1.00)
GFR calc Af Amer: >60 mL/min (ref >60)
GFR calc non Af Amer: >60 mL/min (ref >60)
GLUCOSE: 107 mg/dL — AB (ref 70–99)
POTASSIUM: 4.1 mmol/L (ref 3.5–5.1)
Sodium: 140 mmol/L (ref 135–145)
TOTAL PROTEIN: 7.3 g/dL (ref 6.5–8.1)
Total Bilirubin: 0.4 mg/dL (ref 0.3–1.2)

## 2018-08-15 LAB — I-STAT BETA HCG BLOOD, ED (MC, WL, AP ONLY)

## 2018-08-15 LAB — URINALYSIS, ROUTINE W REFLEX MICROSCOPIC
Bilirubin Urine: NEGATIVE
Glucose, UA: NEGATIVE mg/dL
Hgb urine dipstick: NEGATIVE
KETONES UR: 20 mg/dL — AB
Leukocytes, UA: NEGATIVE
Nitrite: NEGATIVE
Protein, ur: 30 mg/dL — AB
SPECIFIC GRAVITY, URINE: 1.018 (ref 1.005–1.030)
pH: 8 (ref 5.0–8.0)

## 2018-08-15 LAB — CBC
HCT: 43.2 % (ref 36.0–46.0)
HEMOGLOBIN: 13.9 g/dL (ref 12.0–15.0)
MCH: 29.7 pg (ref 26.0–34.0)
MCHC: 32.2 g/dL (ref 30.0–36.0)
MCV: 92.3 fL (ref 78.0–100.0)
PLATELETS: 105 10*3/uL — AB (ref 150–400)
RBC: 4.68 MIL/uL (ref 3.87–5.11)
RDW: 12.9 % (ref 11.5–15.5)
WBC: 9.8 10*3/uL (ref 4.0–10.5)

## 2018-08-15 LAB — LIPASE, BLOOD: LIPASE: 22 U/L (ref 11–51)

## 2018-08-15 MED ORDER — CAPSAICIN 0.025 % EX CREA: TOPICAL_CREAM | Freq: Two times a day (BID) | CUTANEOUS | 0 refills | Status: DC | PRN

## 2018-08-15 MED ORDER — ONDANSETRON HCL 4 MG/2ML IJ SOLN
4.0000 mg | Freq: Once | INTRAMUSCULAR | Status: AC
  Administered 2018-08-15: 4 mg via INTRAVENOUS
  Filled 2018-08-15: qty 2

## 2018-08-15 MED ORDER — GI COCKTAIL ~~LOC~~
30.0000 mL | Freq: Once | ORAL | Status: AC
  Administered 2018-08-15: 30 mL via ORAL
  Filled 2018-08-15: qty 30

## 2018-08-15 MED ORDER — SODIUM CHLORIDE 0.9 % IV BOLUS
1000.0000 mL | Freq: Once | INTRAVENOUS | Status: AC
  Administered 2018-08-15: 1000 mL via INTRAVENOUS

## 2018-08-15 MED ORDER — FENTANYL CITRATE (PF) 100 MCG/2ML IJ SOLN
50.0000 ug | Freq: Once | INTRAMUSCULAR | Status: AC
  Administered 2018-08-15: 50 ug via INTRAVENOUS
  Filled 2018-08-15: qty 2

## 2018-08-15 MED ORDER — CAPSAICIN 0.025 % EX CREA
TOPICAL_CREAM | Freq: Once | CUTANEOUS | Status: AC
  Administered 2018-08-15: 19:00:00 via TOPICAL
  Filled 2018-08-15: qty 60

## 2018-08-15 NOTE — ED Notes (Signed)
Pt getting undressed and in gown.

## 2018-08-15 NOTE — ED Notes (Signed)
Pt given saltine crackers and ginger ale. Will continue to monitor.

## 2018-08-15 NOTE — ED Triage Notes (Signed)
Pt presents for evaluation of generalized abd pain. States hx of diverticulitis and she ate something with poppyseeds on it last night. Reports N/V, denies diarrhea.

## 2018-08-15 NOTE — Discharge Instructions (Signed)
Please follow-up with your primary doctor for further management.  Please use the nausea medicine that you have at home as well as consider using the topical cream you received today.  Please stay hydrated.  The symptoms may be related to your marijuana use.  If any symptoms change or worsen, please return to the nearest emergency department

## 2018-08-15 NOTE — ED Notes (Signed)
Pt refusing zofran.
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
## 2018-08-16 LAB — URINE CULTURE: CULTURE: NO GROWTH

## 2018-08-17 ENCOUNTER — Emergency Department (HOSPITAL_COMMUNITY)
Admission: EM | Admit: 2018-08-17 | Discharge: 2018-08-17 | Discharge status: Against Medical Advice | Payer: Self-pay | Attending: Emergency Medicine | Admitting: Emergency Medicine

## 2018-08-17 ENCOUNTER — Other Ambulatory Visit: Payer: Self-pay

## 2018-08-17 ENCOUNTER — Encounter (HOSPITAL_COMMUNITY): Payer: Self-pay

## 2018-08-17 DIAGNOSIS — M545 Low back pain: Secondary | ICD-10-CM | POA: Insufficient documentation

## 2018-08-17 NOTE — ED Triage Notes (Signed)
EMS report:  Pt presents with onset of low back pain since 0400 this morning.  Pt denies injury, has h/o chronic back pain.

## 2018-08-17 NOTE — ED Notes (Signed)
Went to assess pt and pt was not in the room. Name called in waiting room burt no answer, pt eloped

## 2018-08-17 NOTE — ED Provider Notes (Signed)
Patient placed in Quick Look pathway, seen and evaluated   Chief Complaint: back pain  HPI:   Gina Dorsey is a 27 y.o. female who presents to the ED with back pain. Patient reports " I think I dislocated my back, I threw it out at work last night and have numbness near my tailbone and my hip" (left sided). The incident occurred at 4 am. Patient with hx of chronic back pain. Patient denies UTI symptoms.   ROS: M/S: back pain  Physical Exam:  BP 134/74 (BP Location: Left Arm)   Pulse 65   Temp 98.4 F (36.9 C) (Oral)   Resp 18   Ht 5\' 4"  (1.626 m)   Wt 68 kg   LMP 08/05/2018   SpO2 98%   BMI 25.75 kg/m    Gen: No distress  Neuro: Awake and Alert  Skin: Warm and dry  Back: tender with palpation left lower back.    Initiation of care has begun. The patient has been counseled on the process, plan, and necessity for staying for the completion/evaluation, and the remainder of the medical screening examination    Ashley Murrain, NP 08/17/18 1449    Sherwood Gambler, MD 08/22/18 [phone]

## 2018-08-17 NOTE — ED Triage Notes (Signed)
Pt states " I think I dislocated my back, I threw it out at work last night and have numbness near my tailbone and my hip" (left sided).

## 2018-10-30 ENCOUNTER — Other Ambulatory Visit: Payer: Self-pay

## 2018-10-30 ENCOUNTER — Encounter (HOSPITAL_BASED_OUTPATIENT_CLINIC_OR_DEPARTMENT_OTHER): Payer: Self-pay | Admitting: *Deleted

## 2018-10-30 ENCOUNTER — Emergency Department (HOSPITAL_BASED_OUTPATIENT_CLINIC_OR_DEPARTMENT_OTHER)
Admission: EM | Admit: 2018-10-30 | Discharge: 2018-10-31 | Disposition: A | Discharge status: Home / Self Care | Payer: Self-pay | Attending: Emergency Medicine | Admitting: Emergency Medicine

## 2018-10-30 DIAGNOSIS — R112 Nausea with vomiting, unspecified: Secondary | ICD-10-CM | POA: Insufficient documentation

## 2018-10-30 DIAGNOSIS — R1084 Generalized abdominal pain: Secondary | ICD-10-CM | POA: Insufficient documentation

## 2018-10-30 DIAGNOSIS — Z79899 Other long term (current) drug therapy: Secondary | ICD-10-CM | POA: Insufficient documentation

## 2018-10-30 DIAGNOSIS — F17210 Nicotine dependence, cigarettes, uncomplicated: Secondary | ICD-10-CM | POA: Insufficient documentation

## 2018-10-30 LAB — URINALYSIS, ROUTINE W REFLEX MICROSCOPIC
Bilirubin Urine: NEGATIVE
Glucose, UA: NEGATIVE mg/dL
KETONES UR: 15 mg/dL — AB
LEUKOCYTES UA: NEGATIVE
Nitrite: NEGATIVE
PROTEIN: 30 mg/dL — AB
Specific Gravity, Urine: 1.01 (ref 1.005–1.030)
pH: >9 — ABNORMAL HIGH (ref 5.0–8.0)

## 2018-10-30 LAB — URINALYSIS, MICROSCOPIC (REFLEX)

## 2018-10-30 LAB — COMPREHENSIVE METABOLIC PANEL
ALT: 11 U/L (ref 0–44)
AST: 20 U/L (ref 15–41)
Albumin: 4.7 g/dL (ref 3.5–5.0)
Alkaline Phosphatase: 86 U/L (ref 38–126)
Anion gap: 13 (ref 5–15)
BUN: 7 mg/dL (ref 6–20)
CO2: 28 mmol/L (ref 22–32)
Calcium: 9.8 mg/dL (ref 8.9–10.3)
Chloride: 96 mmol/L — ABNORMAL LOW (ref 98–111)
Creatinine, Ser: 0.81 mg/dL (ref 0.44–1.00)
GFR calc Af Amer: >60 mL/min (ref >60)
GFR calc non Af Amer: >60 mL/min (ref >60)
Glucose, Bld: 151 mg/dL — ABNORMAL HIGH (ref 70–99)
Potassium: 2.9 mmol/L — ABNORMAL LOW (ref 3.5–5.1)
Sodium: 137 mmol/L (ref 135–145)
Total Bilirubin: 0.5 mg/dL (ref 0.3–1.2)
Total Protein: 7.9 g/dL (ref 6.5–8.1)

## 2018-10-30 LAB — CBC WITH DIFFERENTIAL/PLATELET
Abs Immature Granulocytes: 0.04 10*3/uL (ref 0.00–0.07)
Basophils Absolute: 0 10*3/uL (ref 0.0–0.1)
Basophils Relative: 0 %
Eosinophils Absolute: 0 10*3/uL (ref 0.0–0.5)
Eosinophils Relative: 0 %
HCT: 41.9 % (ref 36.0–46.0)
Hemoglobin: 13.9 g/dL (ref 12.0–15.0)
Immature Granulocytes: 0 %
Lymphocytes Relative: 12 %
Lymphs Abs: 1.7 10*3/uL (ref 0.7–4.0)
MCH: 30 pg (ref 26.0–34.0)
MCHC: 33.2 g/dL (ref 30.0–36.0)
MCV: 90.3 fL (ref 80.0–100.0)
Monocytes Absolute: 0.8 10*3/uL (ref 0.1–1.0)
Monocytes Relative: 6 %
Neutro Abs: 11.7 10*3/uL — ABNORMAL HIGH (ref 1.7–7.7)
Neutrophils Relative %: 82 %
Platelets: 142 10*3/uL — ABNORMAL LOW (ref 150–400)
RBC: 4.64 MIL/uL (ref 3.87–5.11)
RDW: 12.8 % (ref 11.5–15.5)
WBC: 14.2 10*3/uL — ABNORMAL HIGH (ref 4.0–10.5)
nRBC: 0 % (ref 0.0–0.2)

## 2018-10-30 LAB — LIPASE, BLOOD: Lipase: 39 U/L (ref 11–51)

## 2018-10-30 LAB — PREGNANCY, URINE: PREG TEST UR: NEGATIVE

## 2018-10-30 MED ORDER — POTASSIUM CHLORIDE CRYS ER 20 MEQ PO TBCR
60.0000 meq | EXTENDED_RELEASE_TABLET | Freq: Once | ORAL | Status: AC
  Administered 2018-10-30: 60 meq via ORAL
  Filled 2018-10-30: qty 3

## 2018-10-30 MED ORDER — ONDANSETRON HCL 4 MG/2ML IJ SOLN
4.0000 mg | Freq: Once | INTRAMUSCULAR | Status: AC
  Administered 2018-10-30: 4 mg via INTRAVENOUS
  Filled 2018-10-30: qty 2

## 2018-10-30 MED ORDER — SODIUM CHLORIDE 0.9 % IV BOLUS: 1000.0000 mL | Freq: Once | INTRAVENOUS | Status: DC

## 2018-10-30 MED ORDER — ALUM & MAG HYDROXIDE-SIMETH 200-200-20 MG/5ML PO SUSP
30.0000 mL | Freq: Once | ORAL | Status: AC
  Administered 2018-10-30: 30 mL via ORAL
  Filled 2018-10-30: qty 30

## 2018-10-30 MED ORDER — SODIUM CHLORIDE 0.9 % IV BOLUS
1000.0000 mL | Freq: Once | INTRAVENOUS | Status: AC
  Administered 2018-10-30: 1000 mL via INTRAVENOUS

## 2018-10-30 NOTE — ED Notes (Signed)
Patient aware that we need urine sample for testing, unable at this time. Says she has not had anything to eat or drink and she cannot urinate. Pt given instruction on providing urine sample when able to do so.
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
## 2018-10-30 NOTE — ED Notes (Signed)
ED Provider at bedside. 

## 2018-10-30 NOTE — ED Triage Notes (Signed)
Abdominal pain and vomiting. Menses today. Pain in her right lower quadrant.

## 2018-10-30 NOTE — ED Notes (Signed)
Pt states she is unable to urinate, requesting something for pain as well as for heartburn. States she should be able to urinate once her fluids are finished.

## 2018-10-31 MED ORDER — ONDANSETRON 4 MG PO TBDP: 4.0000 mg | ORAL_TABLET | Freq: Three times a day (TID) | ORAL | 0 refills | Status: DC | PRN

## 2018-10-31 MED ORDER — KETOROLAC TROMETHAMINE 30 MG/ML IJ SOLN
30.0000 mg | Freq: Once | INTRAMUSCULAR | Status: AC
  Administered 2018-10-31: 30 mg via INTRAVENOUS
  Filled 2018-10-31: qty 1

## 2018-10-31 MED ORDER — DICYCLOMINE HCL 20 MG PO TABS: 20.0000 mg | ORAL_TABLET | Freq: Two times a day (BID) | ORAL | 0 refills | Status: DC

## 2018-10-31 NOTE — Discharge Instructions (Addendum)
Take Zofran every 8 hours as needed for nausea or vomiting.  Take Bentyl twice daily as needed for abdominal cramping.  Please follow-up with your OB/GYN for further evaluation and treatment of your symptoms that seem to be related to menstrual cycle.  Please follow-up and establish care with one of the other offices as a PCP.  They can refer you for further evaluation of your chronic abdominal pain.  Please return the emergency room you develop any new or worsening symptoms

## 2018-11-01 ENCOUNTER — Encounter (HOSPITAL_COMMUNITY): Payer: Self-pay | Admitting: Emergency Medicine

## 2018-11-01 ENCOUNTER — Emergency Department (HOSPITAL_COMMUNITY)
Admission: EM | Admit: 2018-11-01 | Discharge: 2018-11-01 | Disposition: A | Discharge status: Home / Self Care | Payer: Self-pay | Attending: Emergency Medicine | Admitting: Emergency Medicine

## 2018-11-01 DIAGNOSIS — R112 Nausea with vomiting, unspecified: Secondary | ICD-10-CM | POA: Insufficient documentation

## 2018-11-01 DIAGNOSIS — Z79899 Other long term (current) drug therapy: Secondary | ICD-10-CM | POA: Insufficient documentation

## 2018-11-01 DIAGNOSIS — R109 Unspecified abdominal pain: Secondary | ICD-10-CM | POA: Insufficient documentation

## 2018-11-01 LAB — URINE CULTURE: Culture: NO GROWTH

## 2018-11-01 MED ORDER — DICYCLOMINE HCL 10 MG/ML IM SOLN
20.0000 mg | Freq: Once | INTRAMUSCULAR | Status: DC
  Filled 2018-11-01: qty 2

## 2018-11-01 MED ORDER — LACTATED RINGERS IV BOLUS: 1000.0000 mL | Freq: Once | INTRAVENOUS | Status: DC

## 2018-11-01 MED ORDER — ALUM & MAG HYDROXIDE-SIMETH 200-200-20 MG/5ML PO SUSP
30.0000 mL | Freq: Once | ORAL | Status: AC
  Administered 2018-11-01: 30 mL via ORAL
  Filled 2018-11-01: qty 30

## 2018-11-01 MED ORDER — LIDOCAINE VISCOUS HCL 2 % MT SOLN
15.0000 mL | Freq: Once | OROMUCOSAL | Status: AC
  Administered 2018-11-01: 15 mL via ORAL
  Filled 2018-11-01: qty 15

## 2018-11-01 MED ORDER — HALOPERIDOL LACTATE 5 MG/ML IJ SOLN: 2.0000 mg | Freq: Once | INTRAMUSCULAR | Status: DC

## 2018-11-01 MED ORDER — HALOPERIDOL LACTATE 5 MG/ML IJ SOLN
2.5000 mg | Freq: Once | INTRAMUSCULAR | Status: DC
  Filled 2018-11-01: qty 1

## 2018-11-01 NOTE — ED Triage Notes (Signed)
PT refused vitals when triaged .

## 2018-11-01 NOTE — ED Triage Notes (Signed)
PT absent from room . PT not located in front lobby.

## 2018-11-01 NOTE — ED Triage Notes (Signed)
Patient to ED c/o persistent abdominal pain, N/V - seen 2 days ago for the same but unable to fill Zofran d/t financial reasons.

## 2018-11-01 NOTE — ED Triage Notes (Signed)
PT refused IM injections because she only does IV meds.

## 2018-11-02 ENCOUNTER — Other Ambulatory Visit: Payer: Self-pay

## 2018-11-02 ENCOUNTER — Encounter (HOSPITAL_COMMUNITY): Payer: Self-pay | Admitting: *Deleted

## 2018-11-02 ENCOUNTER — Emergency Department (HOSPITAL_COMMUNITY)
Admission: EM | Admit: 2018-11-02 | Discharge: 2018-11-02 | Discharge status: Against Medical Advice | Payer: Self-pay | Attending: Emergency Medicine | Admitting: Emergency Medicine

## 2018-11-02 DIAGNOSIS — R109 Unspecified abdominal pain: Secondary | ICD-10-CM

## 2018-11-02 DIAGNOSIS — R1084 Generalized abdominal pain: Secondary | ICD-10-CM | POA: Insufficient documentation

## 2018-11-02 DIAGNOSIS — F17210 Nicotine dependence, cigarettes, uncomplicated: Secondary | ICD-10-CM | POA: Insufficient documentation

## 2018-11-02 DIAGNOSIS — Z91040 Latex allergy status: Secondary | ICD-10-CM | POA: Insufficient documentation

## 2018-11-02 LAB — COMPREHENSIVE METABOLIC PANEL
ALT: 11 U/L (ref 0–44)
ANION GAP: 11 (ref 5–15)
AST: 16 U/L (ref 15–41)
Albumin: 4 g/dL (ref 3.5–5.0)
Alkaline Phosphatase: 68 U/L (ref 38–126)
BUN: <5 mg/dL — ABNORMAL LOW (ref 6–20)
CO2: 30 mmol/L (ref 22–32)
Calcium: 9.1 mg/dL (ref 8.9–10.3)
Chloride: 100 mmol/L (ref 98–111)
Creatinine, Ser: 1.01 mg/dL — ABNORMAL HIGH (ref 0.44–1.00)
GFR calc non Af Amer: >60 mL/min (ref >60)
Glucose, Bld: 114 mg/dL — ABNORMAL HIGH (ref 70–99)
Potassium: 3.2 mmol/L — ABNORMAL LOW (ref 3.5–5.1)
SODIUM: 141 mmol/L (ref 135–145)
Total Bilirubin: 0.4 mg/dL (ref 0.3–1.2)
Total Protein: 6.9 g/dL (ref 6.5–8.1)

## 2018-11-02 LAB — URINALYSIS, ROUTINE W REFLEX MICROSCOPIC
Bilirubin Urine: NEGATIVE
Glucose, UA: NEGATIVE mg/dL
Ketones, ur: NEGATIVE mg/dL
Leukocytes, UA: NEGATIVE
Nitrite: NEGATIVE
PROTEIN: 30 mg/dL — AB
Specific Gravity, Urine: 1.015 (ref 1.005–1.030)
pH: 9 — ABNORMAL HIGH (ref 5.0–8.0)

## 2018-11-02 LAB — CBC WITH DIFFERENTIAL/PLATELET
Abs Immature Granulocytes: 0.02 10*3/uL (ref 0.00–0.07)
Basophils Absolute: 0 10*3/uL (ref 0.0–0.1)
Basophils Relative: 0 %
EOS PCT: 1 %
Eosinophils Absolute: 0.1 10*3/uL (ref 0.0–0.5)
HCT: 44.6 % (ref 36.0–46.0)
Hemoglobin: 14.5 g/dL (ref 12.0–15.0)
Immature Granulocytes: 0 %
Lymphocytes Relative: 25 %
Lymphs Abs: 2.1 10*3/uL (ref 0.7–4.0)
MCH: 29.5 pg (ref 26.0–34.0)
MCHC: 32.5 g/dL (ref 30.0–36.0)
MCV: 90.7 fL (ref 80.0–100.0)
MONO ABS: 0.5 10*3/uL (ref 0.1–1.0)
Monocytes Relative: 6 %
Neutro Abs: 5.7 10*3/uL (ref 1.7–7.7)
Neutrophils Relative %: 68 %
Platelets: UNDETERMINED 10*3/uL (ref 150–400)
RBC: 4.92 MIL/uL (ref 3.87–5.11)
RDW: 12.5 % (ref 11.5–15.5)
WBC: 8.5 10*3/uL (ref 4.0–10.5)
nRBC: 0 % (ref 0.0–0.2)

## 2018-11-02 MED ORDER — SODIUM CHLORIDE 0.9 % IV BOLUS
1000.0000 mL | Freq: Once | INTRAVENOUS | Status: AC
  Administered 2018-11-02: 1000 mL via INTRAVENOUS

## 2018-11-02 MED ORDER — FENTANYL CITRATE (PF) 100 MCG/2ML IJ SOLN
50.0000 ug | Freq: Once | INTRAMUSCULAR | Status: AC
  Administered 2018-11-02: 50 ug via INTRAVENOUS
  Filled 2018-11-02: qty 2

## 2018-11-02 MED ORDER — PROMETHAZINE HCL 25 MG/ML IJ SOLN: 12.5000 mg | Freq: Once | INTRAMUSCULAR | Status: DC

## 2018-11-02 MED ORDER — ONDANSETRON HCL 4 MG/2ML IJ SOLN
4.0000 mg | Freq: Once | INTRAMUSCULAR | Status: AC
  Administered 2018-11-02: 4 mg via INTRAVENOUS
  Filled 2018-11-02: qty 2

## 2018-11-02 MED ORDER — FENTANYL CITRATE (PF) 100 MCG/2ML IJ SOLN: 50.0000 ug | Freq: Once | INTRAMUSCULAR | Status: DC

## 2018-11-02 NOTE — ED Triage Notes (Signed)
Pt states she has been coming to the hospital for the last two weeks.  She states wakes up with left upper abdominal and chest pain. Pt reports vomiting and denies constipation or diarrhea.  No ETOH.  Hurts with deep breath.  Pt is wanting to wait to see a provider for blood because she has been here everyday.
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
## 2018-11-02 NOTE — ED Notes (Signed)
Received call from Agricultural consultant.  CMP for 6 clotted, will ask phlebotomy to collect
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
## 2018-11-02 NOTE — ED Notes (Signed)
Pt walked out of room. RN asked pt to stay. Pt states, "I have to get to work." RN requested to take out IV. Pt states, "I already took it out." Pt proceeds to walk out towards lobby.

## 2018-11-02 NOTE — ED Notes (Signed)
EDP aware patient called out stating nausea and pain medicine have not helped

## 2018-11-02 NOTE — ED Notes (Signed)
Patient called out requesting pain and nausea medicine.  EDP made aware.

## 2018-11-04 ENCOUNTER — Encounter (HOSPITAL_COMMUNITY): Payer: Self-pay

## 2018-11-04 ENCOUNTER — Other Ambulatory Visit: Payer: Self-pay

## 2018-11-04 ENCOUNTER — Emergency Department (HOSPITAL_COMMUNITY)
Admission: EM | Admit: 2018-11-04 | Discharge: 2018-11-04 | Discharge status: Against Medical Advice | Payer: Self-pay | Attending: Emergency Medicine | Admitting: Emergency Medicine

## 2018-11-04 DIAGNOSIS — F1290 Cannabis use, unspecified, uncomplicated: Secondary | ICD-10-CM | POA: Insufficient documentation

## 2018-11-04 DIAGNOSIS — R1032 Left lower quadrant pain: Secondary | ICD-10-CM | POA: Insufficient documentation

## 2018-11-04 DIAGNOSIS — R1110 Vomiting, unspecified: Secondary | ICD-10-CM | POA: Insufficient documentation

## 2018-11-04 DIAGNOSIS — R109 Unspecified abdominal pain: Secondary | ICD-10-CM

## 2018-11-04 DIAGNOSIS — F319 Bipolar disorder, unspecified: Secondary | ICD-10-CM | POA: Insufficient documentation

## 2018-11-04 DIAGNOSIS — Z91040 Latex allergy status: Secondary | ICD-10-CM | POA: Insufficient documentation

## 2018-11-04 DIAGNOSIS — F17210 Nicotine dependence, cigarettes, uncomplicated: Secondary | ICD-10-CM | POA: Insufficient documentation

## 2018-11-04 LAB — COMPREHENSIVE METABOLIC PANEL
ALT: 12 U/L (ref 0–44)
ANION GAP: 14 (ref 5–15)
AST: 16 U/L (ref 15–41)
Albumin: 4.3 g/dL (ref 3.5–5.0)
Alkaline Phosphatase: 73 U/L (ref 38–126)
BUN: 5 mg/dL — ABNORMAL LOW (ref 6–20)
CO2: 26 mmol/L (ref 22–32)
Calcium: 9.5 mg/dL (ref 8.9–10.3)
Chloride: 100 mmol/L (ref 98–111)
Creatinine, Ser: 0.83 mg/dL (ref 0.44–1.00)
GFR calc Af Amer: >60 mL/min (ref >60)
GFR calc non Af Amer: >60 mL/min (ref >60)
GLUCOSE: 122 mg/dL — AB (ref 70–99)
Potassium: 3.2 mmol/L — ABNORMAL LOW (ref 3.5–5.1)
Sodium: 140 mmol/L (ref 135–145)
Total Bilirubin: 0.5 mg/dL (ref 0.3–1.2)
Total Protein: 7.1 g/dL (ref 6.5–8.1)

## 2018-11-04 LAB — I-STAT BETA HCG BLOOD, ED (MC, WL, AP ONLY): I-stat hCG, quantitative: <5 m[IU]/mL (ref <5)

## 2018-11-04 LAB — LIPASE, BLOOD: Lipase: 27 U/L (ref 11–51)

## 2018-11-04 LAB — CBC
HCT: 43.2 % (ref 36.0–46.0)
Hemoglobin: 13.6 g/dL (ref 12.0–15.0)
MCH: 28.6 pg (ref 26.0–34.0)
MCHC: 31.5 g/dL (ref 30.0–36.0)
MCV: 90.9 fL (ref 80.0–100.0)
PLATELETS: 160 10*3/uL (ref 150–400)
RBC: 4.75 MIL/uL (ref 3.87–5.11)
RDW: 12.4 % (ref 11.5–15.5)
WBC: 10 10*3/uL (ref 4.0–10.5)
nRBC: 0 % (ref 0.0–0.2)

## 2018-11-04 MED ORDER — PROCHLORPERAZINE EDISYLATE 10 MG/2ML IJ SOLN
10.0000 mg | Freq: Once | INTRAMUSCULAR | Status: AC
  Administered 2018-11-04: 10 mg via INTRAVENOUS
  Filled 2018-11-04: qty 2

## 2018-11-04 MED ORDER — KETOROLAC TROMETHAMINE 30 MG/ML IJ SOLN
30.0000 mg | Freq: Once | INTRAMUSCULAR | Status: AC
  Administered 2018-11-04: 30 mg via INTRAVENOUS
  Filled 2018-11-04: qty 1

## 2018-11-04 MED ORDER — IOHEXOL 300 MG/ML  SOLN: 100.0000 mL | Freq: Once | INTRAMUSCULAR | Status: DC

## 2018-11-04 NOTE — ED Notes (Signed)
CT came to retrieve pt for scan and patient not in room. All of pt's possessions were gone and the IV was found in trash can. EDP notified of pt's elopement.

## 2018-11-04 NOTE — ED Triage Notes (Signed)
Pt states she has been coming all week for abdominal pain and vomiting and wants to get better.

## 2018-11-04 NOTE — ED Notes (Signed)
Patient transported to CT 
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
## 2018-12-29 ENCOUNTER — Emergency Department (HOSPITAL_BASED_OUTPATIENT_CLINIC_OR_DEPARTMENT_OTHER): Payer: Self-pay

## 2018-12-29 ENCOUNTER — Encounter (HOSPITAL_BASED_OUTPATIENT_CLINIC_OR_DEPARTMENT_OTHER): Payer: Self-pay | Admitting: *Deleted

## 2018-12-29 ENCOUNTER — Other Ambulatory Visit: Payer: Self-pay

## 2018-12-29 ENCOUNTER — Emergency Department (HOSPITAL_BASED_OUTPATIENT_CLINIC_OR_DEPARTMENT_OTHER)
Admission: EM | Admit: 2018-12-29 | Discharge: 2018-12-30 | Disposition: A | Discharge status: Home / Self Care | Payer: Self-pay | Attending: Emergency Medicine | Admitting: Emergency Medicine

## 2018-12-29 DIAGNOSIS — Y9289 Other specified places as the place of occurrence of the external cause: Secondary | ICD-10-CM | POA: Insufficient documentation

## 2018-12-29 DIAGNOSIS — S62636A Displaced fracture of distal phalanx of right little finger, initial encounter for closed fracture: Secondary | ICD-10-CM | POA: Insufficient documentation

## 2018-12-29 DIAGNOSIS — Y9389 Activity, other specified: Secondary | ICD-10-CM | POA: Insufficient documentation

## 2018-12-29 DIAGNOSIS — W230XXA Caught, crushed, jammed, or pinched between moving objects, initial encounter: Secondary | ICD-10-CM | POA: Insufficient documentation

## 2018-12-29 DIAGNOSIS — Y998 Other external cause status: Secondary | ICD-10-CM | POA: Insufficient documentation

## 2018-12-29 IMAGING — DX DG FINGER LITTLE 2+V*R*
3 series · 3 of 3 positions shown · non-contrast
Comparison: None.

CLINICAL DATA: Acute RIGHT little finger pain following car door
injury today. Initial encounter.

EXAM:
RIGHT LITTLE FINGER 2+V

[finger ap]
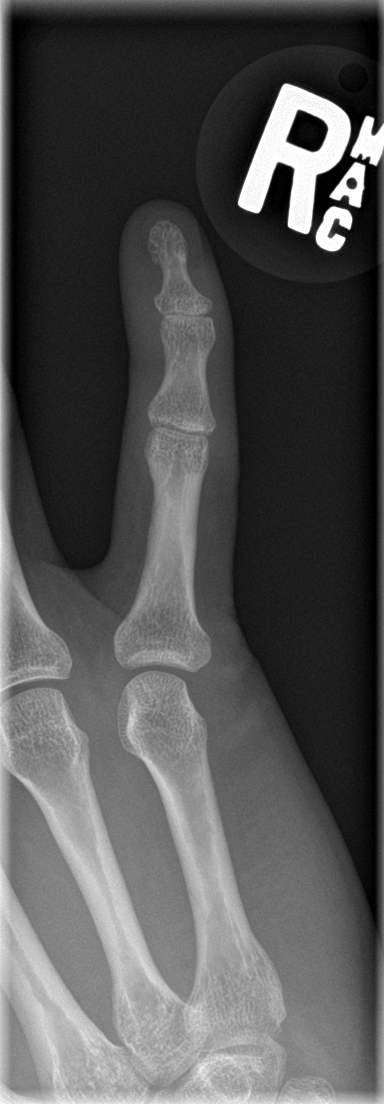

[finger obl]
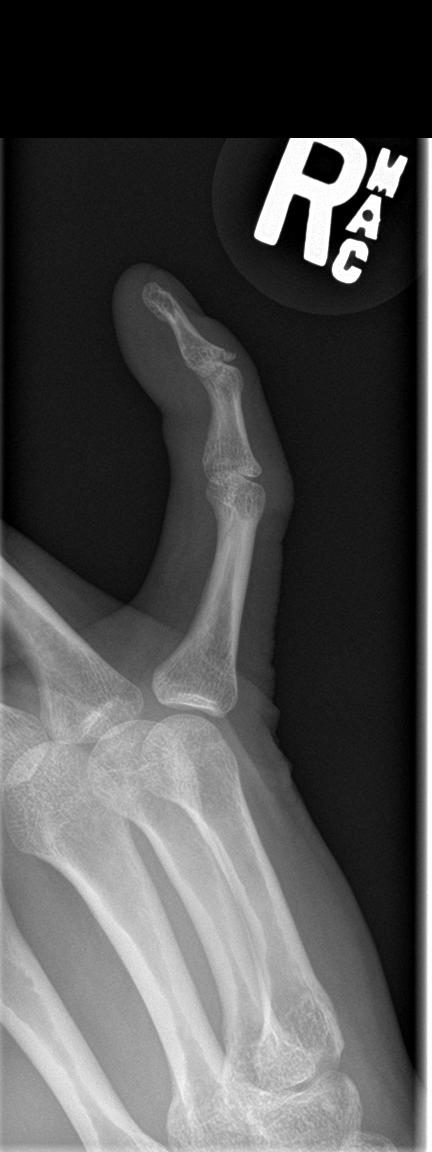

[finger lat]
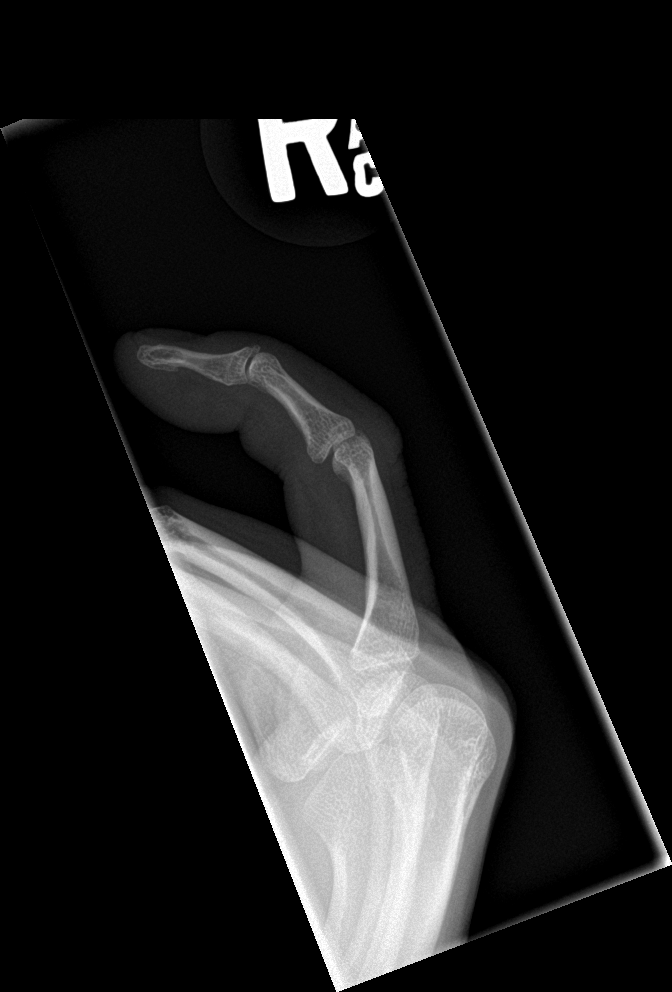

[3 of 3 positions shown; findings below may reference images not displayed]

FINDINGS: A small fracture fragment at the dorsal base of the distal phalanx
noted. This is of uncertain chronicity.

No dislocation.

No radiopaque foreign body.
IMPRESSION: Small fracture fragment at the dorsal base of the distal phalanx of
uncertain chronicity given appearance-correlate with pain..

## 2018-12-29 NOTE — ED Notes (Signed)
Patient transported to X-ray 

## 2018-12-29 NOTE — ED Triage Notes (Signed)
Presents with right pinky pain and bruising today. States she just noticed it. Unable to recall specifics of injury

## 2018-12-30 MED ORDER — IBUPROFEN 800 MG PO TABS
800.0000 mg | ORAL_TABLET | Freq: Once | ORAL | Status: AC
  Administered 2018-12-30: 800 mg via ORAL
  Filled 2018-12-30: qty 1
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
## 2018-12-30 NOTE — ED Notes (Signed)
Pt understood dc material. NAD noted. Work note given at [employer]. All questions answered to satisfaction.

## 2019-01-12 ENCOUNTER — Emergency Department (HOSPITAL_COMMUNITY)
Admission: EM | Admit: 2019-01-12 | Discharge: 2019-01-12 | Disposition: A | Discharge status: Home / Self Care | Payer: Self-pay | Attending: Emergency Medicine | Admitting: Emergency Medicine

## 2019-01-12 ENCOUNTER — Other Ambulatory Visit: Payer: Self-pay

## 2019-01-12 ENCOUNTER — Encounter (HOSPITAL_COMMUNITY): Payer: Self-pay | Admitting: Emergency Medicine

## 2019-01-12 ENCOUNTER — Emergency Department (HOSPITAL_COMMUNITY)
Admission: EM | Admit: 2019-01-12 | Discharge: 2019-01-13 | Disposition: A | Discharge status: Home / Self Care | Payer: Self-pay | Attending: Emergency Medicine | Admitting: Emergency Medicine

## 2019-01-12 ENCOUNTER — Encounter (HOSPITAL_COMMUNITY): Payer: Self-pay

## 2019-01-12 DIAGNOSIS — F17210 Nicotine dependence, cigarettes, uncomplicated: Secondary | ICD-10-CM | POA: Insufficient documentation

## 2019-01-12 DIAGNOSIS — Z91040 Latex allergy status: Secondary | ICD-10-CM | POA: Insufficient documentation

## 2019-01-12 DIAGNOSIS — R112 Nausea with vomiting, unspecified: Secondary | ICD-10-CM | POA: Insufficient documentation

## 2019-01-12 DIAGNOSIS — R197 Diarrhea, unspecified: Secondary | ICD-10-CM | POA: Insufficient documentation

## 2019-01-12 DIAGNOSIS — K589 Irritable bowel syndrome without diarrhea: Secondary | ICD-10-CM | POA: Insufficient documentation

## 2019-01-12 MED ORDER — ONDANSETRON HCL 4 MG PO TABS: 4.0000 mg | ORAL_TABLET | Freq: Four times a day (QID) | ORAL | 0 refills | Status: DC
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
## 2019-01-12 NOTE — ED Triage Notes (Signed)
Pt has N/V/D, has had several times in past.  Only wants a doctors note because she is not able to go to work today.

## 2019-01-12 NOTE — ED Triage Notes (Signed)
Pt c/o LUQ pain, nausea, and vomiting x 12 hours.  Seen in ED earlier for same and refused work-up.  Only wanted a doctor's note.  Pt states she took Zofran without relief.  Asked LMP and pt verbally aggressive and yelling "I'm not fu**ing pregnant, I'm a fu**ing lesbian."  Denies diarrhea.

## 2019-01-12 NOTE — ED Notes (Signed)
Pt walked out of ED when pt returned, stated "I need my f-ing discharge papers" Pt given papers and walked out again

## 2019-01-12 NOTE — Discharge Instructions (Signed)

## 2019-01-12 NOTE — ED Notes (Signed)
Pt actively vomiting at triage.

## 2019-01-13 LAB — URINALYSIS, ROUTINE W REFLEX MICROSCOPIC
BILIRUBIN URINE: NEGATIVE
Bacteria, UA: NONE SEEN
Glucose, UA: 50 mg/dL — AB
Hgb urine dipstick: NEGATIVE
Ketones, ur: 20 mg/dL — AB
Leukocytes,Ua: NEGATIVE
Nitrite: NEGATIVE
Protein, ur: 100 mg/dL — AB
Specific Gravity, Urine: 1.024 (ref 1.005–1.030)
pH: 8 (ref 5.0–8.0)

## 2019-01-13 LAB — COMPREHENSIVE METABOLIC PANEL
ALT: 14 U/L (ref 0–44)
AST: 19 U/L (ref 15–41)
Albumin: 4.6 g/dL (ref 3.5–5.0)
Alkaline Phosphatase: 102 U/L (ref 38–126)
Anion gap: 13 (ref 5–15)
BUN: 7 mg/dL (ref 6–20)
CO2: 29 mmol/L (ref 22–32)
Calcium: 10.1 mg/dL (ref 8.9–10.3)
Chloride: 98 mmol/L (ref 98–111)
Creatinine, Ser: 0.88 mg/dL (ref 0.44–1.00)
GFR calc Af Amer: >60 mL/min (ref >60)
Glucose, Bld: 138 mg/dL — ABNORMAL HIGH (ref 70–99)
Potassium: 3.7 mmol/L (ref 3.5–5.1)
Sodium: 140 mmol/L (ref 135–145)
Total Bilirubin: 0.4 mg/dL (ref 0.3–1.2)
Total Protein: 8.2 g/dL — ABNORMAL HIGH (ref 6.5–8.1)

## 2019-01-13 LAB — CBC WITH DIFFERENTIAL/PLATELET
Abs Immature Granulocytes: 0.08 10*3/uL — ABNORMAL HIGH (ref 0.00–0.07)
Basophils Absolute: 0 10*3/uL (ref 0.0–0.1)
Basophils Relative: 0 %
Eosinophils Absolute: 0 10*3/uL (ref 0.0–0.5)
Eosinophils Relative: 0 %
HCT: 44.3 % (ref 36.0–46.0)
Hemoglobin: 14.8 g/dL (ref 12.0–15.0)
IMMATURE GRANULOCYTES: 1 %
Lymphocytes Relative: 8 %
Lymphs Abs: 1.1 10*3/uL (ref 0.7–4.0)
MCH: 29.5 pg (ref 26.0–34.0)
MCHC: 33.4 g/dL (ref 30.0–36.0)
MCV: 88.2 fL (ref 80.0–100.0)
MONOS PCT: 2 %
Monocytes Absolute: 0.3 10*3/uL (ref 0.1–1.0)
Neutro Abs: 12.5 10*3/uL — ABNORMAL HIGH (ref 1.7–7.7)
Neutrophils Relative %: 89 %
Platelets: UNDETERMINED 10*3/uL (ref 150–400)
RBC: 5.02 MIL/uL (ref 3.87–5.11)
RDW: 12.8 % (ref 11.5–15.5)
WBC: 14.1 10*3/uL — ABNORMAL HIGH (ref 4.0–10.5)
nRBC: 0 % (ref 0.0–0.2)

## 2019-01-13 LAB — I-STAT BETA HCG BLOOD, ED (MC, WL, AP ONLY): I-stat hCG, quantitative: <5 m[IU]/mL (ref <5)

## 2019-01-13 LAB — MAGNESIUM: Magnesium: 1.9 mg/dL (ref 1.7–2.4)

## 2019-01-13 MED ORDER — DEXTROSE 5 % AND 0.45 % NACL IV BOLUS
500.0000 mL | Freq: Once | INTRAVENOUS | Status: AC
  Administered 2019-01-13: 500 mL via INTRAVENOUS

## 2019-01-13 MED ORDER — FAMOTIDINE IN NACL 20-0.9 MG/50ML-% IV SOLN
20.0000 mg | Freq: Once | INTRAVENOUS | Status: AC
  Administered 2019-01-13: 20 mg via INTRAVENOUS
  Filled 2019-01-13: qty 50

## 2019-01-13 MED ORDER — LORAZEPAM 2 MG/ML IJ SOLN
1.0000 mg | Freq: Once | INTRAMUSCULAR | Status: AC
  Administered 2019-01-13: 1 mg via INTRAVENOUS
  Filled 2019-01-13: qty 1

## 2019-01-13 MED ORDER — ALUM & MAG HYDROXIDE-SIMETH 200-200-20 MG/5ML PO SUSP
30.0000 mL | Freq: Once | ORAL | Status: AC
  Administered 2019-01-13: 30 mL via ORAL
  Filled 2019-01-13: qty 30

## 2019-01-13 MED ORDER — PROMETHAZINE HCL 25 MG PO TABS: 25.0000 mg | ORAL_TABLET | Freq: Four times a day (QID) | ORAL | 0 refills | Status: DC | PRN

## 2019-01-13 MED ORDER — ONDANSETRON HCL 4 MG/2ML IJ SOLN
4.0000 mg | Freq: Once | INTRAMUSCULAR | Status: AC
  Administered 2019-01-13: 4 mg via INTRAVENOUS
  Filled 2019-01-13: qty 2

## 2019-01-13 MED ORDER — SODIUM CHLORIDE 0.9 % IV BOLUS
1000.0000 mL | Freq: Once | INTRAVENOUS | Status: AC
  Administered 2019-01-13: 1000 mL via INTRAVENOUS

## 2019-01-13 MED ORDER — LIDOCAINE VISCOUS HCL 2 % MT SOLN
15.0000 mL | Freq: Once | OROMUCOSAL | Status: AC
  Administered 2019-01-13: 15 mL via ORAL
  Filled 2019-01-13: qty 15

## 2019-01-13 MED ORDER — PROMETHAZINE HCL 25 MG/ML IJ SOLN
25.0000 mg | Freq: Once | INTRAMUSCULAR | Status: AC
  Administered 2019-01-13: 25 mg via INTRAVENOUS
  Filled 2019-01-13: qty 1

## 2019-01-13 MED ORDER — PROMETHAZINE HCL 25 MG RE SUPP: 25.0000 mg | Freq: Four times a day (QID) | RECTAL | 0 refills | Status: DC | PRN
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
## 2019-01-13 NOTE — ED Notes (Signed)
Declined W/C at D/C and was escorted to lobby by RN. 

## 2019-01-13 NOTE — Discharge Instructions (Addendum)
We saw in the emergency room for nausea, vomiting.  Thankfully we were able to get your symptoms in better control.  Please take promethazine if Zofran is not working. We recommend that you get clear liquid diet for the next 2 or 3 days.  Advance her diet to more solid foods if you are tolerating the clear liquid diet well.

## 2019-01-14 ENCOUNTER — Encounter (HOSPITAL_COMMUNITY): Payer: Self-pay | Admitting: Emergency Medicine

## 2019-01-14 ENCOUNTER — Encounter (HOSPITAL_COMMUNITY): Payer: Self-pay

## 2019-01-14 ENCOUNTER — Emergency Department (HOSPITAL_COMMUNITY)
Admission: EM | Admit: 2019-01-14 | Discharge: 2019-01-14 | Disposition: A | Discharge status: Home / Self Care | Payer: Self-pay | Attending: Emergency Medicine | Admitting: Emergency Medicine

## 2019-01-14 ENCOUNTER — Other Ambulatory Visit: Payer: Self-pay

## 2019-01-14 ENCOUNTER — Emergency Department (HOSPITAL_COMMUNITY)
Admission: EM | Admit: 2019-01-14 | Discharge: 2019-01-14 | Discharge status: Against Medical Advice | Payer: Self-pay | Attending: Emergency Medicine | Admitting: Emergency Medicine

## 2019-01-14 ENCOUNTER — Emergency Department (HOSPITAL_COMMUNITY): Payer: Self-pay

## 2019-01-14 DIAGNOSIS — K589 Irritable bowel syndrome without diarrhea: Secondary | ICD-10-CM | POA: Insufficient documentation

## 2019-01-14 DIAGNOSIS — F17210 Nicotine dependence, cigarettes, uncomplicated: Secondary | ICD-10-CM | POA: Insufficient documentation

## 2019-01-14 DIAGNOSIS — R112 Nausea with vomiting, unspecified: Secondary | ICD-10-CM | POA: Insufficient documentation

## 2019-01-14 DIAGNOSIS — Z8719 Personal history of other diseases of the digestive system: Secondary | ICD-10-CM | POA: Insufficient documentation

## 2019-01-14 DIAGNOSIS — Z91040 Latex allergy status: Secondary | ICD-10-CM | POA: Insufficient documentation

## 2019-01-14 DIAGNOSIS — R1032 Left lower quadrant pain: Secondary | ICD-10-CM | POA: Insufficient documentation

## 2019-01-14 LAB — COMPREHENSIVE METABOLIC PANEL
ALT: 14 U/L (ref 0–44)
AST: 17 U/L (ref 15–41)
Albumin: 4.7 g/dL (ref 3.5–5.0)
Alkaline Phosphatase: 105 U/L (ref 38–126)
Anion gap: 11 (ref 5–15)
BUN: 12 mg/dL (ref 6–20)
CO2: 30 mmol/L (ref 22–32)
Calcium: 9.8 mg/dL (ref 8.9–10.3)
Chloride: 100 mmol/L (ref 98–111)
Creatinine, Ser: 0.89 mg/dL (ref 0.44–1.00)
GFR calc non Af Amer: >60 mL/min (ref >60)
Glucose, Bld: 119 mg/dL — ABNORMAL HIGH (ref 70–99)
Potassium: 3 mmol/L — ABNORMAL LOW (ref 3.5–5.1)
Sodium: 141 mmol/L (ref 135–145)
TOTAL PROTEIN: 7.9 g/dL (ref 6.5–8.1)
Total Bilirubin: 0.7 mg/dL (ref 0.3–1.2)

## 2019-01-14 LAB — CBC
HCT: 41.6 % (ref 36.0–46.0)
Hemoglobin: 13.7 g/dL (ref 12.0–15.0)
MCH: 29.9 pg (ref 26.0–34.0)
MCHC: 32.9 g/dL (ref 30.0–36.0)
MCV: 90.8 fL (ref 80.0–100.0)
PLATELETS: 222 10*3/uL (ref 150–400)
RBC: 4.58 MIL/uL (ref 3.87–5.11)
RDW: 13 % (ref 11.5–15.5)
WBC: 11.7 10*3/uL — ABNORMAL HIGH (ref 4.0–10.5)
nRBC: 0 % (ref 0.0–0.2)

## 2019-01-14 LAB — I-STAT BETA HCG BLOOD, ED (MC, WL, AP ONLY): I-stat hCG, quantitative: <5 m[IU]/mL (ref <5)

## 2019-01-14 LAB — LIPASE, BLOOD: Lipase: 31 U/L (ref 11–51)

## 2019-01-14 IMAGING — CT CT ABD-PELV W/ CM
2 of 4 series · 17 of 46 positions shown, 19 images · IV contrast (APPLIED)
Comparison: CT abdomen pelvis [DATE]

CLINICAL DATA: Acute abdominal pain

EXAM:
CT ABDOMEN AND PELVIS WITH CONTRAST
TECHNIQUE: Multidetector CT imaging of the abdomen and pelvis was performed
using the standard protocol following bolus administration of
intravenous contrast.
CONTRAST:  100mL OMNIPAQUE IOHEXOL 300 MG/ML  SOLN

[Series 3: abd/ pelvis 5.0 i30f 2 · axial · 0.69mm/px · z∈[+960,+1370]mm · 14 of 90 slices shown, 16 images]
[im 4/90  soft-tissue]
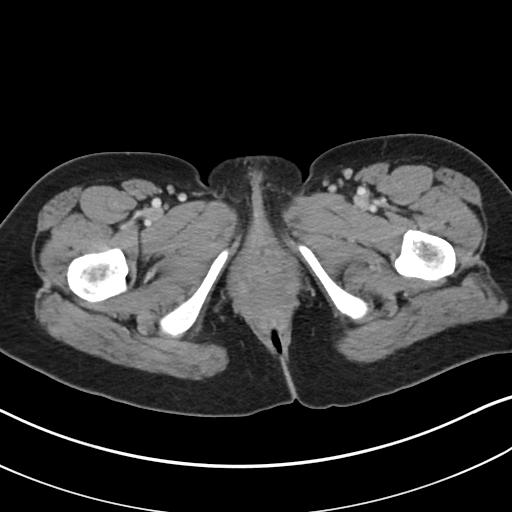
[im 4/90  bone]
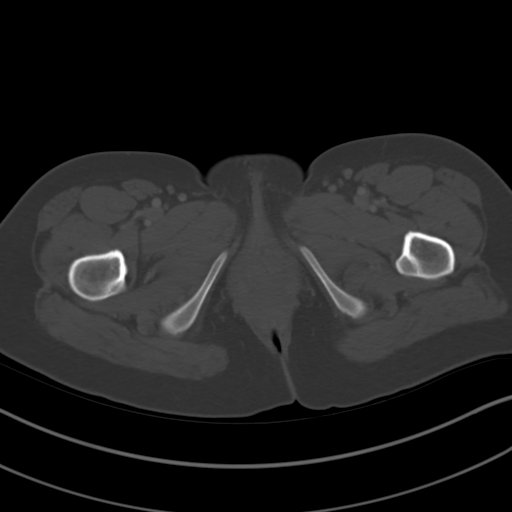
[im 12/90  soft-tissue]
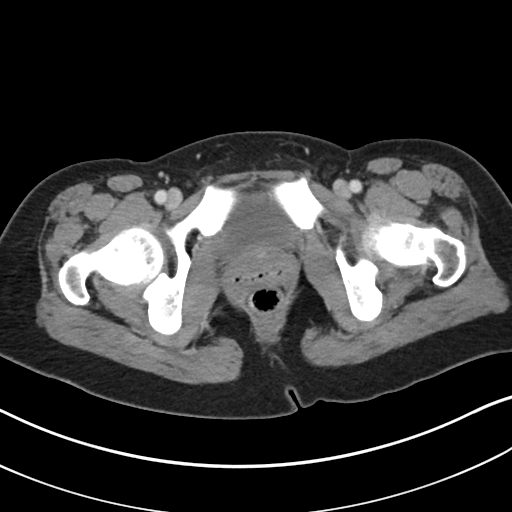
[im 19/90  soft-tissue]
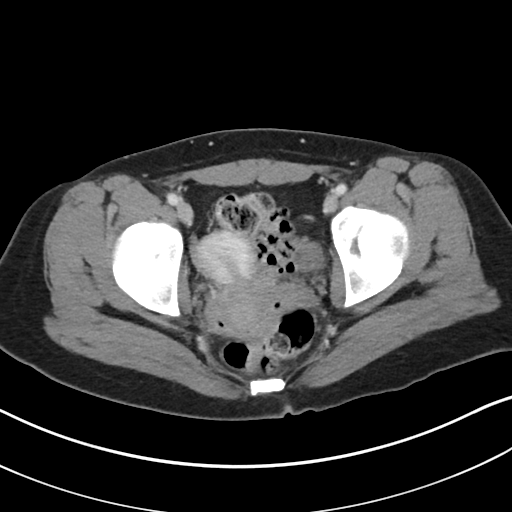
[im 23/90  soft-tissue]
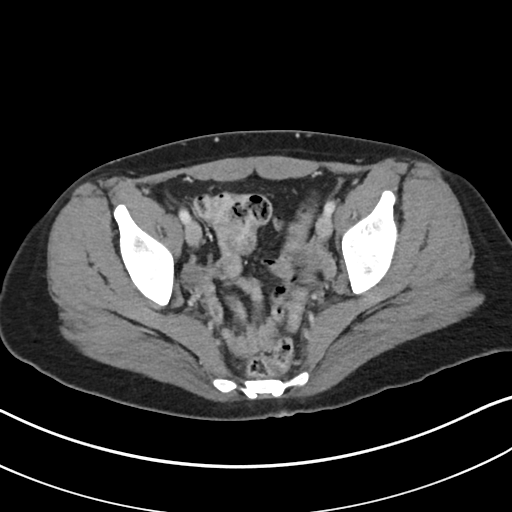
[im 30/90  soft-tissue]
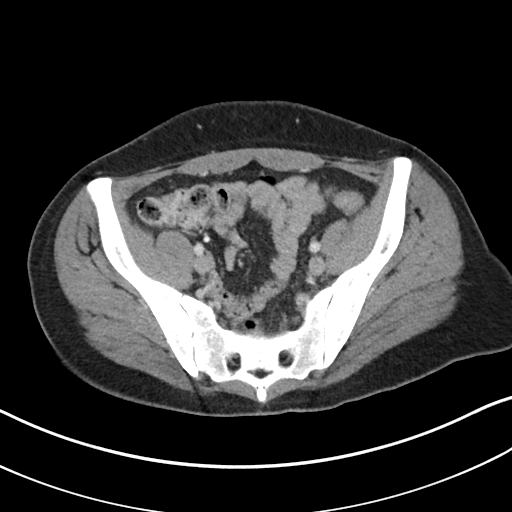
[im 38/90  soft-tissue]
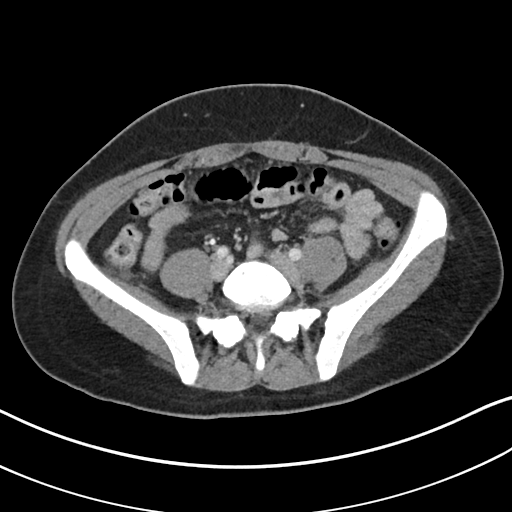
[im 41/90  soft-tissue]
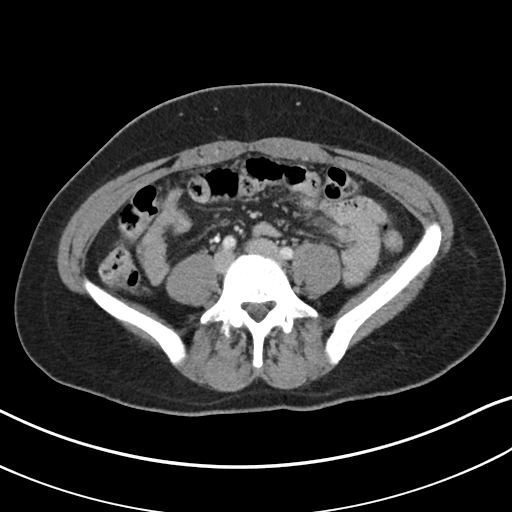
[im 49/90  soft-tissue]
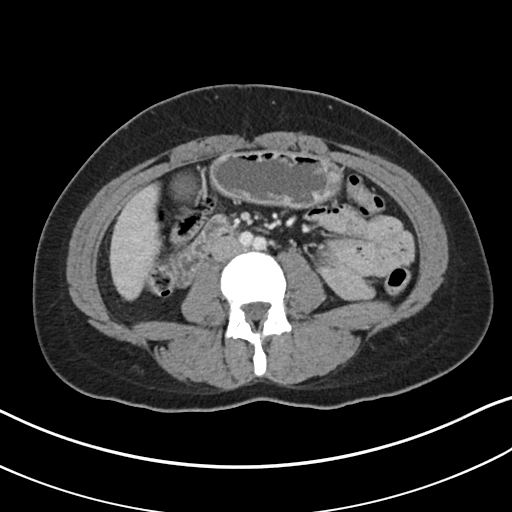
[im 52/90  soft-tissue]
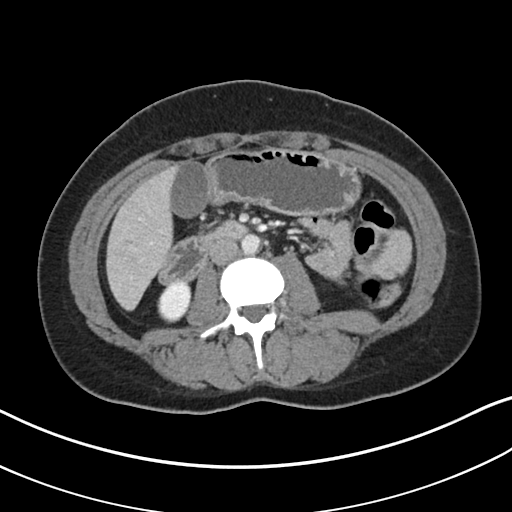
[im 52/90  bone]
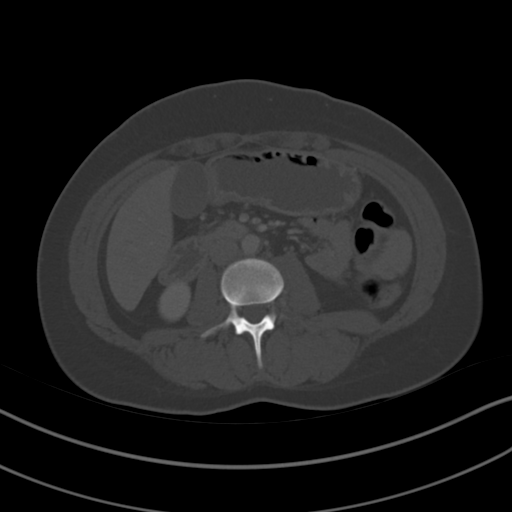
[im 60/90  soft-tissue]
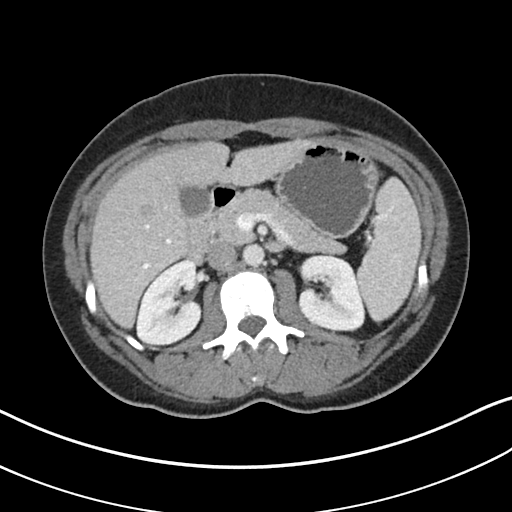
[im 67/90  soft-tissue]
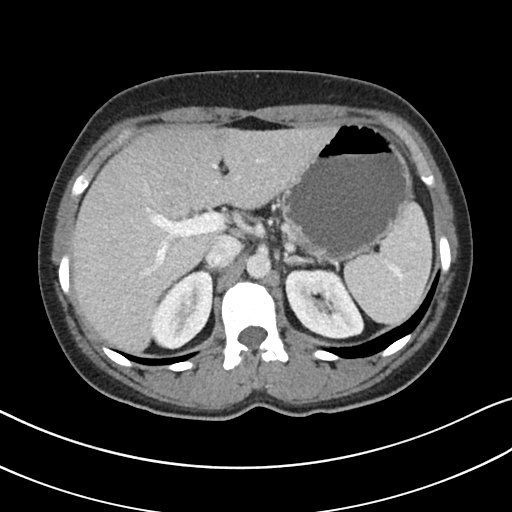
[im 71/90  soft-tissue]
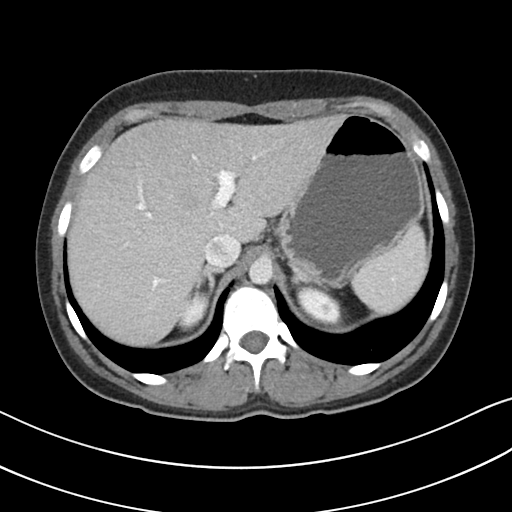
[im 78/90  soft-tissue]
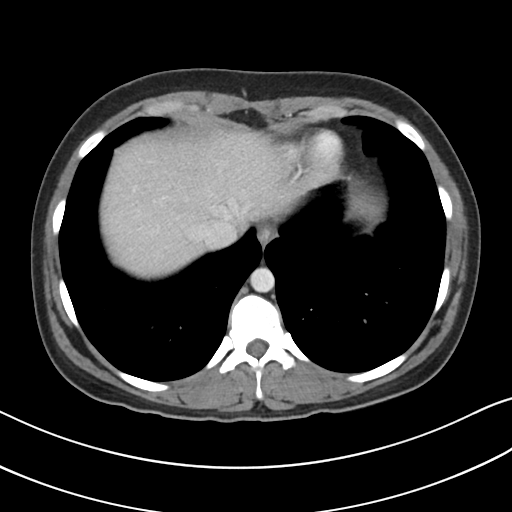
[im 86/90  soft-tissue]
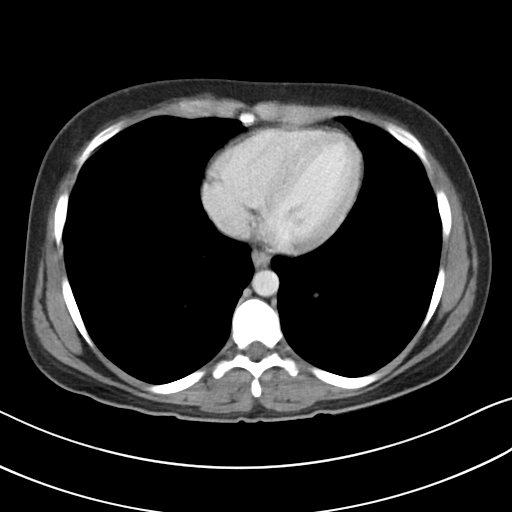

[Series 6: coronal soft tissue · coronal · 0.72mm/px · 3 of 98 slices shown]
[im 33/98  soft-tissue]
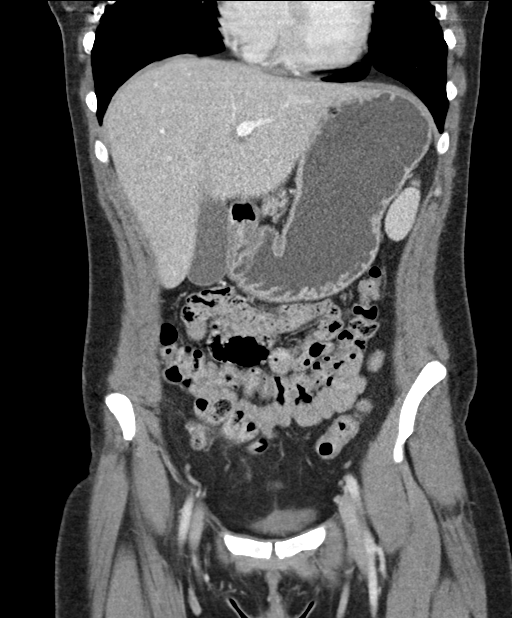
[im 44/98  soft-tissue]
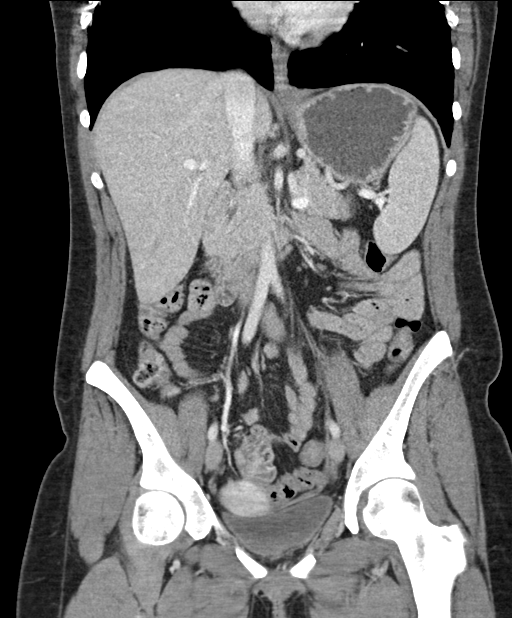
[im 54/98  soft-tissue]
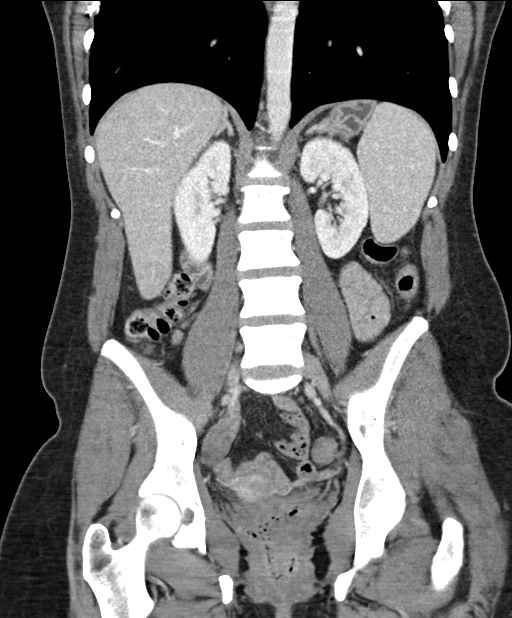

[17 of 46 positions shown; findings below may reference images not displayed]

FINDINGS: Lower chest: Lung bases clear

Hepatobiliary: No focal liver abnormality is seen. No gallstones,
gallbladder wall thickening, or biliary dilatation.

Pancreas: Negative

Spleen: Negative

Adrenals/Urinary Tract: Adrenal glands are unremarkable. Kidneys are
normal, without renal calculi, focal lesion, or hydronephrosis.
Bladder is unremarkable.

Stomach/Bowel: Negative for bowel obstruction or ileus. No bowel
mass or edema. Appendix not visualized.

Vascular/Lymphatic: No significant vascular findings are present. No
enlarged abdominal or pelvic lymph nodes.

Reproductive: Negative for pelvic mass or free fluid

Other: None

Musculoskeletal: Negative
IMPRESSION: Negative CT abdomen pelvis.  Appendix nonvisualized.

## 2019-01-14 MED ORDER — ONDANSETRON 4 MG PO TBDP: 4.0000 mg | ORAL_TABLET | Freq: Once | ORAL | Status: DC | PRN

## 2019-01-14 MED ORDER — PROMETHAZINE HCL 25 MG/ML IJ SOLN
25.0000 mg | Freq: Once | INTRAMUSCULAR | Status: AC
  Administered 2019-01-14: 25 mg via INTRAVENOUS
  Filled 2019-01-14: qty 1

## 2019-01-14 MED ORDER — KETOROLAC TROMETHAMINE 30 MG/ML IJ SOLN: 30.0000 mg | Freq: Once | INTRAMUSCULAR | Status: DC

## 2019-01-14 MED ORDER — IOHEXOL 300 MG/ML  SOLN
100.0000 mL | Freq: Once | INTRAMUSCULAR | Status: AC | PRN
  Administered 2019-01-14: 100 mL via INTRAVENOUS

## 2019-01-14 MED ORDER — DIPHENHYDRAMINE HCL 50 MG/ML IJ SOLN
25.0000 mg | Freq: Once | INTRAMUSCULAR | Status: AC
  Administered 2019-01-14: 25 mg via INTRAVENOUS
  Filled 2019-01-14: qty 1

## 2019-01-14 MED ORDER — POTASSIUM CHLORIDE CRYS ER 20 MEQ PO TBCR: 40.0000 meq | EXTENDED_RELEASE_TABLET | Freq: Once | ORAL | Status: DC

## 2019-01-14 MED ORDER — SODIUM CHLORIDE 0.9% FLUSH
3.0000 mL | Freq: Once | INTRAVENOUS | Status: AC
  Administered 2019-01-14: 3 mL via INTRAVENOUS

## 2019-01-14 MED ORDER — SODIUM CHLORIDE 0.9 % IV BOLUS
1000.0000 mL | Freq: Once | INTRAVENOUS | Status: AC
  Administered 2019-01-14: 1000 mL via INTRAVENOUS

## 2019-01-14 MED ORDER — FENTANYL CITRATE (PF) 100 MCG/2ML IJ SOLN
25.0000 ug | Freq: Once | INTRAMUSCULAR | Status: AC
  Administered 2019-01-14: 25 ug via INTRAVENOUS
  Filled 2019-01-14: qty 2

## 2019-01-14 MED ORDER — DICYCLOMINE HCL 10 MG/ML IM SOLN
20.0000 mg | Freq: Once | INTRAMUSCULAR | Status: DC
  Filled 2019-01-14: qty 2

## 2019-01-14 MED ORDER — ONDANSETRON HCL 4 MG/2ML IJ SOLN
4.0000 mg | Freq: Once | INTRAMUSCULAR | Status: AC
  Administered 2019-01-14: 4 mg via INTRAVENOUS
  Filled 2019-01-14: qty 2

## 2019-01-14 MED ORDER — DICYCLOMINE HCL 10 MG PO CAPS
20.0000 mg | ORAL_CAPSULE | Freq: Once | ORAL | Status: DC
  Filled 2019-01-14: qty 2

## 2019-01-14 MED ORDER — FAMOTIDINE IN NACL 20-0.9 MG/50ML-% IV SOLN
20.0000 mg | Freq: Once | INTRAVENOUS | Status: AC
  Administered 2019-01-14: 20 mg via INTRAVENOUS
  Filled 2019-01-14: qty 50

## 2019-01-14 MED ORDER — SODIUM CHLORIDE 0.9 % IV BOLUS (SEPSIS)
1000.0000 mL | Freq: Once | INTRAVENOUS | Status: AC
  Administered 2019-01-14: 1000 mL via INTRAVENOUS

## 2019-01-14 MED ORDER — HALOPERIDOL LACTATE 5 MG/ML IJ SOLN
2.5000 mg | Freq: Once | INTRAMUSCULAR | Status: AC
  Administered 2019-01-14: 2.5 mg via INTRAVENOUS
  Filled 2019-01-14: qty 1

## 2019-01-14 NOTE — ED Provider Notes (Signed)
TIME SEEN: 12:40 AM  CHIEF COMPLAINT: Abdominal pain, nausea and vomiting  HPI: Patient is a 28 year old female with history of bipolar disorder, IBS who presents to the emergency department with left-sided abdominal pain, nausea and vomiting for the past 2 days.  Seen in the emergency department recently for the same.  Had leukocytosis of 14,000.  States oral medications are not controlling her vomiting at home.  Increasing pain in the left upper and lower quadrants.  Family history of diverticulitis.  Denies fevers, sick contacts, recent travel, bad food exposure.  No dysuria, hematuria, vaginal bleeding or discharge.  Denies urinary retention despite nursing notes.  States she has had decreased urinary output and feels she is dehydrated.  She denies any diarrhea.  No previous abdominal surgeries.  ROS: See HPI Constitutional: no fever  Eyes: no drainage  ENT: no runny nose   Cardiovascular:  no chest pain  Resp: no SOB  GI:  vomiting GU: no dysuria Integumentary: no rash  Allergy: no hives  Musculoskeletal: no leg swelling  Neurological: no slurred speech ROS otherwise negative  PAST MEDICAL HISTORY/PAST SURGICAL HISTORY:  Past Medical History:  Diagnosis Date  . Anxiety   . Bipolar disorder ([place])    Dr. Jordan Hawks at [facility]  . Borderline personality disorder ([place])   . Chlamydia   . IBS (irritable bowel syndrome)    2008  . OCD (obsessive compulsive disorder)   . PTSD (post-traumatic stress disorder)     MEDICATIONS:  Prior to Admission medications   Medication Sig Start Date End Date Taking? Authorizing Provider  capsaicin (ZOSTRIX) 0.025 % cream Apply topically 2 (two) times daily as needed. For nausea, vomiting, and abdominal pain Patient not taking: Reported on 11/04/2018 08/15/18   Tegeler, Gwenyth Allegra, MD  dicyclomine (BENTYL) 20 MG tablet Take 1 tablet (20 mg total) by mouth 2 (two) times daily. Patient not taking: Reported on 11/04/2018 10/31/18   Frederica Kuster,  PA-C  ondansetron (ZOFRAN ODT) 4 MG disintegrating tablet Take 1 tablet (4 mg total) by mouth every 8 (eight) hours as needed for nausea or vomiting. Patient not taking: Reported on 11/04/2018 10/31/18   Frederica Kuster, PA-C  ondansetron (ZOFRAN) 4 MG tablet Take 1 tablet (4 mg total) by mouth every 6 (six) hours. Patient not taking: Reported on 01/12/2019 01/12/19   Couture, Cortni S, PA-C  promethazine (PHENERGAN) 25 MG suppository Place 1 suppository (25 mg total) rectally every 6 (six) hours as needed for nausea or vomiting. 01/13/19   Varney Biles, MD  promethazine (PHENERGAN) 25 MG tablet Take 1 tablet (25 mg total) by mouth every 6 (six) hours as needed for nausea or vomiting. 01/13/19   Varney Biles, MD  sucralfate (CARAFATE) 1 g tablet Take 1 tablet (1 g total) by mouth 4 (four) times daily as needed. Patient not taking: Reported on 11/04/2018 08/02/18   Maudie Flakes, MD    ALLERGIES:  Allergies  Allergen Reactions  . Mucinex [Guaifenesin Er] Anaphylaxis  . Sulfamethoxazole-Trimethoprim Anaphylaxis  . Latex Hives and Itching    burning   . Macrobid [Nitrofurantoin] Hives    Required the use of her epipen  . Morphine And Related Hives    Confusion and disorientation  . Sulfa Antibiotics Hives  . Wellbutrin [Bupropion] Nausea And Vomiting  . Celexa [Citalopram Hydrobromide] Hives    SOCIAL HISTORY:  Social History   Tobacco Use  . Smoking status: Current Every Day Smoker    Packs/day: 0.50  Years: 3.00    Pack years: 1.50    Types: Cigarettes  . Smokeless tobacco: Never Used  Substance Use Topics  . Alcohol use: No    FAMILY HISTORY: Family History  Problem Relation Age of Onset  . Diabetes Mother   . COPD Father     EXAM: BP (!) 136/93 (BP Location: Left Arm)   Pulse 68   Temp 98.5 F (36.9 C) (Oral)   Resp 16   Ht 5\' 5"  (1.651 m)   Wt 68 kg   LMP 12/15/2018   SpO2 100%   BMI 24.95 kg/m  CONSTITUTIONAL: Alert and oriented and responds  appropriately to questions. Well-appearing; well-nourished HEAD: Normocephalic EYES: Conjunctivae clear, pupils appear equal, EOMI ENT: normal nose; dry mucous membranes NECK: Supple, no meningismus, no nuchal rigidity, no LAD  CARD: RRR; S1 and S2 appreciated; no murmurs, no clicks, no rubs, no gallops RESP: Normal chest excursion without splinting or tachypnea; breath sounds clear and equal bilaterally; no wheezes, no rhonchi, no rales, no hypoxia or respiratory distress, speaking full sentences ABD/GI: Normal bowel sounds; non-distended; soft, ender in the left upper quadrant and left lower quadrant, no rebound, no guarding, no peritoneal signs, no hepatosplenomegaly BACK:  The back appears normal and is non-tender to palpation, there is no CVA tenderness EXT: Normal ROM in all joints; non-tender to palpation; no edema; normal capillary refill; no cyanosis, no calf tenderness or swelling    SKIN: Normal color for age and race; warm; no rash NEURO: Moves all extremities equally PSYCH: The patient's mood and manner are appropriate. Grooming and personal hygiene are appropriate.  MEDICAL DECISION MAKING: Patient here with complaints of worsening abdominal pain, nausea vomiting.  She does have left upper and lower quadrant tenderness on exam.  No tenderness at McBurney's point.  Negative Murphy sign.  Will repeat labs today, urinalysis.  Will give Bentyl, Zofran, IV fluids for symptomatic relief.  Will obtain CT scan to evaluate for possible diverticulitis, colitis.  Less likely perforation, bowel obstruction.  ED PROGRESS: Offered patient IM Bentyl, oral Bentyl and IV Toradol for pain control.  Patient refuses these.  She has not vomited in the emergency department.  She has not provided a urine sample.  Her labs are reassuring other than mild leukocytosis of 11.7 which is improved from 14,000 yesterday.  Potassium slightly low at 3.0.  We have offered patient CT of her abdomen pelvis for further  evaluation given worsening pain.  Patient now refuses.  States she would like her IV removed and is calling a ride to pick her up.  She has been provided with multiple prescriptions for nausea at home.  I do not feel that she needs further prescriptions from the emergency department at this time.  1:41 AM Patient has decided to leave against medical advice without further workup, treatment.  We discussed the nature and purpose, risks and benefits, as well as, the alternatives of treatment. Time was given to allow the opportunity to ask questions and consider their options, and after the discussion, the patient decided to refuse further testing and/or offered treatment. The patient was informed that refusal could lead to, but was not limited to, death, permanent disability, severe pain, worsening symptoms, disease progression. If present, I asked friends/family to dissuade them without success. Prior to refusing, I determined that the patient had the capacity to make their own decision, does not appear clinically intoxicated and understood the consequences of that decision and was able to verbalize them back  to me. Outpatient follow up has been encouraged and provided as needed.  Recommended patient return to the hospital if symptoms worsen, change or they would like further treatment and evaluation.      Charae Depaolis, Delice Bison, DO 01/14/19 [phone]

## 2019-01-14 NOTE — ED Notes (Signed)
Pt back from CT. Having episodes of vomiting. Provider notified and to order zofran.
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
## 2019-01-14 NOTE — Discharge Instructions (Signed)
Return to the ED for worsening symptoms, if you develop a fever, chest pain, worsening abdominal pain or vomiting up blood.

## 2019-01-14 NOTE — ED Triage Notes (Signed)
Pt endorses n/v and RLQ pain x 8 years. Hx of IBS and cyclic vomiting syndrome. Unable to keep anything down x 3 days. VSS.

## 2019-01-14 NOTE — ED Notes (Addendum)
Pt expresses wishes to leave AMA to RN, stating "You guys will do nothing different for me. What are you going to do? I not taking anymore medicine tonight." RN attempted to counsel pt. MD made aware.   Pt refused to update vitals.

## 2019-01-14 NOTE — ED Notes (Signed)
Pt in room screaming and crying stating "i'm in so much pain" No tears visible. Pt's significant other at bedside comforting patient. Provider aware.

## 2019-01-14 NOTE — ED Notes (Signed)
75 mcg of fentanyl wasted by this RN with Robbie Lis RN

## 2019-01-14 NOTE — ED Notes (Signed)
Performed a bladder scan. Scan only showed 66mL of urine in bladder

## 2019-01-14 NOTE — ED Triage Notes (Signed)
Patient was seen at [facility] yesterday. Patient complaining of nausea, vomiting, left abdominal pain, and has not been able to urinate for two days. Patient states she comes back because she still not feel good.

## 2019-01-14 NOTE — ED Notes (Signed)
Pt verbalized understanding of d/c instructions and no further questions, VSS, NAD. Pt much better after receiving fentanyl, zofran, benadryl and haldol. D/c home with significant other driving. Refused wheelchair.

## 2019-01-14 NOTE — ED Notes (Signed)
Pt was crying and anxious in room, as soon as this Rn told pt she would get pain medication pt was fine.

## 2019-04-13 ENCOUNTER — Emergency Department (HOSPITAL_COMMUNITY)
Admission: EM | Admit: 2019-04-13 | Discharge: 2019-04-13 | Disposition: A | Discharge status: Home / Self Care | Payer: Self-pay | Attending: Emergency Medicine | Admitting: Emergency Medicine

## 2019-04-13 ENCOUNTER — Other Ambulatory Visit: Payer: Self-pay

## 2019-04-13 ENCOUNTER — Encounter (HOSPITAL_COMMUNITY): Payer: Self-pay | Admitting: Emergency Medicine

## 2019-04-13 DIAGNOSIS — R1114 Bilious vomiting: Secondary | ICD-10-CM | POA: Insufficient documentation

## 2019-04-13 DIAGNOSIS — Z91040 Latex allergy status: Secondary | ICD-10-CM | POA: Insufficient documentation

## 2019-04-13 DIAGNOSIS — F17210 Nicotine dependence, cigarettes, uncomplicated: Secondary | ICD-10-CM | POA: Insufficient documentation

## 2019-04-13 LAB — URINALYSIS, ROUTINE W REFLEX MICROSCOPIC
Bilirubin Urine: NEGATIVE
Glucose, UA: NEGATIVE mg/dL
Ketones, ur: 20 mg/dL — AB
Leukocytes,Ua: NEGATIVE
Nitrite: NEGATIVE
Protein, ur: 30 mg/dL — AB
Specific Gravity, Urine: 1.025 (ref 1.005–1.030)
pH: 7 (ref 5.0–8.0)

## 2019-04-13 LAB — I-STAT BETA HCG BLOOD, ED (MC, WL, AP ONLY): I-stat hCG, quantitative: <5 m[IU]/mL (ref <5)

## 2019-04-13 LAB — COMPREHENSIVE METABOLIC PANEL
ALT: 11 U/L (ref 0–44)
AST: 15 U/L (ref 15–41)
Albumin: 5 g/dL (ref 3.5–5.0)
Alkaline Phosphatase: 106 U/L (ref 38–126)
Anion gap: 10 (ref 5–15)
BUN: 10 mg/dL (ref 6–20)
CO2: 25 mmol/L (ref 22–32)
Calcium: 10.1 mg/dL (ref 8.9–10.3)
Chloride: 108 mmol/L (ref 98–111)
Creatinine, Ser: 0.95 mg/dL (ref 0.44–1.00)
GFR calc Af Amer: >60 mL/min (ref >60)
GFR calc non Af Amer: >60 mL/min (ref >60)
Glucose, Bld: 133 mg/dL — ABNORMAL HIGH (ref 70–99)
Potassium: 4 mmol/L (ref 3.5–5.1)
Sodium: 143 mmol/L (ref 135–145)
Total Bilirubin: 0.2 mg/dL — ABNORMAL LOW (ref 0.3–1.2)
Total Protein: 8.4 g/dL — ABNORMAL HIGH (ref 6.5–8.1)

## 2019-04-13 LAB — CBC
HCT: 44.6 % (ref 36.0–46.0)
Hemoglobin: 14.8 g/dL (ref 12.0–15.0)
MCH: 30.5 pg (ref 26.0–34.0)
MCHC: 33.2 g/dL (ref 30.0–36.0)
MCV: 92 fL (ref 80.0–100.0)
Platelets: 196 10*3/uL (ref 150–400)
RBC: 4.85 MIL/uL (ref 3.87–5.11)
RDW: 12.7 % (ref 11.5–15.5)
WBC: 14 10*3/uL — ABNORMAL HIGH (ref 4.0–10.5)
nRBC: 0 % (ref 0.0–0.2)

## 2019-04-13 LAB — LIPASE, BLOOD: Lipase: 23 U/L (ref 11–51)

## 2019-04-13 MED ORDER — FAMOTIDINE IN NACL 20-0.9 MG/50ML-% IV SOLN
20.0000 mg | Freq: Once | INTRAVENOUS | Status: AC
  Administered 2019-04-13: 20 mg via INTRAVENOUS
  Filled 2019-04-13: qty 50

## 2019-04-13 MED ORDER — DICYCLOMINE HCL 10 MG/ML IM SOLN
20.0000 mg | Freq: Once | INTRAMUSCULAR | Status: DC
  Filled 2019-04-13: qty 2

## 2019-04-13 MED ORDER — PROCHLORPERAZINE EDISYLATE 10 MG/2ML IJ SOLN
10.0000 mg | Freq: Once | INTRAMUSCULAR | Status: AC
  Administered 2019-04-13: 10 mg via INTRAVENOUS
  Filled 2019-04-13: qty 2

## 2019-04-13 MED ORDER — SODIUM CHLORIDE 0.9% FLUSH
3.0000 mL | Freq: Once | INTRAVENOUS | Status: AC
  Administered 2019-04-13: 3 mL via INTRAVENOUS

## 2019-04-13 MED ORDER — DICYCLOMINE HCL 10 MG PO CAPS
10.0000 mg | ORAL_CAPSULE | Freq: Once | ORAL | Status: AC
  Administered 2019-04-13: 10 mg via ORAL
  Filled 2019-04-13: qty 1

## 2019-04-13 MED ORDER — ONDANSETRON 4 MG PO TBDP
4.0000 mg | ORAL_TABLET | Freq: Once | ORAL | Status: AC | PRN
  Administered 2019-04-13: 4 mg via ORAL
  Filled 2019-04-13: qty 1

## 2019-04-13 MED ORDER — SODIUM CHLORIDE 0.9 % IV BOLUS
1000.0000 mL | Freq: Once | INTRAVENOUS | Status: AC
  Administered 2019-04-13: 1000 mL via INTRAVENOUS

## 2019-04-13 NOTE — Discharge Instructions (Addendum)
Monitor your condition carefully, and do not hesitate to return here for concerning changes in your condition.

## 2019-04-13 NOTE — ED Triage Notes (Signed)
Patient complaining of vomiting for 12 hours. Patient states she has nothing to help it stop. She states she just want to go asleep.
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
## 2019-04-15 ENCOUNTER — Encounter (HOSPITAL_COMMUNITY): Payer: Self-pay

## 2019-04-15 ENCOUNTER — Emergency Department (HOSPITAL_COMMUNITY)
Admission: EM | Admit: 2019-04-15 | Discharge: 2019-04-15 | Disposition: A | Discharge status: Home / Self Care | Payer: Self-pay | Attending: Emergency Medicine | Admitting: Emergency Medicine

## 2019-04-15 ENCOUNTER — Emergency Department (HOSPITAL_COMMUNITY): Payer: Self-pay

## 2019-04-15 ENCOUNTER — Other Ambulatory Visit: Payer: Self-pay

## 2019-04-15 DIAGNOSIS — R1010 Upper abdominal pain, unspecified: Secondary | ICD-10-CM

## 2019-04-15 DIAGNOSIS — R1013 Epigastric pain: Secondary | ICD-10-CM | POA: Insufficient documentation

## 2019-04-15 DIAGNOSIS — F17210 Nicotine dependence, cigarettes, uncomplicated: Secondary | ICD-10-CM | POA: Insufficient documentation

## 2019-04-15 DIAGNOSIS — Z79899 Other long term (current) drug therapy: Secondary | ICD-10-CM | POA: Insufficient documentation

## 2019-04-15 DIAGNOSIS — E876 Hypokalemia: Secondary | ICD-10-CM | POA: Insufficient documentation

## 2019-04-15 LAB — URINALYSIS, ROUTINE W REFLEX MICROSCOPIC
Bacteria, UA: NONE SEEN
Bilirubin Urine: NEGATIVE
Glucose, UA: NEGATIVE mg/dL
Hgb urine dipstick: NEGATIVE
Ketones, ur: 80 mg/dL — AB
Leukocytes,Ua: NEGATIVE
Nitrite: NEGATIVE
Protein, ur: 30 mg/dL — AB
Specific Gravity, Urine: 1.021 (ref 1.005–1.030)
pH: 9 — ABNORMAL HIGH (ref 5.0–8.0)

## 2019-04-15 LAB — COMPREHENSIVE METABOLIC PANEL
ALT: 13 U/L (ref 0–44)
AST: 16 U/L (ref 15–41)
Albumin: 5 g/dL (ref 3.5–5.0)
Alkaline Phosphatase: 94 U/L (ref 38–126)
Anion gap: 12 (ref 5–15)
BUN: 16 mg/dL (ref 6–20)
CO2: 29 mmol/L (ref 22–32)
Calcium: 9.7 mg/dL (ref 8.9–10.3)
Chloride: 98 mmol/L (ref 98–111)
Creatinine, Ser: 0.96 mg/dL (ref 0.44–1.00)
GFR calc Af Amer: >60 mL/min (ref >60)
GFR calc non Af Amer: >60 mL/min (ref >60)
Glucose, Bld: 114 mg/dL — ABNORMAL HIGH (ref 70–99)
Potassium: 3 mmol/L — ABNORMAL LOW (ref 3.5–5.1)
Sodium: 139 mmol/L (ref 135–145)
Total Bilirubin: 0.7 mg/dL (ref 0.3–1.2)
Total Protein: 8.3 g/dL — ABNORMAL HIGH (ref 6.5–8.1)

## 2019-04-15 LAB — CBC WITH DIFFERENTIAL/PLATELET
Abs Immature Granulocytes: 0.01 10*3/uL (ref 0.00–0.07)
Basophils Absolute: 0 10*3/uL (ref 0.0–0.1)
Basophils Relative: 0 %
Eosinophils Absolute: 0.2 10*3/uL (ref 0.0–0.5)
Eosinophils Relative: 2 %
HCT: 43.3 % (ref 36.0–46.0)
Hemoglobin: 14.3 g/dL (ref 12.0–15.0)
Immature Granulocytes: 0 %
Lymphocytes Relative: 22 %
Lymphs Abs: 1.8 10*3/uL (ref 0.7–4.0)
MCH: 30 pg (ref 26.0–34.0)
MCHC: 33 g/dL (ref 30.0–36.0)
MCV: 90.8 fL (ref 80.0–100.0)
Monocytes Absolute: 0.5 10*3/uL (ref 0.1–1.0)
Monocytes Relative: 7 %
Neutro Abs: 5.5 10*3/uL (ref 1.7–7.7)
Neutrophils Relative %: 69 %
Platelets: 132 10*3/uL — ABNORMAL LOW (ref 150–400)
RBC: 4.77 MIL/uL (ref 3.87–5.11)
RDW: 12.6 % (ref 11.5–15.5)
WBC: 8.1 10*3/uL (ref 4.0–10.5)
nRBC: 0 % (ref 0.0–0.2)

## 2019-04-15 LAB — RAPID URINE DRUG SCREEN, HOSP PERFORMED
Amphetamines: NOT DETECTED
Barbiturates: NOT DETECTED
Benzodiazepines: POSITIVE — AB
Cocaine: NOT DETECTED
Opiates: NOT DETECTED
Tetrahydrocannabinol: POSITIVE — AB

## 2019-04-15 LAB — POC URINE PREG, ED: Preg Test, Ur: NEGATIVE

## 2019-04-15 LAB — LIPASE, BLOOD: Lipase: 26 U/L (ref 11–51)

## 2019-04-15 IMAGING — US ULTRASOUND ABDOMEN LIMITED
1 series · 14 of 25 positions shown · non-contrast
Comparison: None.

CLINICAL DATA: Right upper quadrant pain with nausea and vomiting

EXAM:
ULTRASOUND ABDOMEN LIMITED RIGHT UPPER QUADRANT

[Series 1: ultrasound abdomen limited · 14 of 37 slices shown]
[im 1/37]
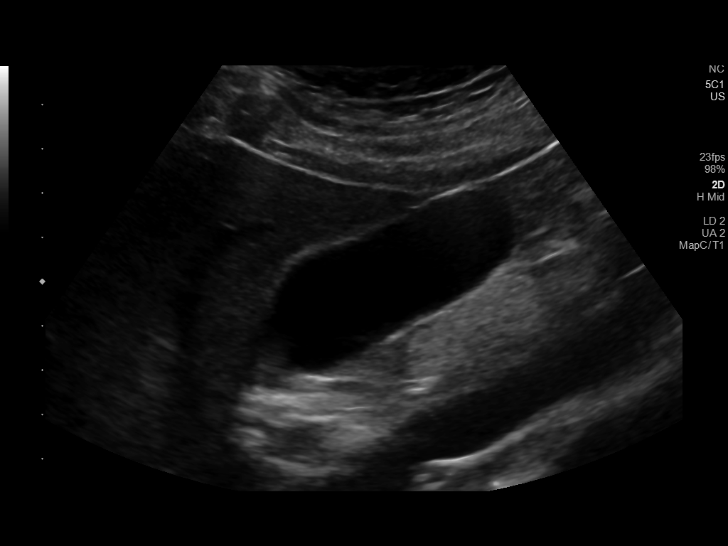
[im 4/37]
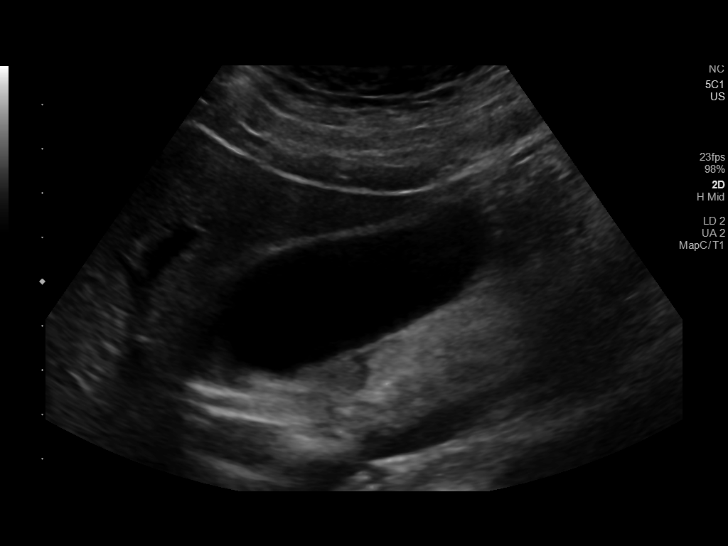
[im 7/37]
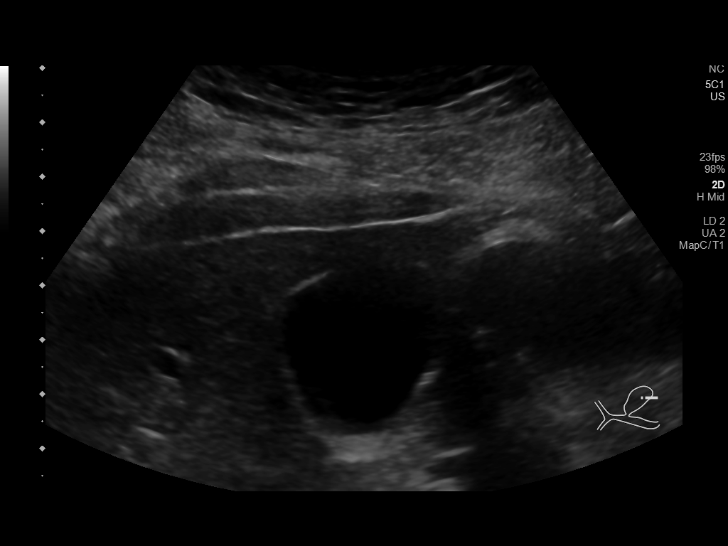
[im 10/37]
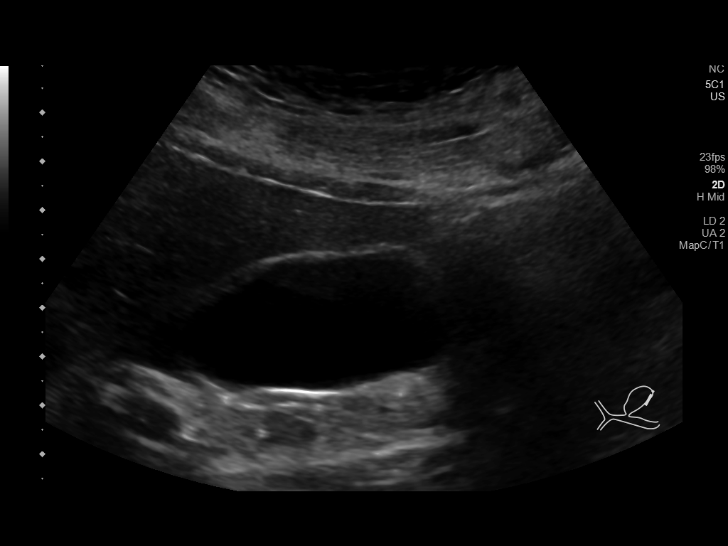
[im 13/37]
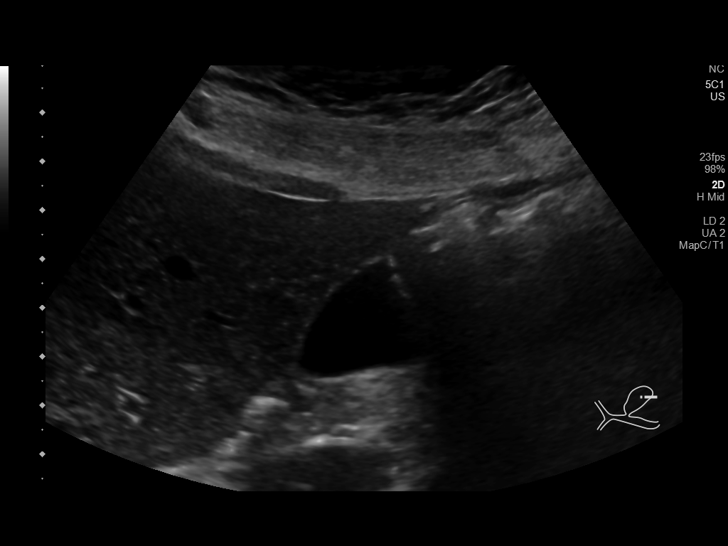
[im 14/37]
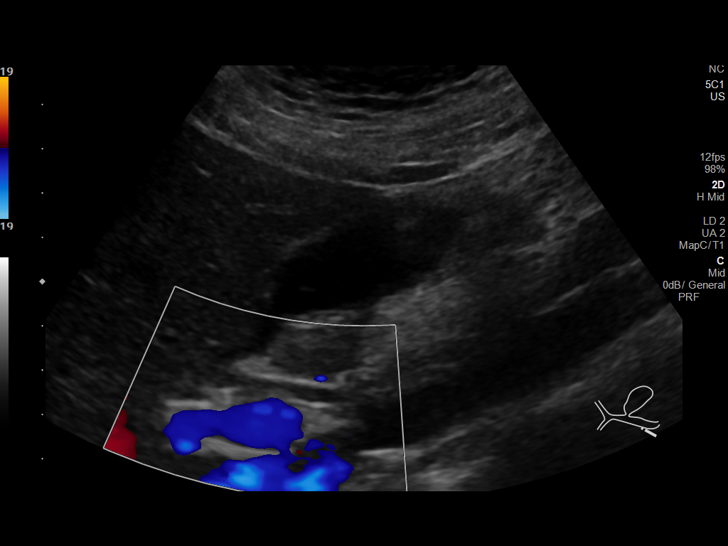
[im 17/37]
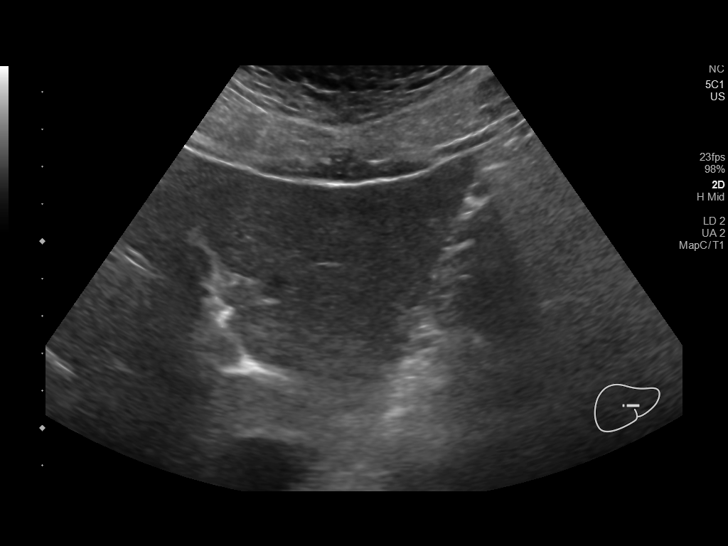
[im 20/37]
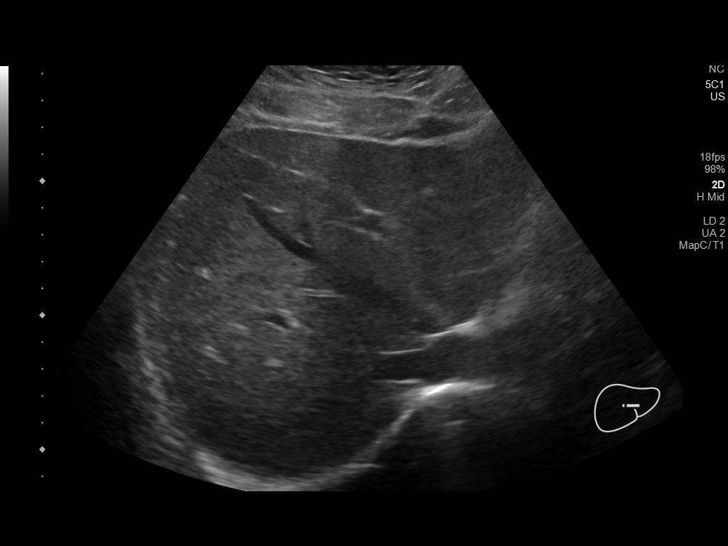
[im 23/37]
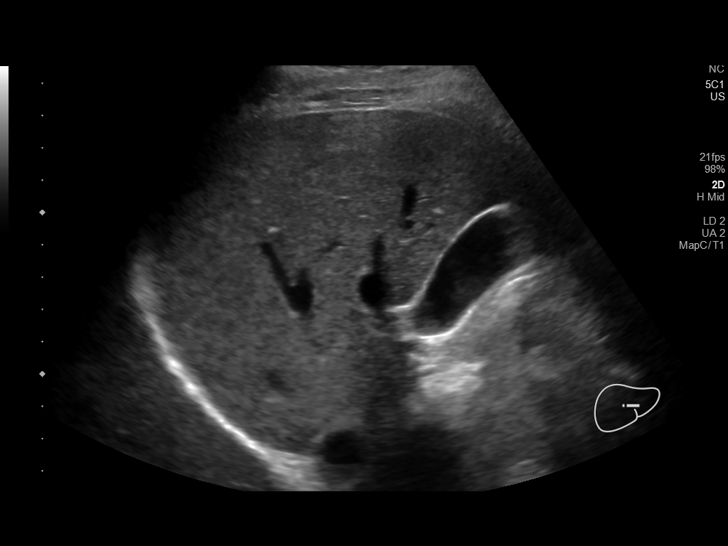
[im 25/37]
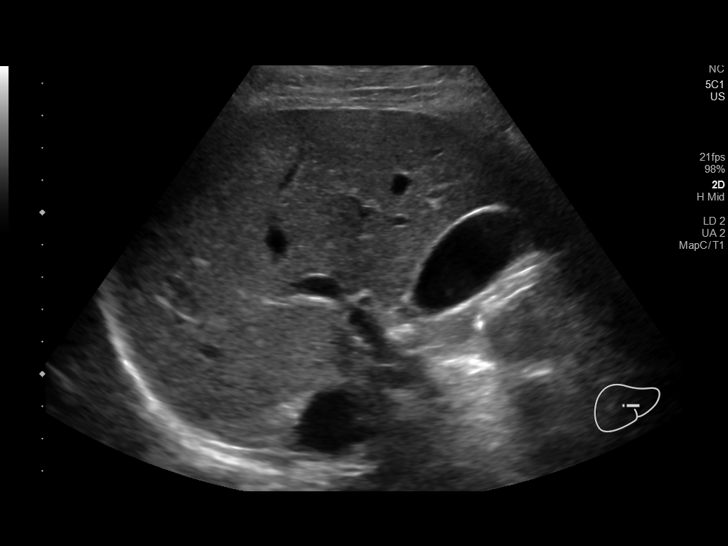
[im 28/37]
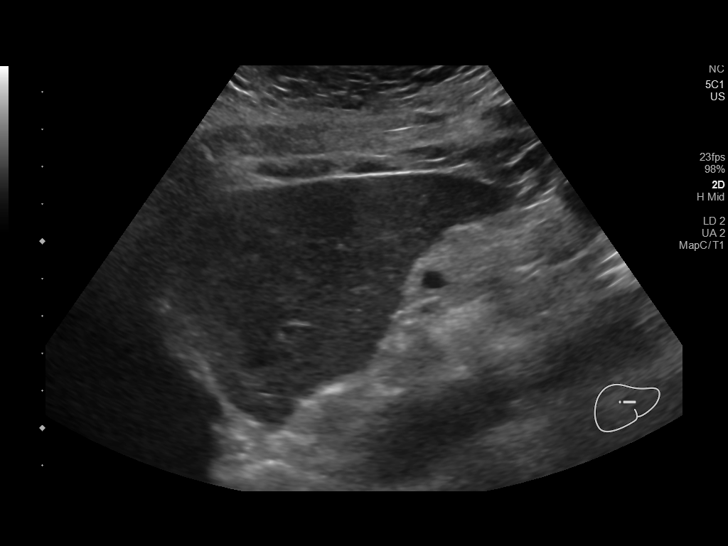
[im 31/37]
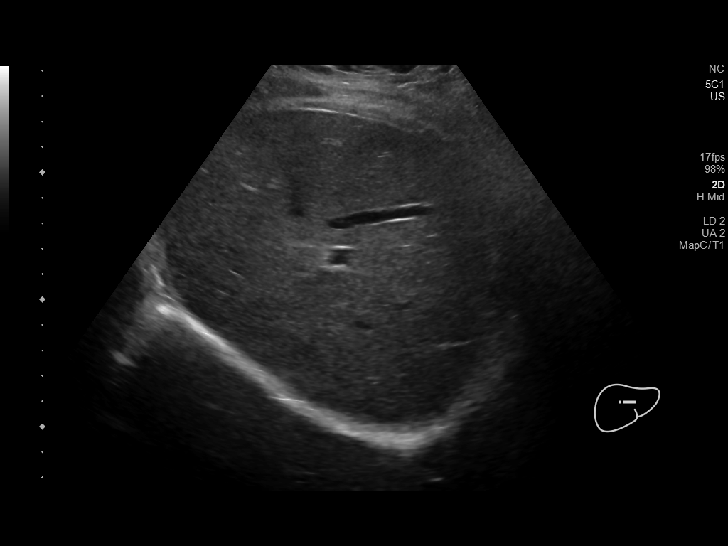
[im 34/37]
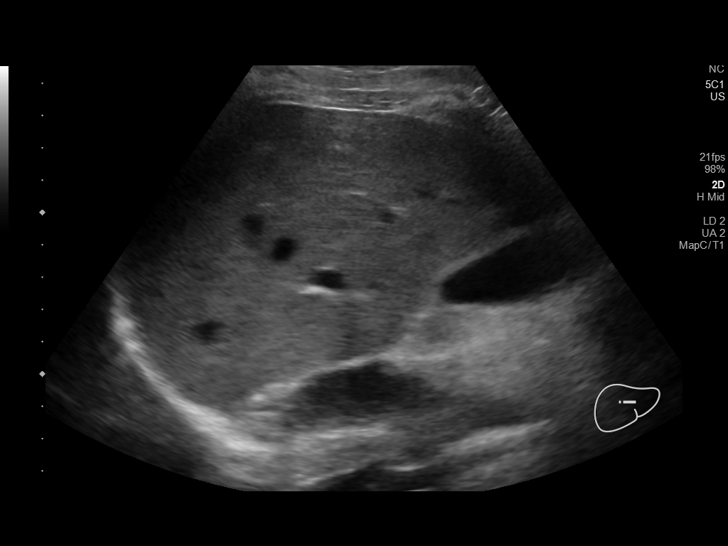
[im 37/37]
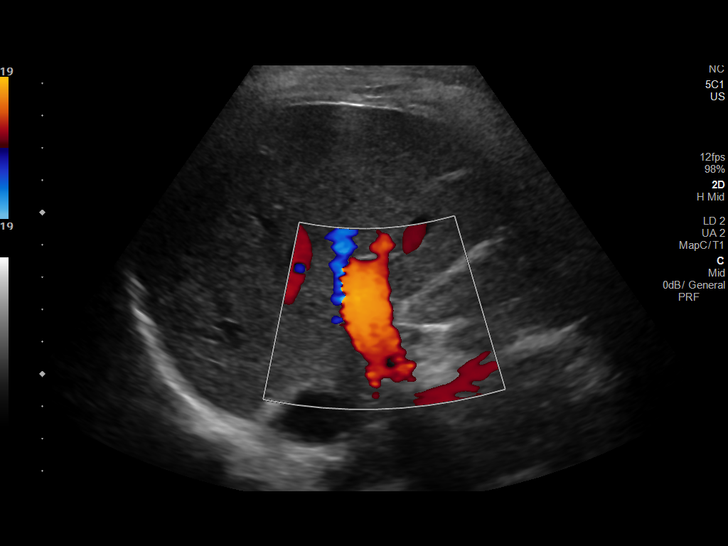

[14 of 25 positions shown; findings below may reference images not displayed]

FINDINGS: Gallbladder:

No gallstones or wall thickening visualized. There is no
pericholecystic fluid. No sonographic Murphy sign noted by
sonographer.

Common bile duct:

Diameter: 4 mm. No intrahepatic or extrahepatic biliary duct
dilatation.

Liver:

No focal lesion identified. Within normal limits in parenchymal
echogenicity. Portal vein is patent on color Doppler imaging with
normal direction of blood flow towards the liver.
IMPRESSION: Study within normal limits.

## 2019-04-15 MED ORDER — POTASSIUM CHLORIDE CRYS ER 20 MEQ PO TBCR
40.0000 meq | EXTENDED_RELEASE_TABLET | Freq: Once | ORAL | Status: AC
  Administered 2019-04-15: 40 meq via ORAL
  Filled 2019-04-15: qty 2

## 2019-04-15 MED ORDER — SODIUM CHLORIDE 0.9 % IV BOLUS
1000.0000 mL | Freq: Once | INTRAVENOUS | Status: AC
  Administered 2019-04-15: 1000 mL via INTRAVENOUS

## 2019-04-15 MED ORDER — FENTANYL CITRATE (PF) 100 MCG/2ML IJ SOLN
50.0000 ug | Freq: Once | INTRAMUSCULAR | Status: AC
  Administered 2019-04-15: 50 ug via INTRAVENOUS
  Filled 2019-04-15: qty 2

## 2019-04-15 MED ORDER — ONDANSETRON HCL 4 MG/2ML IJ SOLN
4.0000 mg | Freq: Once | INTRAMUSCULAR | Status: AC
  Administered 2019-04-15: 4 mg via INTRAVENOUS
  Filled 2019-04-15: qty 2

## 2019-04-15 MED ORDER — ONDANSETRON 4 MG PO TBDP: 4.0000 mg | ORAL_TABLET | Freq: Three times a day (TID) | ORAL | 0 refills | Status: DC | PRN

## 2019-04-15 NOTE — ED Triage Notes (Signed)
Pt reports that she was seen on Saturday for the same thing. Pt reports nausea, vomiting for three days. Pt endorses some abd pain. Pt denies fevers, diarrhea. Pt denies drug or alcohol use.
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
## 2019-04-15 NOTE — ED Provider Notes (Signed)
  Physical Exam  BP 124/83 (BP Location: Left Arm)   Pulse (!) 53   Temp 98.9 F (37.2 C) (Oral)   Resp 18   Wt 68 kg   LMP 04/10/2019   SpO2 99%   BMI 24.96 kg/m   Physical Exam  ED Course/Procedures     Procedures  MDM  28 year old female with past medical history of IBS, cannabis dependence, orderly personality disorder presenting to the emergency department with abdominal pain, nausea vomiting.  Has not seen GI specialist in some time.  Was seen on the 16th of this month for similar with negative work-up.  Shift handed to me by about [place].  Work-up included CBC, CMP, pelvic ultrasound.  All of which are unremarkable other than a potassium of 3.0 which was supplemented with oral potassium here.  Patient's pain is controlled with fentanyl, fluids and antiemetic here in the emergency department.  Pending urinalysis and urine drug screen.  If these are normal patient will be sent home with GI follow-up. This may be THC hyperemesis vs IBS. Further workup and treatment can be co,mpleted by GI or primary care  Patient had no episodes of emesis antibiotics.  Patient reports that she is feeling much better and would like to go home.  Requests prescription for Zofran.  Advised to follow-up with GI specialist.     Gina Apley, PA-C 04/15/19 [place], [place], DO 04/20/19 1630

## 2019-04-18 ENCOUNTER — Encounter (HOSPITAL_COMMUNITY): Payer: Self-pay | Admitting: Emergency Medicine

## 2019-04-18 ENCOUNTER — Other Ambulatory Visit: Payer: Self-pay

## 2019-04-18 ENCOUNTER — Emergency Department (HOSPITAL_COMMUNITY)
Admission: EM | Admit: 2019-04-18 | Discharge: 2019-04-19 | Disposition: A | Discharge status: Home / Self Care | Payer: Self-pay | Attending: Emergency Medicine | Admitting: Emergency Medicine

## 2019-04-18 DIAGNOSIS — Z79899 Other long term (current) drug therapy: Secondary | ICD-10-CM | POA: Insufficient documentation

## 2019-04-18 DIAGNOSIS — F17210 Nicotine dependence, cigarettes, uncomplicated: Secondary | ICD-10-CM | POA: Insufficient documentation

## 2019-04-18 DIAGNOSIS — R112 Nausea with vomiting, unspecified: Secondary | ICD-10-CM | POA: Insufficient documentation

## 2019-04-18 LAB — CBC WITH DIFFERENTIAL/PLATELET
Abs Immature Granulocytes: 0.02 10*3/uL (ref 0.00–0.07)
Basophils Absolute: 0 10*3/uL (ref 0.0–0.1)
Basophils Relative: 0 %
Eosinophils Absolute: 0 10*3/uL (ref 0.0–0.5)
Eosinophils Relative: 0 %
HCT: 41.4 % (ref 36.0–46.0)
Hemoglobin: 14.1 g/dL (ref 12.0–15.0)
Immature Granulocytes: 0 %
Lymphocytes Relative: 21 %
Lymphs Abs: 1.9 10*3/uL (ref 0.7–4.0)
MCH: 30.7 pg (ref 26.0–34.0)
MCHC: 34.1 g/dL (ref 30.0–36.0)
MCV: 90 fL (ref 80.0–100.0)
Monocytes Absolute: 0.6 10*3/uL (ref 0.1–1.0)
Monocytes Relative: 7 %
Neutro Abs: 6.5 10*3/uL (ref 1.7–7.7)
Neutrophils Relative %: 72 %
Platelets: 274 10*3/uL (ref 150–400)
RBC: 4.6 MIL/uL (ref 3.87–5.11)
RDW: 12.3 % (ref 11.5–15.5)
WBC: 9.1 10*3/uL (ref 4.0–10.5)
nRBC: 0 % (ref 0.0–0.2)

## 2019-04-18 LAB — COMPREHENSIVE METABOLIC PANEL
ALT: 11 U/L (ref 0–44)
AST: 16 U/L (ref 15–41)
Albumin: 4.9 g/dL (ref 3.5–5.0)
Alkaline Phosphatase: 87 U/L (ref 38–126)
Anion gap: 10 (ref 5–15)
BUN: 9 mg/dL (ref 6–20)
CO2: 28 mmol/L (ref 22–32)
Calcium: 9.5 mg/dL (ref 8.9–10.3)
Chloride: 102 mmol/L (ref 98–111)
Creatinine, Ser: 0.95 mg/dL (ref 0.44–1.00)
GFR calc Af Amer: >60 mL/min (ref >60)
GFR calc non Af Amer: >60 mL/min (ref >60)
Glucose, Bld: 104 mg/dL — ABNORMAL HIGH (ref 70–99)
Potassium: 3.3 mmol/L — ABNORMAL LOW (ref 3.5–5.1)
Sodium: 140 mmol/L (ref 135–145)
Total Bilirubin: 0.8 mg/dL (ref 0.3–1.2)
Total Protein: 7.9 g/dL (ref 6.5–8.1)

## 2019-04-18 LAB — URINALYSIS, ROUTINE W REFLEX MICROSCOPIC
Bilirubin Urine: NEGATIVE
Glucose, UA: NEGATIVE mg/dL
Ketones, ur: 20 mg/dL — AB
Leukocytes,Ua: NEGATIVE
Nitrite: NEGATIVE
Protein, ur: NEGATIVE mg/dL
Specific Gravity, Urine: 1.011 (ref 1.005–1.030)
pH: 9 — ABNORMAL HIGH (ref 5.0–8.0)

## 2019-04-18 LAB — I-STAT BETA HCG BLOOD, ED (MC, WL, AP ONLY): I-stat hCG, quantitative: <5 m[IU]/mL (ref <5)

## 2019-04-18 LAB — LIPASE, BLOOD: Lipase: 22 U/L (ref 11–51)

## 2019-04-18 MED ORDER — SODIUM CHLORIDE 0.9 % IV BOLUS
1000.0000 mL | Freq: Once | INTRAVENOUS | Status: AC
  Administered 2019-04-18: 1000 mL via INTRAVENOUS

## 2019-04-18 MED ORDER — HALOPERIDOL LACTATE 5 MG/ML IJ SOLN
4.0000 mg | Freq: Once | INTRAMUSCULAR | Status: AC
  Administered 2019-04-18: 4 mg via INTRAVENOUS
  Filled 2019-04-18: qty 1

## 2019-04-18 MED ORDER — METOCLOPRAMIDE HCL 5 MG/ML IJ SOLN
10.0000 mg | Freq: Once | INTRAMUSCULAR | Status: AC
  Administered 2019-04-18: 10 mg via INTRAVENOUS
  Filled 2019-04-18: qty 2

## 2019-04-18 MED ORDER — POTASSIUM CHLORIDE CRYS ER 20 MEQ PO TBCR
40.0000 meq | EXTENDED_RELEASE_TABLET | Freq: Once | ORAL | Status: AC
  Administered 2019-04-18: 40 meq via ORAL
  Filled 2019-04-18: qty 2

## 2019-04-18 MED ORDER — CAPSAICIN 0.075 % EX CREA
TOPICAL_CREAM | Freq: Two times a day (BID) | CUTANEOUS | Status: DC
  Filled 2019-04-18: qty 60
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
## 2019-04-18 NOTE — ED Triage Notes (Signed)
Patient c/o abdominal pain with N/V x1 week. Reports seen for same x2. Denies diarrhea.

## 2019-04-18 NOTE — ED Notes (Signed)
Urine and culture collected and sent to lab.

## 2019-04-19 MED ORDER — PROMETHAZINE HCL 25 MG PO TABS: 25.0000 mg | ORAL_TABLET | Freq: Four times a day (QID) | ORAL | 0 refills | Status: DC | PRN

## 2019-04-19 MED ORDER — CAPSAICIN 0.025 % EX CREA: TOPICAL_CREAM | Freq: Two times a day (BID) | CUTANEOUS | 0 refills | Status: DC | PRN

## 2019-04-19 MED ORDER — PROMETHAZINE HCL 25 MG RE SUPP: 25.0000 mg | Freq: Four times a day (QID) | RECTAL | 0 refills | Status: DC | PRN

## 2019-04-19 NOTE — ED Notes (Signed)
Pt able to tolerate fluids with no reports of emesis. Pt alert and ambulatory

## 2019-11-05 ENCOUNTER — Other Ambulatory Visit: Payer: Self-pay

## 2019-11-05 ENCOUNTER — Emergency Department (HOSPITAL_COMMUNITY)
Admission: EM | Admit: 2019-11-05 | Discharge: 2019-11-05 | Disposition: A | Discharge status: Home / Self Care | Payer: Self-pay | Attending: Emergency Medicine | Admitting: Emergency Medicine

## 2019-11-05 DIAGNOSIS — Z5321 Procedure and treatment not carried out due to patient leaving prior to being seen by health care provider: Secondary | ICD-10-CM | POA: Insufficient documentation

## 2019-11-05 DIAGNOSIS — R109 Unspecified abdominal pain: Secondary | ICD-10-CM | POA: Insufficient documentation

## 2019-11-05 NOTE — ED Triage Notes (Signed)
Per [facility], patient has abd pain in LUQ started 0500 today. Patient reported NVD to EMS. Patient tried phenergan suppository today, now just has residual nausea. Patient reports this is recurrent issue.

## 2019-11-08 ENCOUNTER — Emergency Department (HOSPITAL_COMMUNITY)
Admission: EM | Admit: 2019-11-08 | Discharge: 2019-11-08 | Discharge status: Against Medical Advice | Payer: Self-pay | Attending: Emergency Medicine | Admitting: Emergency Medicine

## 2019-11-08 ENCOUNTER — Other Ambulatory Visit: Payer: Self-pay

## 2019-11-08 ENCOUNTER — Encounter (HOSPITAL_COMMUNITY): Payer: Self-pay

## 2019-11-08 DIAGNOSIS — Z5321 Procedure and treatment not carried out due to patient leaving prior to being seen by health care provider: Secondary | ICD-10-CM | POA: Insufficient documentation

## 2019-11-08 MED ORDER — SODIUM CHLORIDE 0.9% FLUSH: 3.0000 mL | Freq: Once | INTRAVENOUS | Status: DC

## 2019-11-08 NOTE — ED Notes (Signed)
Pt got up out of triage room as we are about to room her and states, "I just want to go home." She ambulated out of the ED with a steady gait.

## 2019-11-08 NOTE — ED Triage Notes (Signed)
Pt reports generalized abdominal pain x3 days. She states that she has Crohn's Disease (not listed in her history) and has been vomiting for 3 days as well. She states that she cannot keep anything down. Pt is tearful in triage.

## 2019-12-29 ENCOUNTER — Encounter (HOSPITAL_COMMUNITY): Payer: Self-pay | Admitting: Emergency Medicine

## 2019-12-29 ENCOUNTER — Other Ambulatory Visit: Payer: Self-pay

## 2019-12-29 ENCOUNTER — Emergency Department (HOSPITAL_COMMUNITY)
Admission: EM | Admit: 2019-12-29 | Discharge: 2019-12-29 | Disposition: A | Discharge status: Home / Self Care | Payer: Self-pay | Attending: Emergency Medicine | Admitting: Emergency Medicine

## 2019-12-29 DIAGNOSIS — F17210 Nicotine dependence, cigarettes, uncomplicated: Secondary | ICD-10-CM | POA: Insufficient documentation

## 2019-12-29 DIAGNOSIS — Z91040 Latex allergy status: Secondary | ICD-10-CM | POA: Insufficient documentation

## 2019-12-29 DIAGNOSIS — R109 Unspecified abdominal pain: Secondary | ICD-10-CM

## 2019-12-29 DIAGNOSIS — R1033 Periumbilical pain: Secondary | ICD-10-CM | POA: Insufficient documentation

## 2019-12-29 DIAGNOSIS — R197 Diarrhea, unspecified: Secondary | ICD-10-CM | POA: Insufficient documentation

## 2019-12-29 DIAGNOSIS — R112 Nausea with vomiting, unspecified: Secondary | ICD-10-CM | POA: Insufficient documentation

## 2019-12-29 LAB — COMPREHENSIVE METABOLIC PANEL
ALT: 12 U/L (ref 0–44)
AST: 14 U/L — ABNORMAL LOW (ref 15–41)
Albumin: 4.5 g/dL (ref 3.5–5.0)
Alkaline Phosphatase: 71 U/L (ref 38–126)
Anion gap: 10 (ref 5–15)
BUN: 8 mg/dL (ref 6–20)
CO2: 27 mmol/L (ref 22–32)
Calcium: 9.5 mg/dL (ref 8.9–10.3)
Chloride: 101 mmol/L (ref 98–111)
Creatinine, Ser: 0.82 mg/dL (ref 0.44–1.00)
GFR calc Af Amer: >60 mL/min (ref >60)
GFR calc non Af Amer: >60 mL/min (ref >60)
Glucose, Bld: 108 mg/dL — ABNORMAL HIGH (ref 70–99)
Potassium: 3.3 mmol/L — ABNORMAL LOW (ref 3.5–5.1)
Sodium: 138 mmol/L (ref 135–145)
Total Bilirubin: 0.5 mg/dL (ref 0.3–1.2)
Total Protein: 7.5 g/dL (ref 6.5–8.1)

## 2019-12-29 LAB — CBC
HCT: 40.9 % (ref 36.0–46.0)
Hemoglobin: 14 g/dL (ref 12.0–15.0)
MCH: 31 pg (ref 26.0–34.0)
MCHC: 34.2 g/dL (ref 30.0–36.0)
MCV: 90.7 fL (ref 80.0–100.0)
Platelets: 178 10*3/uL (ref 150–400)
RBC: 4.51 MIL/uL (ref 3.87–5.11)
RDW: 12.6 % (ref 11.5–15.5)
WBC: 6.6 10*3/uL (ref 4.0–10.5)
nRBC: 0 % (ref 0.0–0.2)

## 2019-12-29 LAB — I-STAT BETA HCG BLOOD, ED (MC, WL, AP ONLY): I-stat hCG, quantitative: <5 m[IU]/mL (ref <5)

## 2019-12-29 LAB — LIPASE, BLOOD: Lipase: 18 U/L (ref 11–51)

## 2019-12-29 MED ORDER — SODIUM CHLORIDE 0.9% FLUSH: 3.0000 mL | Freq: Once | INTRAVENOUS | Status: DC

## 2019-12-29 MED ORDER — SODIUM CHLORIDE 0.9 % IV BOLUS
1000.0000 mL | Freq: Once | INTRAVENOUS | Status: AC
  Administered 2019-12-29: 1000 mL via INTRAVENOUS

## 2019-12-29 MED ORDER — PROCHLORPERAZINE EDISYLATE 10 MG/2ML IJ SOLN
10.0000 mg | Freq: Once | INTRAMUSCULAR | Status: AC
  Administered 2019-12-29: 10 mg via INTRAVENOUS
  Filled 2019-12-29: qty 2

## 2019-12-29 MED ORDER — POTASSIUM CHLORIDE CRYS ER 20 MEQ PO TBCR
40.0000 meq | EXTENDED_RELEASE_TABLET | Freq: Once | ORAL | Status: AC
  Administered 2019-12-29: 40 meq via ORAL
  Filled 2019-12-29: qty 2

## 2019-12-29 MED ORDER — FAMOTIDINE IN NACL 20-0.9 MG/50ML-% IV SOLN
20.0000 mg | Freq: Once | INTRAVENOUS | Status: AC
  Administered 2019-12-29: 20 mg via INTRAVENOUS
  Filled 2019-12-29: qty 50

## 2019-12-29 MED ORDER — DICYCLOMINE HCL 10 MG/ML IM SOLN
20.0000 mg | Freq: Once | INTRAMUSCULAR | Status: AC
  Administered 2019-12-29: 20 mg via INTRAMUSCULAR
  Filled 2019-12-29: qty 2

## 2019-12-29 NOTE — ED Triage Notes (Signed)
Patient here from home with complaints of abd pain, n/v that started 4 days ago. Hx of same.

## 2019-12-29 NOTE — ED Notes (Signed)
Pt denied being able to void at this time, will monitor.
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
## 2019-12-29 NOTE — ED Notes (Signed)
She was able to take her K+ by mouth with ginger ale. She is in no distress and tells me she feels "a little better".

## 2019-12-29 NOTE — ED Notes (Signed)
I was surprised to pass her in the hall. She announced that "I have to go, thank you for taking care of me". She then proceeded to ambulate to w.r. she assured me her IV was out and not bleeding. P.A. Cortni notified.

## 2020-02-17 ENCOUNTER — Emergency Department (HOSPITAL_COMMUNITY)
Admission: EM | Admit: 2020-02-17 | Discharge: 2020-02-17 | Disposition: A | Discharge status: Home / Self Care | Payer: Self-pay | Attending: Emergency Medicine | Admitting: Emergency Medicine

## 2020-02-17 ENCOUNTER — Other Ambulatory Visit: Payer: Self-pay

## 2020-02-17 ENCOUNTER — Encounter (HOSPITAL_COMMUNITY): Payer: Self-pay | Admitting: Emergency Medicine

## 2020-02-17 DIAGNOSIS — Y929 Unspecified place or not applicable: Secondary | ICD-10-CM | POA: Insufficient documentation

## 2020-02-17 DIAGNOSIS — Y9389 Activity, other specified: Secondary | ICD-10-CM | POA: Insufficient documentation

## 2020-02-17 DIAGNOSIS — Z91040 Latex allergy status: Secondary | ICD-10-CM | POA: Insufficient documentation

## 2020-02-17 DIAGNOSIS — S51852A Open bite of left forearm, initial encounter: Secondary | ICD-10-CM | POA: Insufficient documentation

## 2020-02-17 DIAGNOSIS — L089 Local infection of the skin and subcutaneous tissue, unspecified: Secondary | ICD-10-CM | POA: Insufficient documentation

## 2020-02-17 DIAGNOSIS — Y999 Unspecified external cause status: Secondary | ICD-10-CM | POA: Insufficient documentation

## 2020-02-17 DIAGNOSIS — F17210 Nicotine dependence, cigarettes, uncomplicated: Secondary | ICD-10-CM | POA: Insufficient documentation

## 2020-02-17 DIAGNOSIS — W5501XA Bitten by cat, initial encounter: Secondary | ICD-10-CM | POA: Insufficient documentation

## 2020-02-17 DIAGNOSIS — S61452A Open bite of left hand, initial encounter: Secondary | ICD-10-CM | POA: Insufficient documentation

## 2020-02-17 LAB — COMPREHENSIVE METABOLIC PANEL
ALT: 16 U/L (ref 0–44)
AST: 17 U/L (ref 15–41)
Albumin: 4.1 g/dL (ref 3.5–5.0)
Alkaline Phosphatase: 87 U/L (ref 38–126)
Anion gap: 7 (ref 5–15)
BUN: 8 mg/dL (ref 6–20)
CO2: 28 mmol/L (ref 22–32)
Calcium: 9.5 mg/dL (ref 8.9–10.3)
Chloride: 102 mmol/L (ref 98–111)
Creatinine, Ser: 0.78 mg/dL (ref 0.44–1.00)
GFR calc Af Amer: >60 mL/min (ref >60)
GFR calc non Af Amer: >60 mL/min (ref >60)
Glucose, Bld: 91 mg/dL (ref 70–99)
Potassium: 4.1 mmol/L (ref 3.5–5.1)
Sodium: 137 mmol/L (ref 135–145)
Total Bilirubin: 0.4 mg/dL (ref 0.3–1.2)
Total Protein: 7.3 g/dL (ref 6.5–8.1)

## 2020-02-17 LAB — URINALYSIS, ROUTINE W REFLEX MICROSCOPIC
Bilirubin Urine: NEGATIVE
Glucose, UA: NEGATIVE mg/dL
Hgb urine dipstick: NEGATIVE
Ketones, ur: NEGATIVE mg/dL
Leukocytes,Ua: NEGATIVE
Nitrite: NEGATIVE
Protein, ur: NEGATIVE mg/dL
Specific Gravity, Urine: 1.004 — ABNORMAL LOW (ref 1.005–1.030)
pH: 7 (ref 5.0–8.0)

## 2020-02-17 LAB — CBC WITH DIFFERENTIAL/PLATELET
Abs Immature Granulocytes: 0.03 10*3/uL (ref 0.00–0.07)
Basophils Absolute: 0 10*3/uL (ref 0.0–0.1)
Basophils Relative: 0 %
Eosinophils Absolute: 0.2 10*3/uL (ref 0.0–0.5)
Eosinophils Relative: 2 %
HCT: 41.4 % (ref 36.0–46.0)
Hemoglobin: 13.1 g/dL (ref 12.0–15.0)
Immature Granulocytes: 0 %
Lymphocytes Relative: 26 %
Lymphs Abs: 2.5 10*3/uL (ref 0.7–4.0)
MCH: 30.6 pg (ref 26.0–34.0)
MCHC: 31.6 g/dL (ref 30.0–36.0)
MCV: 96.7 fL (ref 80.0–100.0)
Monocytes Absolute: 0.6 10*3/uL (ref 0.1–1.0)
Monocytes Relative: 6 %
Neutro Abs: 6.2 10*3/uL (ref 1.7–7.7)
Neutrophils Relative %: 66 %
Platelets: 224 10*3/uL (ref 150–400)
RBC: 4.28 MIL/uL (ref 3.87–5.11)
RDW: 13.1 % (ref 11.5–15.5)
WBC: 9.5 10*3/uL (ref 4.0–10.5)
nRBC: 0 % (ref 0.0–0.2)

## 2020-02-17 LAB — LACTIC ACID, PLASMA: Lactic Acid, Venous: 1 mmol/L (ref 0.5–1.9)

## 2020-02-17 LAB — I-STAT BETA HCG BLOOD, ED (MC, WL, AP ONLY): I-stat hCG, quantitative: <5 m[IU]/mL (ref <5)

## 2020-02-17 MED ORDER — SODIUM CHLORIDE 0.9 % IV SOLN
3.0000 g | Freq: Once | INTRAVENOUS | Status: AC
  Administered 2020-02-17: 3 g via INTRAVENOUS
  Filled 2020-02-17: qty 8

## 2020-02-17 MED ORDER — AMOXICILLIN-POT CLAVULANATE 875-125 MG PO TABS: 1.0000 | ORAL_TABLET | Freq: Two times a day (BID) | ORAL | 0 refills | Status: DC

## 2020-02-17 NOTE — ED Notes (Signed)
Labeled urine and culture specimen sent to lab. [facility]

## 2020-02-17 NOTE — ED Triage Notes (Signed)
Pt bitten by own cat 2 days ago. Reports attempting to clean it with peroxide but the pain and swelling worsened. Reports the cat is vaccinated. Notable puncture wounds, redness and swelling in the area. Puncture wounds on left forearm and top of left hand.

## 2020-02-17 NOTE — ED Notes (Signed)
Animal Bite information sheet faxed to [employer] by [employer].
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
## 2020-02-17 NOTE — Discharge Instructions (Addendum)
Return here as needed for any worsening in your condition.  Monitor your condition closely.  Follow-up with your primary doctor for recheck as well.  If your condition changes such as increasing pain especially with range of motion of the wrist and hand, increased swelling and redness or fever you will need to be reassessed here in the emergency department.  Tylenol and Motrin for any discomfort.

## 2020-04-17 ENCOUNTER — Emergency Department (HOSPITAL_COMMUNITY)
Admission: EM | Admit: 2020-04-17 | Discharge: 2020-04-17 | Disposition: A | Discharge status: Home / Self Care | Payer: Self-pay | Attending: Emergency Medicine | Admitting: Emergency Medicine

## 2020-04-17 ENCOUNTER — Other Ambulatory Visit: Payer: Self-pay

## 2020-04-17 ENCOUNTER — Encounter (HOSPITAL_COMMUNITY): Payer: Self-pay | Admitting: Emergency Medicine

## 2020-04-17 DIAGNOSIS — M549 Dorsalgia, unspecified: Secondary | ICD-10-CM | POA: Insufficient documentation

## 2020-04-17 DIAGNOSIS — Z5321 Procedure and treatment not carried out due to patient leaving prior to being seen by health care provider: Secondary | ICD-10-CM | POA: Insufficient documentation

## 2020-04-17 DIAGNOSIS — M542 Cervicalgia: Secondary | ICD-10-CM | POA: Insufficient documentation

## 2020-04-17 NOTE — ED Triage Notes (Signed)
Patient states neck and back pain since waking up today. Patient states she was hurting yesterday, so she saw the chiropractor for an adjustment. Patient states pain was better afterward, but today worsening

## 2020-07-27 ENCOUNTER — Emergency Department (HOSPITAL_COMMUNITY)
Admission: EM | Admit: 2020-07-27 | Discharge: 2020-07-27 | Disposition: A | Discharge status: Home / Self Care | Payer: Self-pay | Attending: Emergency Medicine | Admitting: Emergency Medicine

## 2020-07-27 ENCOUNTER — Other Ambulatory Visit: Payer: Self-pay

## 2020-07-27 ENCOUNTER — Encounter (HOSPITAL_COMMUNITY): Payer: Self-pay

## 2020-07-27 DIAGNOSIS — R1110 Vomiting, unspecified: Secondary | ICD-10-CM | POA: Insufficient documentation

## 2020-07-27 DIAGNOSIS — R1030 Lower abdominal pain, unspecified: Secondary | ICD-10-CM | POA: Insufficient documentation

## 2020-07-27 DIAGNOSIS — Z5321 Procedure and treatment not carried out due to patient leaving prior to being seen by health care provider: Secondary | ICD-10-CM | POA: Insufficient documentation

## 2020-07-27 LAB — COMPREHENSIVE METABOLIC PANEL
ALT: 16 U/L (ref 0–44)
AST: 18 U/L (ref 15–41)
Albumin: 4.7 g/dL (ref 3.5–5.0)
Alkaline Phosphatase: 66 U/L (ref 38–126)
Anion gap: 11 (ref 5–15)
BUN: 14 mg/dL (ref 6–20)
CO2: 28 mmol/L (ref 22–32)
Calcium: 9.6 mg/dL (ref 8.9–10.3)
Chloride: 100 mmol/L (ref 98–111)
Creatinine, Ser: 0.94 mg/dL (ref 0.44–1.00)
GFR calc Af Amer: >60 mL/min (ref >60)
GFR calc non Af Amer: >60 mL/min (ref >60)
Glucose, Bld: 102 mg/dL — ABNORMAL HIGH (ref 70–99)
Potassium: 3.3 mmol/L — ABNORMAL LOW (ref 3.5–5.1)
Sodium: 139 mmol/L (ref 135–145)
Total Bilirubin: 0.8 mg/dL (ref 0.3–1.2)
Total Protein: 7.8 g/dL (ref 6.5–8.1)

## 2020-07-27 LAB — CBC
HCT: 42.5 % (ref 36.0–46.0)
Hemoglobin: 14.4 g/dL (ref 12.0–15.0)
MCH: 31 pg (ref 26.0–34.0)
MCHC: 33.9 g/dL (ref 30.0–36.0)
MCV: 91.6 fL (ref 80.0–100.0)
Platelets: 197 10*3/uL (ref 150–400)
RBC: 4.64 MIL/uL (ref 3.87–5.11)
RDW: 12.8 % (ref 11.5–15.5)
WBC: 10.5 10*3/uL (ref 4.0–10.5)
nRBC: 0 % (ref 0.0–0.2)

## 2020-07-27 LAB — LIPASE, BLOOD: Lipase: 34 U/L (ref 11–51)

## 2020-07-27 LAB — I-STAT BETA HCG BLOOD, ED (MC, WL, AP ONLY): I-stat hCG, quantitative: <5 m[IU]/mL (ref <5)

## 2020-07-27 NOTE — ED Triage Notes (Signed)
Pt reports lower abdominal pain that started 2 days ago. Reports that she started her period last night, but states that the pain is different and is not crampy. Endorses 10 episodes of vomiting since the pain started.

## 2020-11-23 ENCOUNTER — Encounter (HOSPITAL_BASED_OUTPATIENT_CLINIC_OR_DEPARTMENT_OTHER): Payer: Self-pay | Admitting: *Deleted

## 2020-11-23 ENCOUNTER — Other Ambulatory Visit: Payer: Self-pay

## 2020-11-23 DIAGNOSIS — R197 Diarrhea, unspecified: Secondary | ICD-10-CM | POA: Insufficient documentation

## 2020-11-23 DIAGNOSIS — Z5321 Procedure and treatment not carried out due to patient leaving prior to being seen by health care provider: Secondary | ICD-10-CM | POA: Insufficient documentation

## 2020-11-23 DIAGNOSIS — R112 Nausea with vomiting, unspecified: Secondary | ICD-10-CM | POA: Insufficient documentation

## 2020-11-23 LAB — URINALYSIS, ROUTINE W REFLEX MICROSCOPIC
Bilirubin Urine: NEGATIVE
Glucose, UA: NEGATIVE mg/dL
Hgb urine dipstick: NEGATIVE
Ketones, ur: 80 mg/dL — AB
Leukocytes,Ua: NEGATIVE
Nitrite: NEGATIVE
Protein, ur: 30 mg/dL — AB
Specific Gravity, Urine: 1.01 (ref 1.005–1.030)
pH: 8 (ref 5.0–8.0)

## 2020-11-23 LAB — PREGNANCY, URINE: Preg Test, Ur: NEGATIVE

## 2020-11-23 LAB — URINALYSIS, MICROSCOPIC (REFLEX): WBC, UA: NONE SEEN WBC/hpf (ref 0–5)

## 2020-11-23 MED ORDER — ONDANSETRON 4 MG PO TBDP
4.0000 mg | ORAL_TABLET | Freq: Once | ORAL | Status: AC
  Administered 2020-11-23: 20:00:00 4 mg via ORAL

## 2020-11-23 NOTE — ED Notes (Signed)
Pt not in lobby or right outside lobby doors for vital sign recheck.

## 2020-11-23 NOTE — ED Notes (Signed)
No answer x 1 when called for triage

## 2020-11-23 NOTE — ED Triage Notes (Signed)
N/V/D x 1 day. Hx of same. Reports that this occurs every month and that she ran out of zofran this time. Last vomit 1 hour PTA

## 2020-11-23 NOTE — ED Notes (Signed)
Pt has now returned to ED lobby

## 2020-11-24 ENCOUNTER — Emergency Department (HOSPITAL_BASED_OUTPATIENT_CLINIC_OR_DEPARTMENT_OTHER)
Admission: EM | Admit: 2020-11-24 | Discharge: 2020-11-24 | Disposition: A | Discharge status: Home / Self Care | Payer: Self-pay | Attending: Emergency Medicine | Admitting: Emergency Medicine

## 2020-11-24 NOTE — ED Notes (Signed)
Called for room, no answer. Pt not visualized in lobby or outside.

## 2021-01-12 ENCOUNTER — Other Ambulatory Visit: Payer: Self-pay

## 2021-01-12 ENCOUNTER — Encounter: Payer: Self-pay | Admitting: Nurse Practitioner

## 2021-01-12 ENCOUNTER — Other Ambulatory Visit (INDEPENDENT_AMBULATORY_CARE_PROVIDER_SITE_OTHER): Payer: 59

## 2021-01-12 ENCOUNTER — Ambulatory Visit (INDEPENDENT_AMBULATORY_CARE_PROVIDER_SITE_OTHER): Payer: 59 | Admitting: Nurse Practitioner

## 2021-01-12 VITALS — BP 138/84 | HR 72 | Ht 64.0 in | Wt 127.0 lb

## 2021-01-12 DIAGNOSIS — K219 Gastro-esophageal reflux disease without esophagitis: Secondary | ICD-10-CM

## 2021-01-12 DIAGNOSIS — R112 Nausea with vomiting, unspecified: Secondary | ICD-10-CM

## 2021-01-12 DIAGNOSIS — R1010 Upper abdominal pain, unspecified: Secondary | ICD-10-CM

## 2021-01-12 DIAGNOSIS — R197 Diarrhea, unspecified: Secondary | ICD-10-CM

## 2021-01-12 DIAGNOSIS — R1030 Lower abdominal pain, unspecified: Secondary | ICD-10-CM

## 2021-01-12 LAB — CBC WITH DIFFERENTIAL/PLATELET
Basophils Absolute: 0 10*3/uL (ref 0.0–0.1)
Basophils Relative: 0.2 % (ref 0.0–3.0)
Eosinophils Absolute: 0.2 10*3/uL (ref 0.0–0.7)
Eosinophils Relative: 1.9 % (ref 0.0–5.0)
HCT: 39.4 % (ref 36.0–46.0)
Hemoglobin: 13.3 g/dL (ref 12.0–15.0)
Lymphocytes Relative: 32.2 % (ref 12.0–46.0)
Lymphs Abs: 3.2 10*3/uL (ref 0.7–4.0)
MCHC: 33.7 g/dL (ref 30.0–36.0)
MCV: 92 fl (ref 78.0–100.0)
Monocytes Absolute: 0.5 10*3/uL (ref 0.1–1.0)
Monocytes Relative: 5.3 % (ref 3.0–12.0)
Neutro Abs: 5.9 10*3/uL (ref 1.4–7.7)
Neutrophils Relative %: 60.4 % (ref 43.0–77.0)
Platelets: 262 10*3/uL (ref 150.0–400.0)
RBC: 4.29 Mil/uL (ref 3.87–5.11)
RDW: 13.4 % (ref 11.5–15.5)
WBC: 9.8 10*3/uL (ref 4.0–10.5)

## 2021-01-12 LAB — COMPREHENSIVE METABOLIC PANEL
ALT: 12 U/L (ref 0–35)
AST: 12 U/L (ref 0–37)
Albumin: 4.1 g/dL (ref 3.5–5.2)
Alkaline Phosphatase: 63 U/L (ref 39–117)
BUN: 9 mg/dL (ref 6–23)
CO2: 30 mEq/L (ref 19–32)
Calcium: 9.8 mg/dL (ref 8.4–10.5)
Chloride: 102 mEq/L (ref 96–112)
Creatinine, Ser: 0.88 mg/dL (ref 0.40–1.20)
GFR: 88.16 mL/min (ref >60.00)
Glucose, Bld: 92 mg/dL (ref 70–99)
Potassium: 3.5 mEq/L (ref 3.5–5.1)
Sodium: 138 mEq/L (ref 135–145)
Total Bilirubin: 0.3 mg/dL (ref 0.2–1.2)
Total Protein: 7 g/dL (ref 6.0–8.3)

## 2021-01-12 LAB — TSH: TSH: 1.41 u[IU]/mL (ref 0.35–4.50)

## 2021-01-12 LAB — C-REACTIVE PROTEIN: CRP: <1 mg/dL (ref 0.5–20.0)

## 2021-01-12 MED ORDER — ONDANSETRON 4 MG PO TBDP: 4.0000 mg | ORAL_TABLET | Freq: Four times a day (QID) | ORAL | 1 refills | Status: DC | PRN

## 2021-01-12 MED ORDER — DICYCLOMINE HCL 10 MG PO CAPS: 10.0000 mg | ORAL_CAPSULE | Freq: Every day | ORAL | 1 refills | Status: DC | PRN

## 2021-01-12 MED ORDER — OMEPRAZOLE 20 MG PO CPDR: 20.0000 mg | DELAYED_RELEASE_CAPSULE | Freq: Two times a day (BID) | ORAL | 1 refills | Status: DC

## 2021-01-12 MED ORDER — PROMETHAZINE HCL 25 MG RE SUPP: 25.0000 mg | Freq: Every day | RECTAL | 0 refills | Status: DC | PRN
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
## 2021-01-12 NOTE — Patient Instructions (Signed)
If you are age 31 or younger, your body mass index should be between 19-25. Your Body mass index is 21.8 kg/m. If this is out of the aformentioned range listed, please consider follow up with your Primary Care Provider.   LABS:   Labwork has been ordered for you today.  Press "B" on the elevator. The lab is located at the first door on the left as you exit the elevator.  HEALTHCARE LAWS AND MY CHART RESULTS: Due to recent changes in healthcare laws, you may see the results of your imaging and laboratory studies on MyChart before your provider has had a chance to review them.   We understand that in some cases there may be results that are confusing or concerning to you. Not all laboratory results come back in the same time frame and the provider may be waiting for multiple results in order to interpret others.  Please give [place] 48 hours in order for your provider to thoroughly review all the results before contacting the office for clarification of your results.   PROCEDURES:  You have been scheduled for an endoscopy and colonoscopy. Please follow the written instructions given to you at your visit today. Please pick up your prep supplies at the pharmacy within the next 1-3 days. If you use inhalers (even only as needed), please bring them with you on the day of your procedure.  MEDICATION  We have sent the following medication to your pharmacy for you to pick up at your convenience:   Omeprazole 20 MG twice a day. Ondansetron 4 MG disintegrating tablet, place 1 tablet on tongue every 6-8 hours as needed for nausea/vomiting. Phenergan 25 MG rectal suppository, use one a day as needed for nausea/vomiting Dicyclomine 10 MG 4 times a day as needed for abdominal pain/cramping.  It was great seeing you today!  Thank you for entrusting me with your care and choosing [facility].  Noralyn Pick, CRNP

## 2021-01-13 DIAGNOSIS — R1030 Lower abdominal pain, unspecified: Secondary | ICD-10-CM | POA: Insufficient documentation

## 2021-01-13 DIAGNOSIS — R112 Nausea with vomiting, unspecified: Secondary | ICD-10-CM | POA: Insufficient documentation

## 2021-01-13 DIAGNOSIS — R197 Diarrhea, unspecified: Secondary | ICD-10-CM | POA: Insufficient documentation

## 2021-01-13 LAB — IGA: Immunoglobulin A: 175 mg/dL (ref 47–310)

## 2021-01-13 LAB — TISSUE TRANSGLUTAMINASE ABS,IGG,IGA
(tTG) Ab, IgA: <1 U/mL
(tTG) Ab, IgG: <1 U/mL

## 2021-01-22 NOTE — Progress Notes (Signed)
Reviewed and agree with management plans. Will work to for earlier endoscopic availability as I see her procedures are not scheduled until 03/02/21.   Kimberly L. Tarri Glenn, MD, MPH

## 2021-01-22 NOTE — Progress Notes (Signed)
Left message for pt to call back regarding moving appt to an earlier date.

## 2021-01-22 NOTE — Progress Notes (Signed)
Pt called back but the additional times that we had available were only 30 min slots and did not work with her schedule.

## 2021-02-11 ENCOUNTER — Encounter (HOSPITAL_COMMUNITY): Payer: Self-pay | Admitting: Emergency Medicine

## 2021-02-11 ENCOUNTER — Emergency Department (HOSPITAL_COMMUNITY)
Admission: EM | Admit: 2021-02-11 | Discharge: 2021-02-11 | Disposition: A | Discharge status: Home / Self Care | Payer: 59 | Attending: Emergency Medicine | Admitting: Emergency Medicine

## 2021-02-11 ENCOUNTER — Ambulatory Visit (HOSPITAL_COMMUNITY)
Admission: EM | Admit: 2021-02-11 | Discharge: 2021-02-11 | Disposition: A | Discharge status: Home / Self Care | Payer: 59 | Attending: Family Medicine | Admitting: Family Medicine

## 2021-02-11 ENCOUNTER — Other Ambulatory Visit: Payer: Self-pay

## 2021-02-11 ENCOUNTER — Encounter (HOSPITAL_COMMUNITY): Payer: Self-pay

## 2021-02-11 VITALS — BP 152/80 | HR 106 | Temp 98.0°F | Resp 18

## 2021-02-11 DIAGNOSIS — K644 Residual hemorrhoidal skin tags: Secondary | ICD-10-CM | POA: Diagnosis not present

## 2021-02-11 DIAGNOSIS — K649 Unspecified hemorrhoids: Secondary | ICD-10-CM | POA: Insufficient documentation

## 2021-02-11 DIAGNOSIS — Z5321 Procedure and treatment not carried out due to patient leaving prior to being seen by health care provider: Secondary | ICD-10-CM | POA: Insufficient documentation

## 2021-02-11 DIAGNOSIS — K6289 Other specified diseases of anus and rectum: Secondary | ICD-10-CM | POA: Diagnosis not present

## 2021-02-11 HISTORY — DX: Unspecified hemorrhoids: K64.9

## 2021-02-11 MED ORDER — HYDROCORTISONE (PERIANAL) 2.5 % EX CREA: 1.0000 "application " | TOPICAL_CREAM | Freq: Two times a day (BID) | CUTANEOUS | 1 refills | Status: DC

## 2021-02-11 NOTE — ED Triage Notes (Signed)
Per pt, states a history of hemorrhoids, states she thinks one of them "burst" last night-states she is unable to sit down-has a colonoscopy scheduled in May
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
## 2021-02-11 NOTE — Discharge Instructions (Signed)
Follow-up in the next few days with [facility] surgery

## 2021-02-11 NOTE — ED Triage Notes (Signed)
Patient presents to [facility] for assessment after she strained to have a bowel movement, severe pain after having one after standing up from the toilet.  Patient c/o "throbbing vein" to rectum, and a sitz bath last night helped for a brief moment.  Denies blood.  abnominal pain on right side.

## 2021-02-22 ENCOUNTER — Other Ambulatory Visit: Payer: Self-pay

## 2021-02-22 ENCOUNTER — Ambulatory Visit (INDEPENDENT_AMBULATORY_CARE_PROVIDER_SITE_OTHER): Payer: 59 | Admitting: Gastroenterology

## 2021-02-22 ENCOUNTER — Other Ambulatory Visit: Payer: Self-pay | Admitting: Nurse Practitioner

## 2021-02-22 ENCOUNTER — Encounter: Payer: Self-pay | Admitting: Gastroenterology

## 2021-02-22 VITALS — BP 121/77 | HR 73 | Ht 64.0 in | Wt 122.8 lb

## 2021-02-22 DIAGNOSIS — K6289 Other specified diseases of anus and rectum: Secondary | ICD-10-CM

## 2021-02-22 DIAGNOSIS — R112 Nausea with vomiting, unspecified: Secondary | ICD-10-CM

## 2021-02-22 NOTE — Patient Instructions (Addendum)
You can increase your Metamucil to twice daily.  Continue with the treatment regimen as prescribed by the Urgent Care.  I will see you next week for your endoscopic evaluation. Please call with any questions or concerns prior to that time.
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
## 2021-02-26 DIAGNOSIS — Z8719 Personal history of other diseases of the digestive system: Secondary | ICD-10-CM

## 2021-02-26 HISTORY — DX: Personal history of other diseases of the digestive system: Z87.19

## 2021-03-02 ENCOUNTER — Telehealth: Payer: Self-pay | Admitting: Gastroenterology

## 2021-03-02 ENCOUNTER — Other Ambulatory Visit: Payer: Self-pay

## 2021-03-02 ENCOUNTER — Ambulatory Visit (AMBULATORY_SURGERY_CENTER): Payer: 59 | Admitting: Gastroenterology

## 2021-03-02 ENCOUNTER — Encounter: Payer: Self-pay | Admitting: Gastroenterology

## 2021-03-02 VITALS — BP 100/41 | HR 100 | Temp 98.2°F | Resp 17 | Ht 64.0 in | Wt 122.0 lb

## 2021-03-02 DIAGNOSIS — K6289 Other specified diseases of anus and rectum: Secondary | ICD-10-CM | POA: Diagnosis not present

## 2021-03-02 DIAGNOSIS — K2970 Gastritis, unspecified, without bleeding: Secondary | ICD-10-CM

## 2021-03-02 DIAGNOSIS — K635 Polyp of colon: Secondary | ICD-10-CM

## 2021-03-02 DIAGNOSIS — K2100 Gastro-esophageal reflux disease with esophagitis, without bleeding: Secondary | ICD-10-CM

## 2021-03-02 DIAGNOSIS — K219 Gastro-esophageal reflux disease without esophagitis: Secondary | ICD-10-CM

## 2021-03-02 DIAGNOSIS — D120 Benign neoplasm of cecum: Secondary | ICD-10-CM

## 2021-03-02 DIAGNOSIS — K529 Noninfective gastroenteritis and colitis, unspecified: Secondary | ICD-10-CM

## 2021-03-02 DIAGNOSIS — R197 Diarrhea, unspecified: Secondary | ICD-10-CM

## 2021-03-02 DIAGNOSIS — K2950 Unspecified chronic gastritis without bleeding: Secondary | ICD-10-CM

## 2021-03-02 DIAGNOSIS — K621 Rectal polyp: Secondary | ICD-10-CM | POA: Diagnosis not present

## 2021-03-02 DIAGNOSIS — D128 Benign neoplasm of rectum: Secondary | ICD-10-CM

## 2021-03-02 DIAGNOSIS — R1084 Generalized abdominal pain: Secondary | ICD-10-CM

## 2021-03-02 DIAGNOSIS — K2289 Other specified disease of esophagus: Secondary | ICD-10-CM | POA: Diagnosis not present

## 2021-03-02 DIAGNOSIS — R112 Nausea with vomiting, unspecified: Secondary | ICD-10-CM

## 2021-03-02 HISTORY — PX: COLONOSCOPY WITH ESOPHAGOGASTRODUODENOSCOPY (EGD): SHX5779

## 2021-03-02 MED ORDER — SODIUM CHLORIDE 0.9 % IV SOLN: 500.0000 mL | Freq: Once | INTRAVENOUS | Status: DC

## 2021-03-02 MED ORDER — OMEPRAZOLE 40 MG PO CPDR: 40.0000 mg | DELAYED_RELEASE_CAPSULE | Freq: Two times a day (BID) | ORAL | 11 refills | Status: DC

## 2021-03-02 NOTE — Progress Notes (Signed)
Called to room to assist during endoscopic procedure.  Patient ID and intended procedure confirmed with present staff. Received instructions for my participation in the procedure from the performing physician.  

## 2021-03-02 NOTE — Progress Notes (Signed)
Report given to PACU, vss 

## 2021-03-02 NOTE — Progress Notes (Signed)
1436 Robinul 0.1 mg IV given due large amount of secretions upon assessment.  MD made aware, vss 

## 2021-03-02 NOTE — Telephone Encounter (Signed)
Spoke with pt and told her she must finish prep; understanding voiced

## 2021-03-02 NOTE — Progress Notes (Signed)
Cw vitals and Ph Iv.

## 2021-03-02 NOTE — Patient Instructions (Signed)
Handouts given for polyps, diverticulosis and hemorrhoids.  Await biopsy results.  Pick up your new prescription today.  If your throat is sore, you may gargle with warm salt water or use Cepacol spray.  YOU HAD AN ENDOSCOPIC PROCEDURE TODAY AT [facility]:   Refer to the procedure report that was given to you for any specific questions about what was found during the examination.  If the procedure report does not answer your questions, please call your gastroenterologist to clarify.  If you requested that your care partner not be given the details of your procedure findings, then the procedure report has been included in a sealed envelope for you to review at your convenience later.  YOU SHOULD EXPECT: Some feelings of bloating in the abdomen. Passage of more gas than usual.  Walking can help get rid of the air that was put into your GI tract during the procedure and reduce the bloating. If you had a lower endoscopy (such as a colonoscopy or flexible sigmoidoscopy) you may notice spotting of blood in your stool or on the toilet paper. If you underwent a bowel prep for your procedure, you may not have a normal bowel movement for a few days.  Please Note:  You might notice some irritation and congestion in your nose or some drainage.  This is from the oxygen used during your procedure.  There is no need for concern and it should clear up in a day or so.  SYMPTOMS TO REPORT IMMEDIATELY:   Following lower endoscopy (colonoscopy or flexible sigmoidoscopy):  Excessive amounts of blood in the stool  Significant tenderness or worsening of abdominal pains  Swelling of the abdomen that is new, acute  Fever of 100F or higher   Following upper endoscopy (EGD)  Vomiting of blood or coffee ground material  New chest pain or pain under the shoulder blades  Painful or persistently difficult swallowing  New shortness of breath  Fever of 100F or higher  Black, tarry-looking  stools  For urgent or emergent issues, a gastroenterologist can be reached at any hour by calling [phone]. Do not use MyChart messaging for urgent concerns.    DIET:  We do recommend a small meal at first, but then you may proceed to your regular diet.  Drink plenty of fluids but you should avoid alcoholic beverages for 24 hours.  ACTIVITY:  You should plan to take it easy for the rest of today and you should NOT DRIVE or use heavy machinery until tomorrow (because of the sedation medicines used during the test).    FOLLOW UP: Our staff will call the number listed on your records 48-72 hours following your procedure to check on you and address any questions or concerns that you may have regarding the information given to you following your procedure. If we do not reach you, we will leave a message.  We will attempt to reach you two times.  During this call, we will ask if you have developed any symptoms of COVID 19. If you develop any symptoms (ie: fever, flu-like symptoms, shortness of breath, cough etc.) before then, please call [phone].  If you test positive for Covid 19 in the 2 weeks post procedure, please call and report this information to [place].    If any biopsies were taken you will be contacted by phone or by letter within the next 1-3 weeks.  Please call us at [phone] if you have not heard about the biopsies in  3 weeks.    SIGNATURES/CONFIDENTIALITY: You and/or your care partner have signed paperwork which will be entered into your electronic medical record.  These signatures attest to the fact that that the information above on your After Visit Summary has been reviewed and is understood.  Full responsibility of the confidentiality of this discharge information lies with you and/or your care-partner.

## 2021-03-02 NOTE — Op Note (Signed)
[facility] Patient Name: Gina Dorsey Procedure Date: 03/02/2021 2:52 PM MRN: [id number] Endoscopist: Thornton Park MD, MD Age: 30 Referring MD:  Date of Birth: 02-21-1991 Gender: Female Account #: [id number] Procedure:                Colonoscopy Indications:              Abdominal pain, Chronic diarrhea Medicines:                Monitored Anesthesia Care Procedure:                Pre-Anesthesia Assessment:                           - Prior to the procedure, a History and Physical                            was performed, and patient medications and                            allergies were reviewed. The patient's tolerance of                            previous anesthesia was also reviewed. The risks                            and benefits of the procedure and the sedation                            options and risks were discussed with the patient.                            All questions were answered, and informed consent                            was obtained. Prior Anticoagulants: The patient has                            taken no previous anticoagulant or antiplatelet                            agents. ASA Grade Assessment: II - A patient with                            mild systemic disease. After reviewing the risks                            and benefits, the patient was deemed in                            satisfactory condition to undergo the procedure.                           - Prior to the procedure, a History and Physical  was performed, and patient medications and                            allergies were reviewed. The patient's tolerance of                            previous anesthesia was also reviewed. The risks                            and benefits of the procedure and the sedation                            options and risks were discussed with the patient.                            All questions were answered, and  informed consent                            was obtained. Prior Anticoagulants: The patient has                            taken no previous anticoagulant or antiplatelet                            agents. ASA Grade Assessment: II - A patient with                            mild systemic disease. After reviewing the risks                            and benefits, the patient was deemed in                            satisfactory condition to undergo the procedure.                           After obtaining informed consent, the colonoscope                            was passed under direct vision. Throughout the                            procedure, the patient's blood pressure, pulse, and                            oxygen saturations were monitored continuously. The                            Olympus PFC-[id number] (#[id number]) Colonoscope was                            introduced through the anus and advanced to the the  cecum, identified by appendiceal orifice and                            ileocecal valve. The colonoscopy was performed                            without difficulty. The patient tolerated the                            procedure well. The quality of the bowel                            preparation was good. The ileocecal valve,                            appendiceal orifice, and rectum were photographed. Scope In: 2:54:54 PM Scope Out: 3:11:25 PM Scope Withdrawal Time: 0 hours 13 minutes 15 seconds  Total Procedure Duration: 0 hours 16 minutes 31 seconds  Findings:                 An anal fissure was found on perianal exam.                           The colon (entire examined portion) appeared                            normal. Biopsies were taken throughout the colon                            with a cold forceps for histology. Estimated blood                            loss was minimal.                           Three sessile polyps were found in the  rectum and                            cecum. The polyps were 1 to 3 mm in size. These                            polyps were removed with a cold snare. Resection                            and retrieval were complete. Estimated blood loss                            was minimal.                           I was unable to intubate the terminal ileum due to                            sharp angulation of the IC valve. The exam was  otherwise without abnormality on direct and                            retroflexion views except for internal hemorrhoids. Complications:            No immediate complications. Estimated blood loss:                            Minimal. Estimated Blood Loss:     Estimated blood loss was minimal. Impression:               - Anal fissure found on perianal exam.                           - The entire examined colon is normal. Biopsied.                           - Three 1 to 3 mm polyps in the rectum and in the                            cecum, removed with a cold snare. Resected and                            retrieved.                           - The examination was otherwise normal on direct                            and retroflexion views. Recommendation:           - Patient has a contact number available for                            emergencies. The signs and symptoms of potential                            delayed complications were discussed with the                            patient. Return to normal activities tomorrow.                            Written discharge instructions were provided to the                            patient.                           - Resume previous diet.                           - Continue present medications.                           - Await pathology results.                           -  Repeat colonoscopy date to be determined after                            pending pathology results are reviewed for                             surveillance.                           - Emerging evidence supports eating a diet of                            fruits, vegetables, grains, calcium, and yogurt                            while reducing red meat and alcohol may reduce the                            risk of colon cancer.                           - Consider CTE if biopsy are normal and symptoms                            continue. Thornton Park MD, MD 03/02/2021 3:28:03 PM This report has been signed electronically.

## 2021-03-02 NOTE — Op Note (Signed)
[facility] Patient Name: Gina Dorsey Procedure Date: 03/02/2021 2:37 PM MRN: [id number] Endoscopist: Thornton Park MD, MD Age: 30 Referring MD:  Date of Birth: 02-09-1991 Gender: Female Account #: [id number] Procedure:                Upper GI endoscopy Indications:              Nausea, vomiting, abdominal pain, diarrhea                           - not explained by CT or ultrasound                           - labs negative for celiac                           - normal TSH, liver enzymes                           - normal CRP Procedure:                Pre-Anesthesia Assessment:                           - Prior to the procedure, a History and Physical                            was performed, and patient medications and                            allergies were reviewed. The patient's tolerance of                            previous anesthesia was also reviewed. The risks                            and benefits of the procedure and the sedation                            options and risks were discussed with the patient.                            All questions were answered, and informed consent                            was obtained. Prior Anticoagulants: The patient has                            taken no previous anticoagulant or antiplatelet                            agents. ASA Grade Assessment: II - A patient with                            mild systemic disease. After reviewing the risks  and benefits, the patient was deemed in                            satisfactory condition to undergo the procedure.                           After obtaining informed consent, the endoscope was                            passed under direct vision. Throughout the                            procedure, the patient's blood pressure, pulse, and                            oxygen saturations were monitored continuously. The                             Endoscope was introduced through the mouth, and                            advanced to the third part of duodenum. The upper                            GI endoscopy was accomplished without difficulty.                            The patient tolerated the procedure well. Scope In: Scope Out: Findings:                 LA Grade A (one or more mucosal breaks less than 5                            mm, not extending between tops of 2 mucosal folds)                            esophagitis with no bleeding was found. Biopsies                            were taken from the proximal/mid and distal                            esophagus with a cold forceps for histology.                            Estimated blood loss was minimal.                           The entire examined stomach was normal. Biopsies                            were taken from the antrum, body, and fundus with a  cold forceps for histology. Estimated blood loss                            was minimal.                           The examined duodenum was normal. Biopsies were                            taken with a cold forceps for histology. Estimated                            blood loss was minimal. Complications:            No immediate complications. Estimated blood loss:                            Minimal. Estimated Blood Loss:     Estimated blood loss was minimal. Impression:               - LA Grade A reflux esophagitis with no bleeding.                            Biopsied.                           - Normal stomach. Biopsied.                           - Normal examined duodenum. Biopsied. Recommendation:           - Patient has a contact number available for                            emergencies. The signs and symptoms of potential                            delayed complications were discussed with the                            patient. Return to normal activities tomorrow.                             Written discharge instructions were provided to the                            patient.                           - Resume previous diet.                           - Continue present medications. Increase omeprazole                            40 mg BID.                           -  Await pathology results.                           - Follow-up in the office in 3-4 weeks, earlier if                            needed. Thornton Park MD, MD 03/02/2021 3:23:33 PM This report has been signed electronically.

## 2021-03-04 ENCOUNTER — Telehealth: Payer: Self-pay

## 2021-03-04 NOTE — Telephone Encounter (Signed)
  Follow up Call-  Call back number 03/02/2021  Post procedure Call Back phone  # [phone]  Permission to leave phone message Yes  Some recent data might be hidden     Patient questions:  Do you have a fever, pain , or abdominal swelling? No. Pain Score  0 *  Have you tolerated food without any problems? Yes.    Have you been able to return to your normal activities? Yes.    Do you have any questions about your discharge instructions: Diet   No. Medications  No. Follow up visit  No.  Do you have questions or concerns about your Care? No.  Actions: * If pain score is 4 or above: No action needed, pain <4.   1. Have you developed a fever since your procedure? No   2.   Have you had an respiratory symptoms (SOB or cough) since your procedure? No   3.   Have you tested positive for COVID 19 since your procedure? No   4.   Have you had any family members/close contacts diagnosed with the COVID 19 since your procedure?  No    If yes to any of these questions please route to Joylene John, RN and Joella Prince, RN

## 2021-03-17 ENCOUNTER — Encounter: Payer: Self-pay | Admitting: Gastroenterology

## 2021-04-06 ENCOUNTER — Emergency Department (HOSPITAL_BASED_OUTPATIENT_CLINIC_OR_DEPARTMENT_OTHER): Payer: 59

## 2021-04-06 ENCOUNTER — Other Ambulatory Visit: Payer: Self-pay

## 2021-04-06 ENCOUNTER — Emergency Department (HOSPITAL_BASED_OUTPATIENT_CLINIC_OR_DEPARTMENT_OTHER)
Admission: EM | Admit: 2021-04-06 | Discharge: 2021-04-06 | Disposition: A | Discharge status: Home / Self Care | Payer: 59 | Attending: Emergency Medicine | Admitting: Emergency Medicine

## 2021-04-06 ENCOUNTER — Encounter (HOSPITAL_BASED_OUTPATIENT_CLINIC_OR_DEPARTMENT_OTHER): Payer: Self-pay

## 2021-04-06 DIAGNOSIS — R109 Unspecified abdominal pain: Secondary | ICD-10-CM | POA: Diagnosis not present

## 2021-04-06 DIAGNOSIS — R112 Nausea with vomiting, unspecified: Secondary | ICD-10-CM | POA: Diagnosis present

## 2021-04-06 DIAGNOSIS — Z91040 Latex allergy status: Secondary | ICD-10-CM | POA: Diagnosis not present

## 2021-04-06 DIAGNOSIS — F1410 Cocaine abuse, uncomplicated: Secondary | ICD-10-CM | POA: Insufficient documentation

## 2021-04-06 DIAGNOSIS — F17210 Nicotine dependence, cigarettes, uncomplicated: Secondary | ICD-10-CM | POA: Diagnosis not present

## 2021-04-06 LAB — PREGNANCY, URINE: Preg Test, Ur: NEGATIVE

## 2021-04-06 LAB — URINALYSIS, ROUTINE W REFLEX MICROSCOPIC
Bilirubin Urine: NEGATIVE
Glucose, UA: NEGATIVE mg/dL
Ketones, ur: >80 mg/dL — AB
Nitrite: NEGATIVE
Protein, ur: 30 mg/dL — AB
Specific Gravity, Urine: 1.022 (ref 1.005–1.030)
pH: 7 (ref 5.0–8.0)

## 2021-04-06 LAB — COMPREHENSIVE METABOLIC PANEL
ALT: 14 U/L (ref 0–44)
AST: 21 U/L (ref 15–41)
Albumin: 4.6 g/dL (ref 3.5–5.0)
Alkaline Phosphatase: 72 U/L (ref 38–126)
Anion gap: 13 (ref 5–15)
BUN: 8 mg/dL (ref 6–20)
CO2: 27 mmol/L (ref 22–32)
Calcium: 10.1 mg/dL (ref 8.9–10.3)
Chloride: 102 mmol/L (ref 98–111)
Creatinine, Ser: 0.73 mg/dL (ref 0.44–1.00)
GFR, Estimated: >60 mL/min (ref >60)
Glucose, Bld: 104 mg/dL — ABNORMAL HIGH (ref 70–99)
Potassium: 4 mmol/L (ref 3.5–5.1)
Sodium: 142 mmol/L (ref 135–145)
Total Bilirubin: 0.4 mg/dL (ref 0.3–1.2)
Total Protein: 8.3 g/dL — ABNORMAL HIGH (ref 6.5–8.1)

## 2021-04-06 LAB — CBC
HCT: 45.5 % (ref 36.0–46.0)
Hemoglobin: 14.9 g/dL (ref 12.0–15.0)
MCH: 30.4 pg (ref 26.0–34.0)
MCHC: 32.7 g/dL (ref 30.0–36.0)
MCV: 92.9 fL (ref 80.0–100.0)
Platelets: 327 10*3/uL (ref 150–400)
RBC: 4.9 MIL/uL (ref 3.87–5.11)
RDW: 13 % (ref 11.5–15.5)
WBC: 11.8 10*3/uL — ABNORMAL HIGH (ref 4.0–10.5)
nRBC: 0 % (ref 0.0–0.2)

## 2021-04-06 LAB — RAPID URINE DRUG SCREEN, HOSP PERFORMED
Amphetamines: NOT DETECTED
Barbiturates: NOT DETECTED
Benzodiazepines: NOT DETECTED
Cocaine: POSITIVE — AB
Opiates: NOT DETECTED
Tetrahydrocannabinol: POSITIVE — AB

## 2021-04-06 LAB — LIPASE, BLOOD: Lipase: <10 U/L — ABNORMAL LOW (ref 11–51)

## 2021-04-06 IMAGING — CT CT ABD-PELV W/ CM
2 of 4 series · 16 of 46 positions shown, 18 images · IV contrast (APPLIED)
Comparison: None.

CLINICAL DATA: Nonlocalized acute abdominal pain

EXAM:
CT ABDOMEN AND PELVIS WITH CONTRAST
TECHNIQUE: Multidetector CT imaging of the abdomen and pelvis was performed
using the standard protocol following bolus administration of
intravenous contrast.
CONTRAST:  75mL OMNIPAQUE IOHEXOL 300 MG/ML  SOLN

[Series 2: abd pel w · axial · 0.60mm/px · z∈[-978,-593]mm · 13 of 85 slices shown, 15 images]
[im 4/85  soft-tissue]
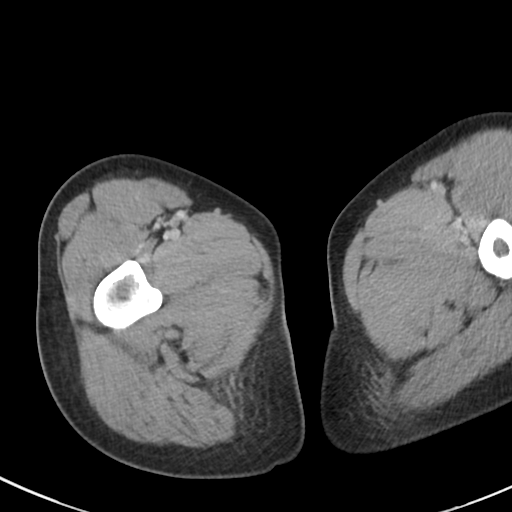
[im 4/85  bone]
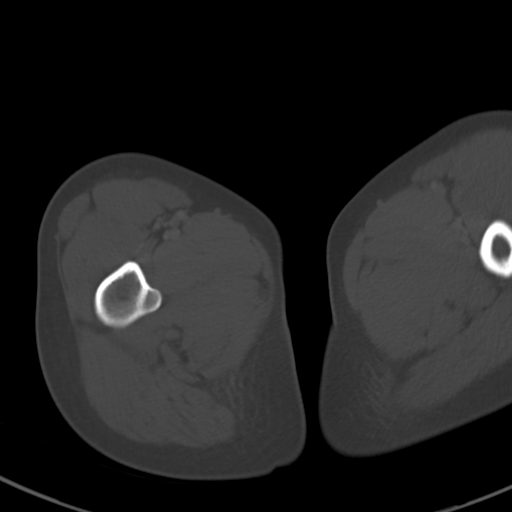
[im 10/85  soft-tissue]
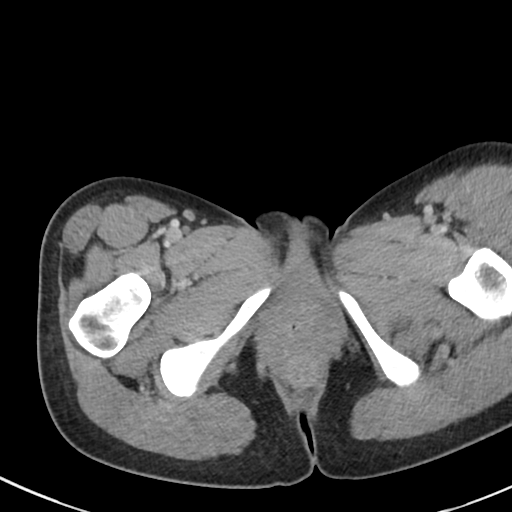
[im 17/85  soft-tissue]
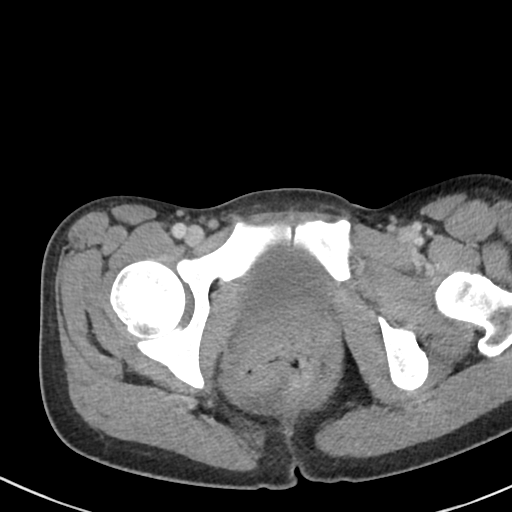
[im 23/85  soft-tissue]
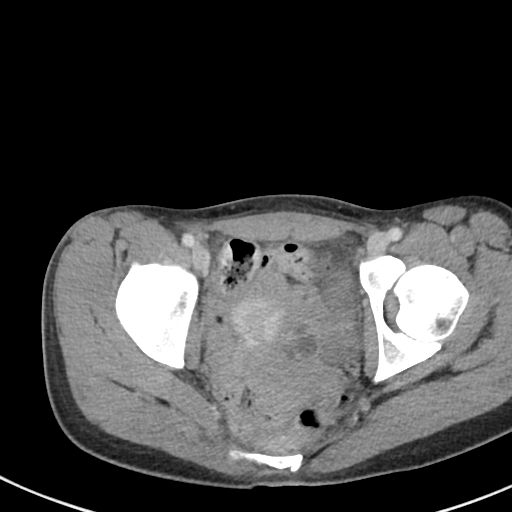
[im 30/85  soft-tissue]
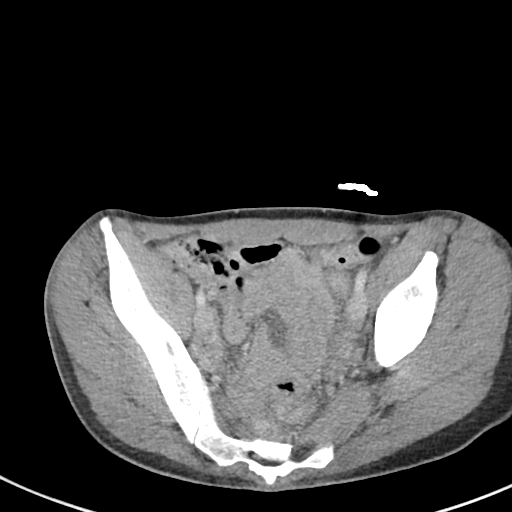
[im 36/85  soft-tissue]
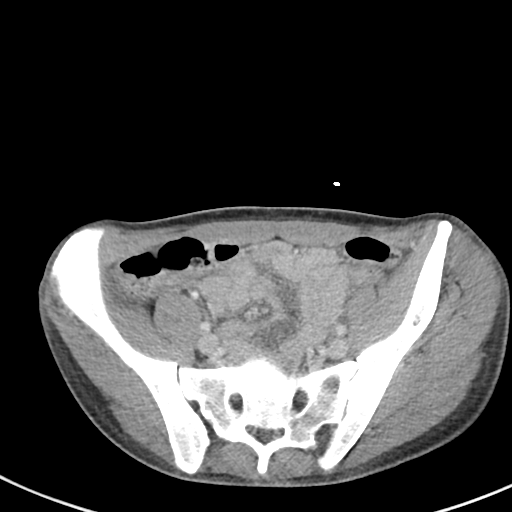
[im 43/85  soft-tissue]
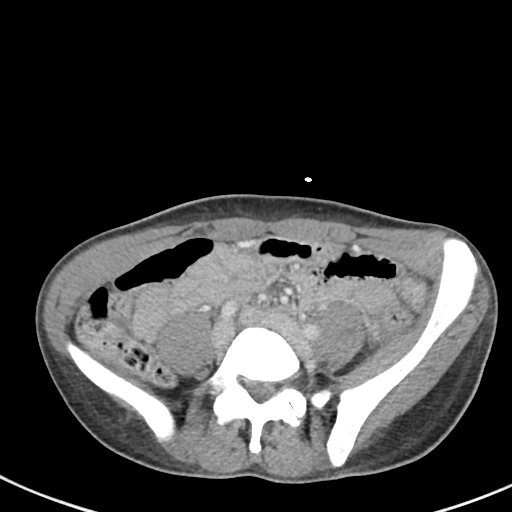
[im 49/85  soft-tissue]
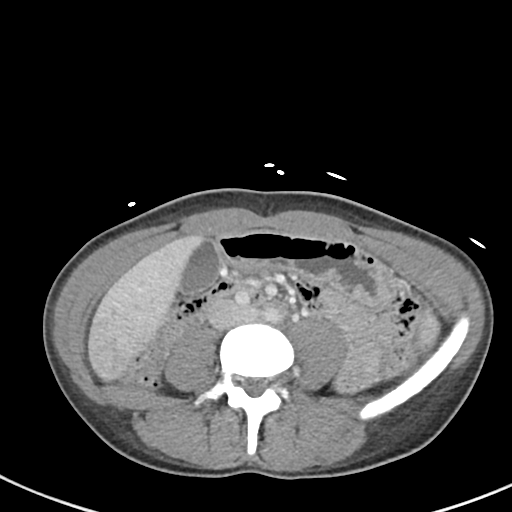
[im 55/85  soft-tissue]
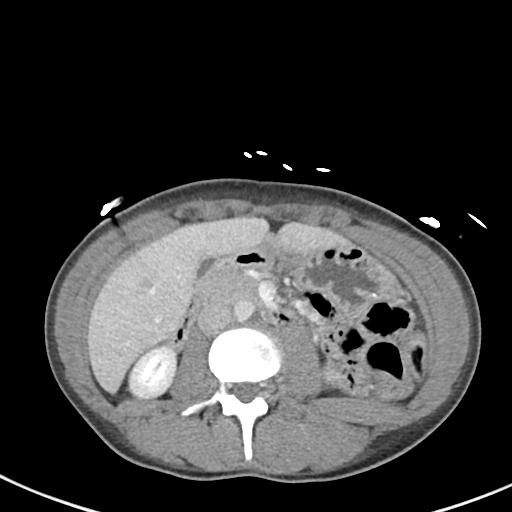
[im 55/85  bone]
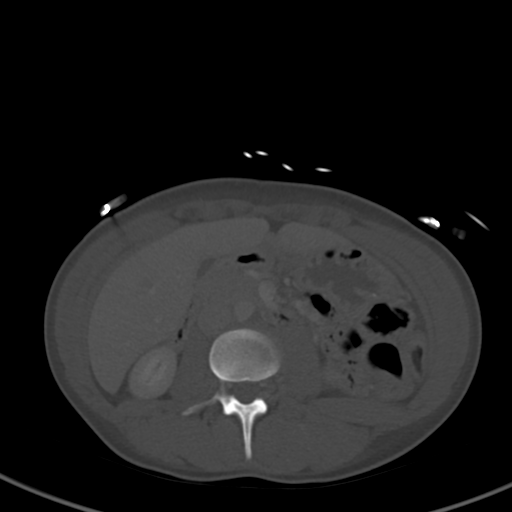
[im 62/85  soft-tissue]
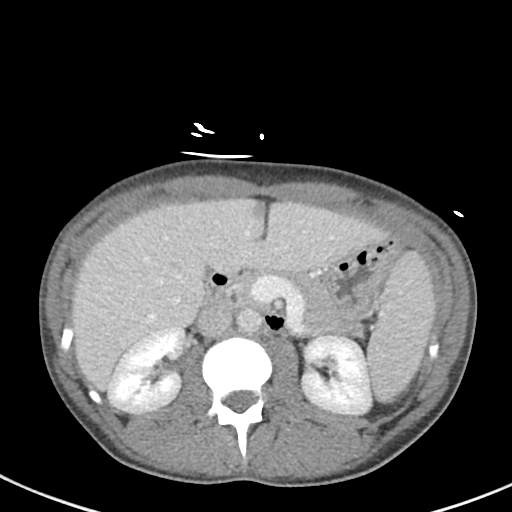
[im 68/85  soft-tissue]
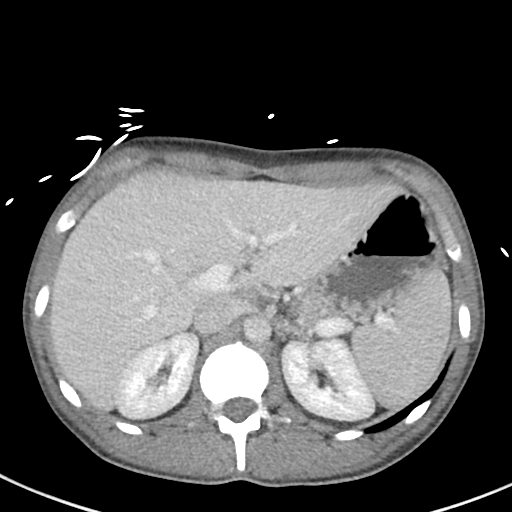
[im 75/85  soft-tissue]
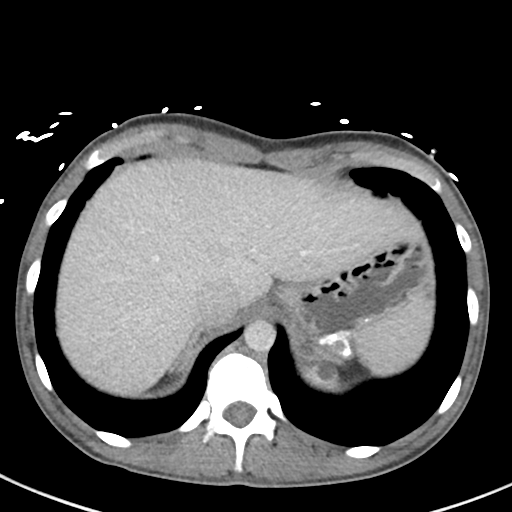
[im 81/85  soft-tissue]
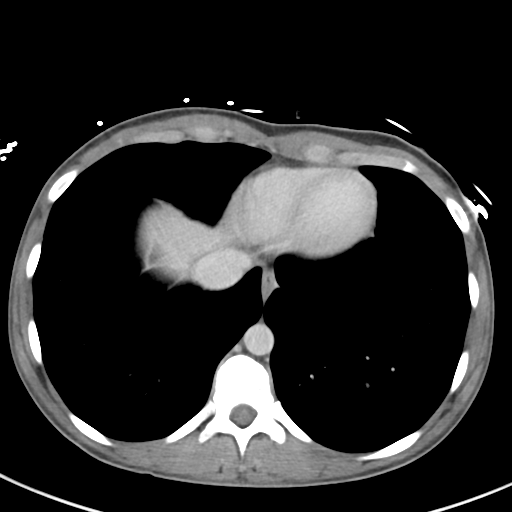

[Series 5: coronal · coronal · 0.62mm/px · 3 of 79 slices shown]
[im 27/79  soft-tissue]
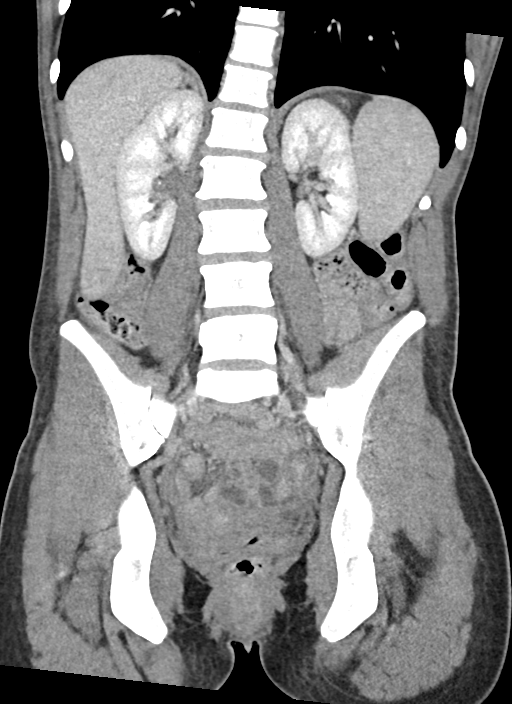
[im 35/79  soft-tissue]
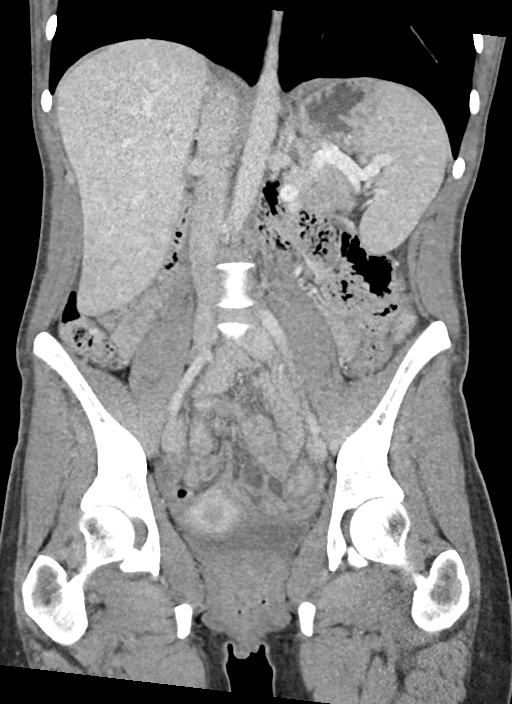
[im 44/79  soft-tissue]
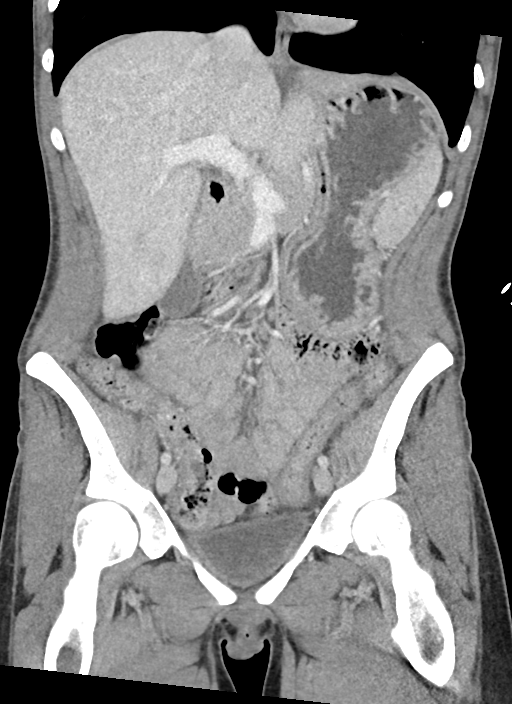

[16 of 46 positions shown; findings below may reference images not displayed]

FINDINGS: Lower chest: No acute abnormality.

Hepatobiliary: No focal liver abnormality. No gallstones,
gallbladder wall thickening, or pericholecystic fluid. No biliary
dilatation.

Pancreas: No focal lesion. Normal pancreatic contour. No surrounding
inflammatory changes. No main pancreatic ductal dilatation.

Spleen: Normal in size without focal abnormality.

Adrenals/Urinary Tract:

No adrenal nodule bilaterally.

Bilateral kidneys enhance symmetrically. 1.1 cm fluid density lesion
left kidney likely represents a simple renal cyst. No
hydronephrosis. No hydroureter.

The urinary bladder is unremarkable.

Stomach/Bowel: Stomach is within normal limits. No evidence of bowel
wall thickening or dilatation. Appendix is not definitely
identified.

Vascular/Lymphatic: No significant vascular findings are present. No
enlarged abdominal or pelvic lymph nodes.

Reproductive: Uterus and bilateral adnexa are unremarkable.

Other: No intraperitoneal free fluid. No intraperitoneal free gas.
No organized fluid collection.

Musculoskeletal: No acute or significant osseous findings.
IMPRESSION: No acute LABRA intrapelvic abnormality.

## 2021-04-06 MED ORDER — ONDANSETRON 8 MG PO TBDP: 8.0000 mg | ORAL_TABLET | Freq: Three times a day (TID) | ORAL | 0 refills | Status: DC | PRN

## 2021-04-06 MED ORDER — HALOPERIDOL LACTATE 5 MG/ML IJ SOLN
2.0000 mg | Freq: Once | INTRAMUSCULAR | Status: DC
  Filled 2021-04-06: qty 1

## 2021-04-06 MED ORDER — ALUM & MAG HYDROXIDE-SIMETH 200-200-20 MG/5ML PO SUSP
30.0000 mL | Freq: Once | ORAL | Status: AC
  Administered 2021-04-06: 30 mL via ORAL
  Filled 2021-04-06: qty 30

## 2021-04-06 MED ORDER — IOHEXOL 300 MG/ML  SOLN
75.0000 mL | Freq: Once | INTRAMUSCULAR | Status: AC | PRN
  Administered 2021-04-06: 75 mL via INTRAVENOUS

## 2021-04-06 MED ORDER — FAMOTIDINE IN NACL 20-0.9 MG/50ML-% IV SOLN
20.0000 mg | Freq: Once | INTRAVENOUS | Status: AC
  Administered 2021-04-06: 20 mg via INTRAVENOUS
  Filled 2021-04-06: qty 50

## 2021-04-06 MED ORDER — SODIUM CHLORIDE 0.9 % IV BOLUS (SEPSIS)
1000.0000 mL | Freq: Once | INTRAVENOUS | Status: AC
  Administered 2021-04-06: 1000 mL via INTRAVENOUS

## 2021-04-06 MED ORDER — KETOROLAC TROMETHAMINE 30 MG/ML IJ SOLN
30.0000 mg | Freq: Once | INTRAMUSCULAR | Status: AC
  Administered 2021-04-06: 30 mg via INTRAVENOUS
  Filled 2021-04-06: qty 1

## 2021-04-06 MED ORDER — DICYCLOMINE HCL 10 MG PO CAPS: 10.0000 mg | ORAL_CAPSULE | Freq: Every day | ORAL | 1 refills | Status: DC | PRN

## 2021-04-06 MED ORDER — LORAZEPAM 2 MG/ML IJ SOLN
1.0000 mg | Freq: Once | INTRAMUSCULAR | Status: AC
  Administered 2021-04-06: 1 mg via INTRAVENOUS
  Filled 2021-04-06: qty 1

## 2021-04-06 MED ORDER — ONDANSETRON HCL 4 MG/2ML IJ SOLN
4.0000 mg | Freq: Once | INTRAMUSCULAR | Status: AC
  Administered 2021-04-06: 4 mg via INTRAVENOUS
  Filled 2021-04-06: qty 2

## 2021-04-06 MED ORDER — SODIUM CHLORIDE 0.9 % IV SOLN
25.0000 mg | Freq: Four times a day (QID) | INTRAVENOUS | Status: DC | PRN
  Filled 2021-04-06: qty 1

## 2021-04-06 MED ORDER — PROMETHAZINE HCL 25 MG/ML IJ SOLN
INTRAMUSCULAR | Status: AC
  Administered 2021-04-06: 25 mg
  Filled 2021-04-06: qty 1

## 2021-04-06 MED ORDER — SODIUM CHLORIDE 0.9 % IV SOLN
1000.0000 mL | INTRAVENOUS | Status: DC
  Administered 2021-04-06: 1000 mL via INTRAVENOUS

## 2021-04-06 NOTE — ED Notes (Signed)
Pt is dc'ed ambulatory with her dad - with no signs of distress -

## 2021-04-06 NOTE — ED Notes (Signed)
Pt is started to scream and restless again   - will give the ativan and haldol as MD order

## 2021-04-06 NOTE — ED Notes (Signed)
In and out cathed patient and per pt request father stayed in the room.
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
## 2021-04-06 NOTE — Discharge Instructions (Signed)
Continue your Zofran to try and help with your nausea and vomiting.  Take the dicyclomine to see if that helps with the abdominal cramping.  Follow-up with your GI doctor

## 2021-04-06 NOTE — ED Triage Notes (Signed)
Abdominal pain nausea, vomiting, starting last night after dinner.

## 2021-04-06 NOTE — ED Notes (Signed)
Pt is comfortable and relaxed at this time - states she doesn't want to  Have the haldol and ativan - informed  EDP

## 2021-04-06 NOTE — ED Notes (Signed)
Pt is back from CT - pt is comfortable and relaxed at this time

## 2021-04-06 NOTE — ED Notes (Signed)
Pt is transported  To CT - pt refused the Haldol at this moment - pt has  Been moved via [facility]

## 2021-04-07 ENCOUNTER — Emergency Department (HOSPITAL_BASED_OUTPATIENT_CLINIC_OR_DEPARTMENT_OTHER): Payer: 59

## 2021-04-07 ENCOUNTER — Other Ambulatory Visit: Payer: Self-pay

## 2021-04-07 ENCOUNTER — Emergency Department (HOSPITAL_BASED_OUTPATIENT_CLINIC_OR_DEPARTMENT_OTHER): Payer: 59 | Admitting: Radiology

## 2021-04-07 ENCOUNTER — Encounter (HOSPITAL_BASED_OUTPATIENT_CLINIC_OR_DEPARTMENT_OTHER): Payer: Self-pay | Admitting: Emergency Medicine

## 2021-04-07 ENCOUNTER — Emergency Department (HOSPITAL_BASED_OUTPATIENT_CLINIC_OR_DEPARTMENT_OTHER)
Admission: EM | Admit: 2021-04-07 | Discharge: 2021-04-07 | Disposition: A | Discharge status: Home / Self Care | Payer: 59 | Attending: Emergency Medicine | Admitting: Emergency Medicine

## 2021-04-07 DIAGNOSIS — Z91040 Latex allergy status: Secondary | ICD-10-CM | POA: Insufficient documentation

## 2021-04-07 DIAGNOSIS — S199XXA Unspecified injury of neck, initial encounter: Secondary | ICD-10-CM | POA: Diagnosis present

## 2021-04-07 DIAGNOSIS — Y92410 Unspecified street and highway as the place of occurrence of the external cause: Secondary | ICD-10-CM | POA: Diagnosis not present

## 2021-04-07 DIAGNOSIS — F17210 Nicotine dependence, cigarettes, uncomplicated: Secondary | ICD-10-CM | POA: Insufficient documentation

## 2021-04-07 DIAGNOSIS — S3992XA Unspecified injury of lower back, initial encounter: Secondary | ICD-10-CM | POA: Diagnosis not present
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
## 2021-04-07 MED ORDER — LIDOCAINE 5 % EX PTCH
1.0000 | MEDICATED_PATCH | CUTANEOUS | 0 refills | Status: DC
Start: 2021-04-07 — End: 2021-12-10

## 2021-04-07 MED ORDER — LIDOCAINE 5 % EX PTCH
3.0000 | MEDICATED_PATCH | CUTANEOUS | Status: DC
  Administered 2021-04-07: 3 via TRANSDERMAL
  Filled 2021-04-07: qty 3

## 2021-04-07 MED ORDER — DICLOFENAC SODIUM 1 % EX GEL: 4.0000 g | Freq: Four times a day (QID) | CUTANEOUS | 0 refills | Status: DC

## 2021-04-07 MED ORDER — KETOROLAC TROMETHAMINE 60 MG/2ML IM SOLN
15.0000 mg | Freq: Once | INTRAMUSCULAR | Status: AC
  Administered 2021-04-07: 15 mg via INTRAMUSCULAR
  Filled 2021-04-07: qty 2
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
## 2021-04-07 NOTE — ED Notes (Signed)
Pt transported to CT ?

## 2021-04-20 ENCOUNTER — Ambulatory Visit: Payer: 59 | Admitting: Gastroenterology

## 2021-06-09 ENCOUNTER — Other Ambulatory Visit: Payer: Self-pay

## 2021-06-09 ENCOUNTER — Encounter (HOSPITAL_COMMUNITY): Payer: Self-pay

## 2021-06-09 ENCOUNTER — Emergency Department (HOSPITAL_COMMUNITY)
Admission: EM | Admit: 2021-06-09 | Discharge: 2021-06-09 | Disposition: A | Discharge status: Home / Self Care | Payer: 59 | Attending: Emergency Medicine | Admitting: Emergency Medicine

## 2021-06-09 DIAGNOSIS — Z5321 Procedure and treatment not carried out due to patient leaving prior to being seen by health care provider: Secondary | ICD-10-CM | POA: Insufficient documentation

## 2021-06-09 DIAGNOSIS — R102 Pelvic and perineal pain: Secondary | ICD-10-CM | POA: Diagnosis not present

## 2021-06-09 DIAGNOSIS — R112 Nausea with vomiting, unspecified: Secondary | ICD-10-CM | POA: Diagnosis not present

## 2021-06-09 NOTE — ED Triage Notes (Signed)
Menstrual period started 3 days ago and since been having pelvic pain with n/v.  EMS vitals HR:100 Bp: 138/98 Cbg-101

## 2021-06-09 NOTE — ED Notes (Signed)
Patient walked out of room and out triage doors after triaging patient. Called patient by her name and she kept walking and ignored nurse.

## 2021-06-15 ENCOUNTER — Other Ambulatory Visit: Payer: Self-pay | Admitting: Nurse Practitioner

## 2021-06-21 ENCOUNTER — Other Ambulatory Visit: Payer: Self-pay | Admitting: Nurse Practitioner

## 2021-07-27 ENCOUNTER — Other Ambulatory Visit: Payer: Self-pay

## 2021-07-27 ENCOUNTER — Encounter (HOSPITAL_COMMUNITY): Payer: Self-pay

## 2021-07-27 ENCOUNTER — Ambulatory Visit (HOSPITAL_COMMUNITY)
Admission: RE | Admit: 2021-07-27 | Discharge: 2021-07-27 | Disposition: A | Discharge status: Home / Self Care | Payer: 59 | Source: Ambulatory Visit | Attending: Emergency Medicine | Admitting: Emergency Medicine

## 2021-07-27 VITALS — BP 122/62 | HR 91 | Temp 98.1°F | Resp 19

## 2021-07-27 DIAGNOSIS — J0100 Acute maxillary sinusitis, unspecified: Secondary | ICD-10-CM

## 2021-07-27 MED ORDER — FLUCONAZOLE 150 MG PO TABS: ORAL_TABLET | ORAL | 0 refills | Status: DC

## 2021-07-27 MED ORDER — AMOXICILLIN-POT CLAVULANATE 875-125 MG PO TABS: 1.0000 | ORAL_TABLET | Freq: Two times a day (BID) | ORAL | 0 refills | Status: DC
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
## 2021-07-27 NOTE — ED Triage Notes (Signed)
Pt presents with nasal congestion and sinus pressure x 2 weeks.   Pt states she has tried OTC medicine and states it has not helped.

## 2021-07-27 NOTE — Discharge Instructions (Addendum)
Take the antibiotic twice a day for the next 7 days.   You can take Tylenol and/or Ibuprofen as needed for pain and fevers.  Make sure you are drinking plenty of fluids, especially water.   You can use Flonase 1 nasal spray in each nostril daily.   Return or go to the Emergency Department if symptoms worsen or do not improve in the next few days.

## 2021-08-03 ENCOUNTER — Encounter: Payer: Self-pay | Admitting: Gastroenterology

## 2021-08-03 ENCOUNTER — Ambulatory Visit (INDEPENDENT_AMBULATORY_CARE_PROVIDER_SITE_OTHER): Payer: 59 | Admitting: Gastroenterology

## 2021-08-03 VITALS — BP 130/86 | HR 80 | Ht 65.0 in | Wt 123.5 lb

## 2021-08-03 DIAGNOSIS — K219 Gastro-esophageal reflux disease without esophagitis: Secondary | ICD-10-CM | POA: Diagnosis not present

## 2021-08-03 DIAGNOSIS — R1030 Lower abdominal pain, unspecified: Secondary | ICD-10-CM | POA: Diagnosis not present

## 2021-08-03 DIAGNOSIS — Z7689 Persons encountering health services in other specified circumstances: Secondary | ICD-10-CM

## 2021-08-03 MED ORDER — DICYCLOMINE HCL 10 MG PO CAPS: 20.0000 mg | ORAL_CAPSULE | Freq: Four times a day (QID) | ORAL | 11 refills | Status: DC | PRN

## 2021-08-03 MED ORDER — OMEPRAZOLE 40 MG PO CPDR: 40.0000 mg | DELAYED_RELEASE_CAPSULE | Freq: Every day | ORAL | 11 refills | Status: DC

## 2021-08-03 NOTE — Patient Instructions (Addendum)
It was my pleasure to provide care to you today. Based on our discussion, I am providing you with my recommendations below:  RECOMMENDATION(S):   Please resume your Probiotic Please resume your Metamucil 2 times daily Avoid dairy, sugar substitutes, carbonated beverages I am sending you with trial samples of Linzess 153mg. Please titrate to meet your needs  REFERRAL:  A referral, your demographics, a copy of your insurance card and your records will be sent to [facility] You will receive a call from their office regarding the date, time and location of your appointment.  PRESCRIPTION MEDICATION(S):   We have sent the following medication(s) to your pharmacy:  Omeprazole Dicyclomine  NOTE: If your medication(s) requires a PRIOR AUTHORIZATION, we will receive notification from your pharmacy. Once received, the process to submit for approval may take up to 7-10 business days. You will be contacted about any denials we have received from your insurance company as well as alternatives recommended by your provider.  FOLLOW UP:  I would like for you to follow up with me in 1 MONTH. Please refer to your appointments included within this After Visit Summary.  BMI:  If you are age 3263or younger, your body mass index should be between 19-25. Your Body mass index is 20.55 kg/m. If this is out of the aformentioned range listed, please consider follow up with your Primary Care Provider.   MY CHART:  The [facility] providers would like to encourage you to use [facility] communicate with providers for non-urgent requests or questions.  Due to long hold times on the telephone, sending your provider a message by [facility] be a faster and more efficient way to get a response.  Please allow 48 business hours for a response.  Please remember that this is for non-urgent requests.   Thank you for trusting me with your gastrointestinal care!    [facility] MD, MPH
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
## 2021-09-03 ENCOUNTER — Ambulatory Visit: Payer: 59 | Admitting: Gastroenterology

## 2021-10-18 ENCOUNTER — Other Ambulatory Visit: Payer: Self-pay | Admitting: Nurse Practitioner

## 2021-10-20 ENCOUNTER — Telehealth: Payer: Self-pay | Admitting: Orthopaedic Surgery

## 2021-10-20 ENCOUNTER — Other Ambulatory Visit: Payer: Self-pay

## 2021-10-20 ENCOUNTER — Encounter: Payer: Self-pay | Admitting: Physician Assistant

## 2021-10-20 ENCOUNTER — Ambulatory Visit (INDEPENDENT_AMBULATORY_CARE_PROVIDER_SITE_OTHER): Payer: 59

## 2021-10-20 ENCOUNTER — Ambulatory Visit (INDEPENDENT_AMBULATORY_CARE_PROVIDER_SITE_OTHER): Payer: 59 | Admitting: Physician Assistant

## 2021-10-20 DIAGNOSIS — M545 Low back pain, unspecified: Secondary | ICD-10-CM

## 2021-10-20 DIAGNOSIS — M542 Cervicalgia: Secondary | ICD-10-CM | POA: Diagnosis not present

## 2021-10-20 DIAGNOSIS — M546 Pain in thoracic spine: Secondary | ICD-10-CM | POA: Diagnosis not present
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
## 2021-10-20 NOTE — Telephone Encounter (Signed)
Patient was referred to [facility] for MRI but they do not accept her insurance. Patient requesting referral to another MRI facility that will accept her insurance.

## 2021-10-20 NOTE — Telephone Encounter (Signed)
Referral was sent to [facility] facility

## 2021-11-03 ENCOUNTER — Telehealth: Payer: Self-pay | Admitting: Orthopaedic Surgery

## 2021-11-03 NOTE — Telephone Encounter (Signed)
Called patient left message on voicemail to return call to schedule an appointment for MRI review with Dr. Durward Fortes

## 2021-11-05 ENCOUNTER — Ambulatory Visit (HOSPITAL_COMMUNITY)
Admission: RE | Admit: 2021-11-05 | Discharge: 2021-11-05 | Disposition: A | Discharge status: Home / Self Care | Payer: 59 | Source: Ambulatory Visit | Attending: Physician Assistant | Admitting: Physician Assistant

## 2021-11-05 ENCOUNTER — Other Ambulatory Visit: Payer: Self-pay

## 2021-11-05 DIAGNOSIS — M546 Pain in thoracic spine: Secondary | ICD-10-CM | POA: Insufficient documentation

## 2021-11-05 DIAGNOSIS — M542 Cervicalgia: Secondary | ICD-10-CM | POA: Insufficient documentation

## 2021-11-05 IMAGING — MR MR CERVICAL SPINE W/O CM
5 series · 37 of 48 positions shown · non-contrast
Comparison: Radiographs of the cervical spine [DATE] (images
available, report unavailable). CT of the cervical spine [DATE].

CLINICAL DATA: Neck pain. Cervical radiculopathy, no red flags.
Additional history provided by scanning technologist: Patient
reports neck pain, bilateral upper extremity weakness/numbness.

EXAM:
MRI CERVICAL SPINE WITHOUT CONTRAST
TECHNIQUE: Multiplanar, multisequence MR imaging of the cervical spine was
performed. No intravenous contrast was administered.

[Series 5: T1 · sagittal · 3.0mm · 0.69mm/px · 6 of 15 slices shown]
[im 1/15]
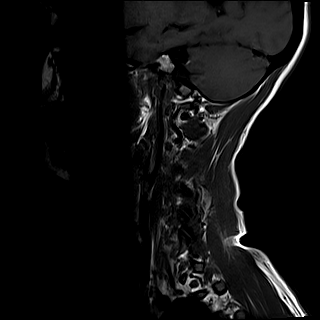
[im 3/15]
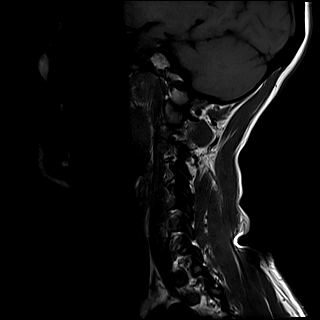
[im 6/15]
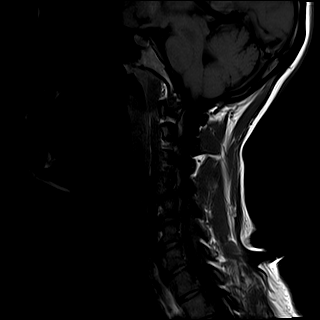
[im 9/15]
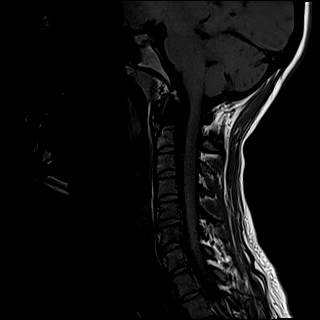
[im 12/15]
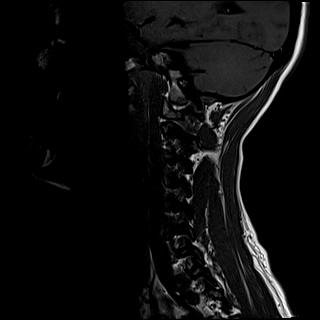
[im 15/15]
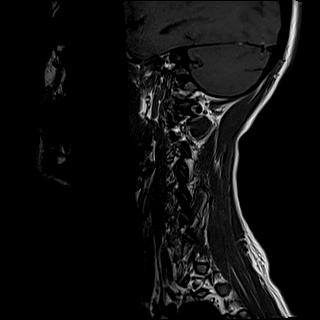

[Series 6: T2 · sagittal · 3.0mm · 0.69mm/px · 7 of 15 slices shown (1 of 2)]
[im 1/15]
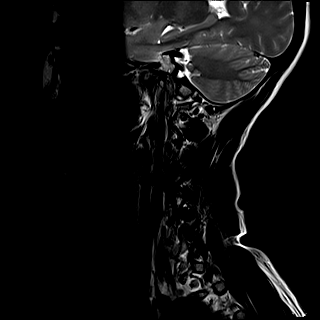
[im 3/15]
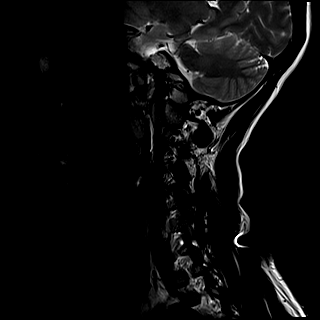
[im 5/15]
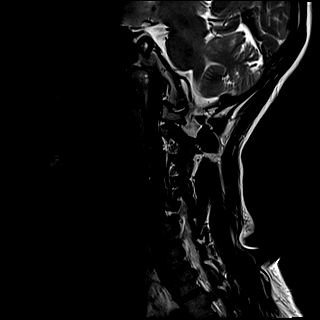
[im 8/15]
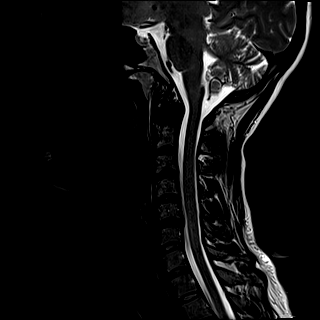
[im 10/15]
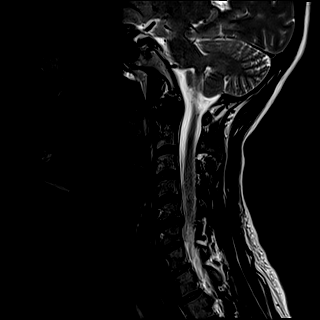
[im 12/15]
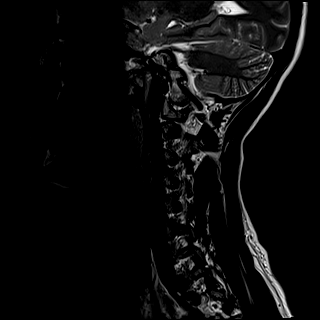
[im 15/15]
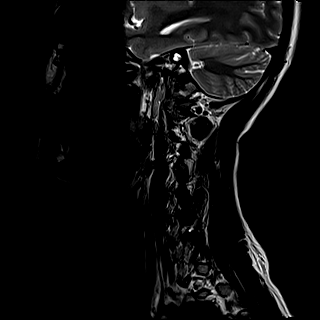

[Series 7: STIR · sagittal · 3.0mm · 0.86mm/px · 7 of 15 slices shown]
[im 1/15]
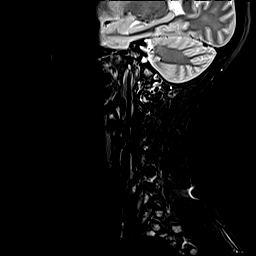
[im 3/15]
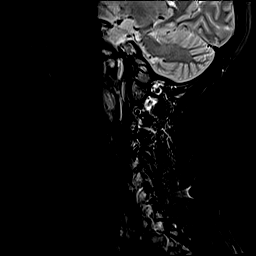
[im 5/15]
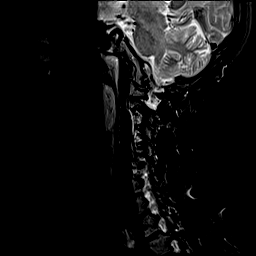
[im 8/15]
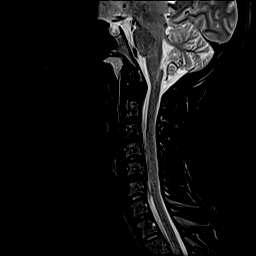
[im 10/15]
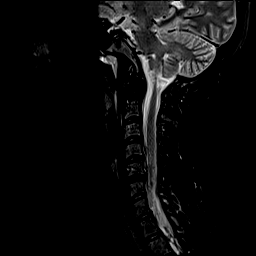
[im 12/15]
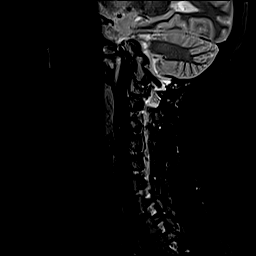
[im 15/15]
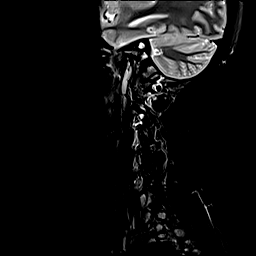

[Series 8: T2 · axial · 3.0mm · 0.70mm/px · z∈[-43,+55]mm · 9 of 30 slices shown (2 of 2)]
[im 1/30]
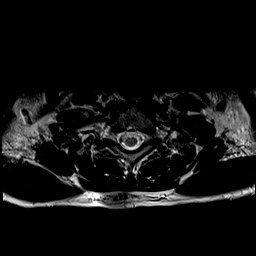
[im 3/30]
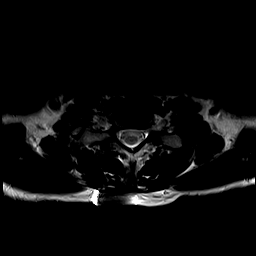
[im 5/30]
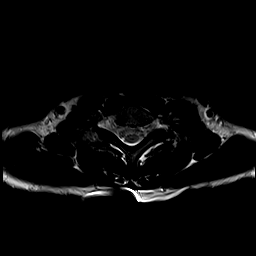
[im 9/30]
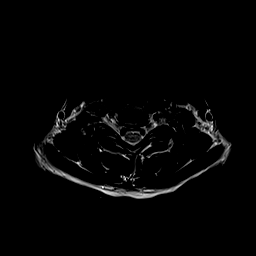
[im 14/30]
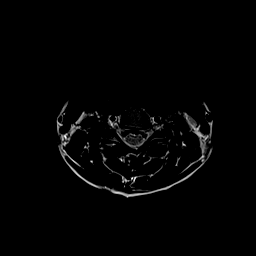
[im 16/30]
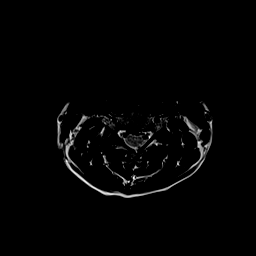
[im 21/30]
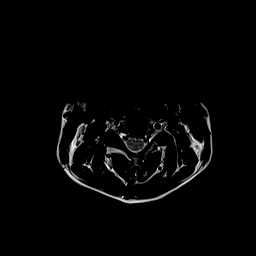
[im 25/30]
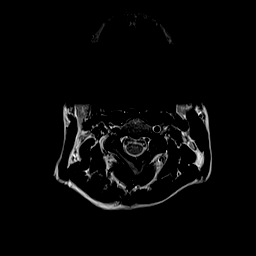
[im 30/30]
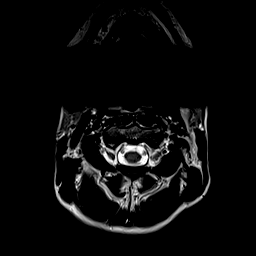

[Series 9: GRE · axial · 3.0mm · 0.35mm/px · z∈[-43,+55]mm · 8 of 30 slices shown]
[im 1/30]
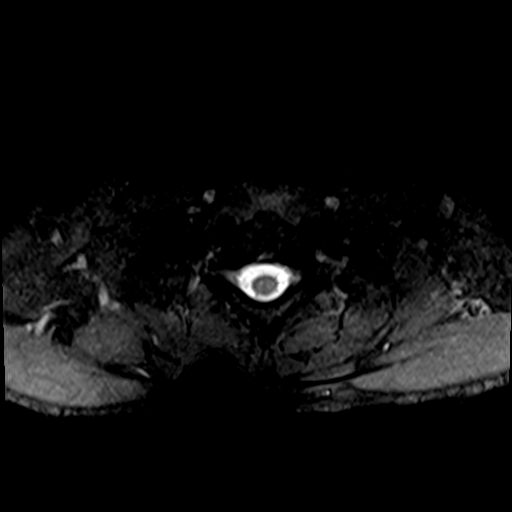
[im 5/30]
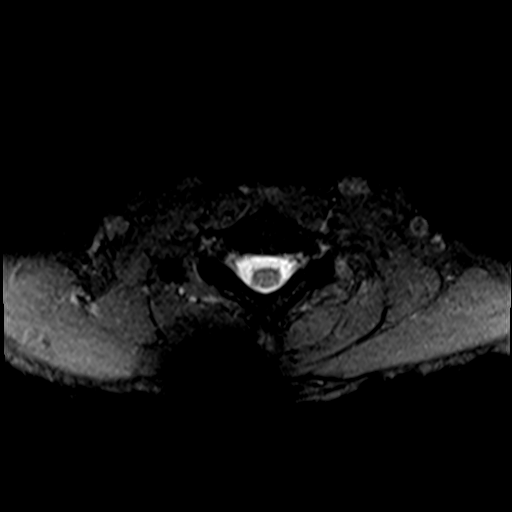
[im 9/30]
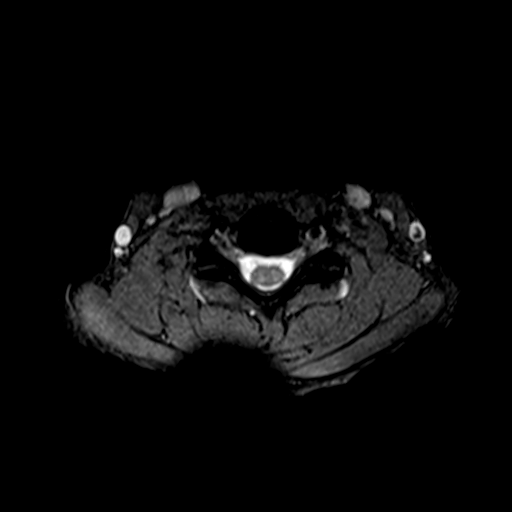
[im 14/30]
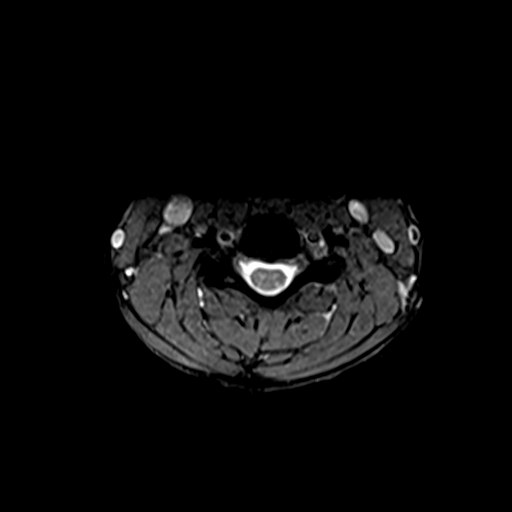
[im 16/30]
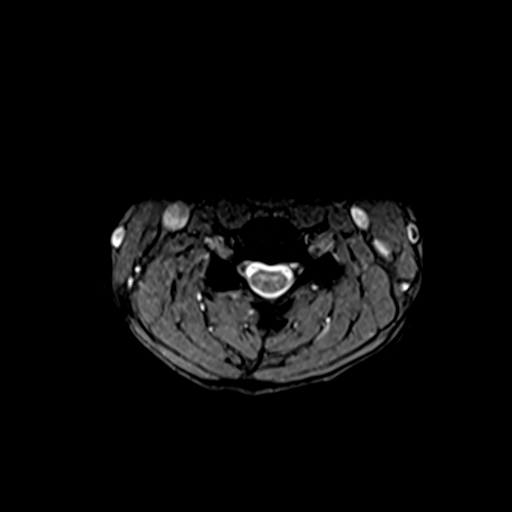
[im 21/30]
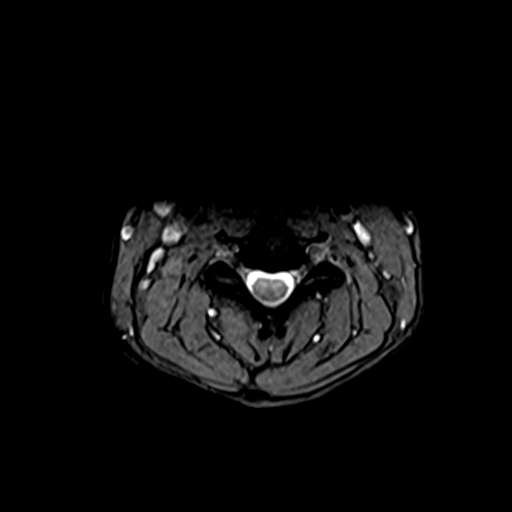
[im 25/30]
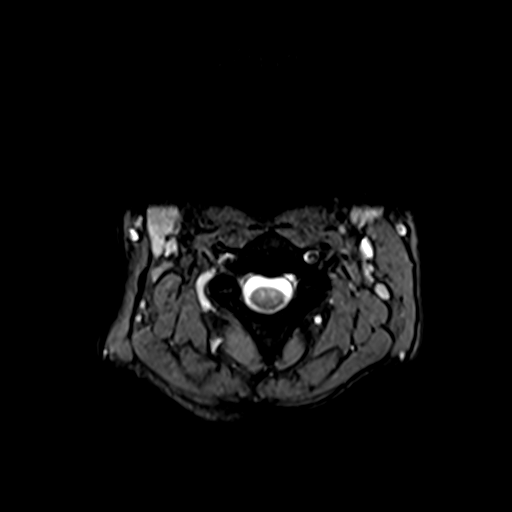
[im 30/30]
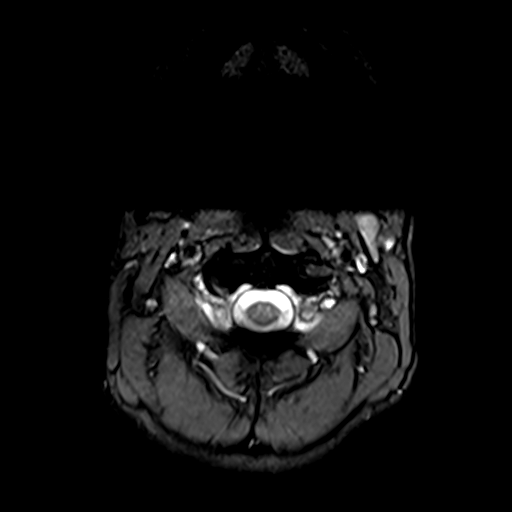

[37 of 48 positions shown; findings below may reference images not displayed]

FINDINGS: Alignment: Straightening of the expected cervical lordosis. No
significant spondylolisthesis.

Vertebrae: Vertebral body height is maintained. No significant
marrow edema or focal suspicious osseous lesion.

Cord: No signal abnormality identified within the cervical spinal
cord.

Posterior Fossa, vertebral arteries, paraspinal tissues: No
abnormality identified within included portions of the posterior
fossa. Flow voids preserved within the imaged cervical vertebral
arteries. Paraspinal soft tissues unremarkable.

Disc levels:

Mild multilevel disc degeneration, greatest at C5-C6.

C2-C3: No significant disc herniation or stenosis.

C3-C4: Slight disc bulge. No significant spinal canal or foraminal
stenosis.

C4-C5: Slight disc bulge. Mild bilateral uncovertebral hypertrophy.
Facet arthrosis on the right. No significant spinal canal stenosis.
Mild right neural foraminal narrowing.

C5-C6: Disc bulge with bilateral uncovertebral hypertrophy.
Superimposed shallow broad-based central disc protrusion. Mild
partial effacement of the ventral thecal sac (without spinal cord
mass effect). No significant foraminal stenosis.

C6-C7: No significant disc herniation or stenosis.

C7-T1: No significant disc herniation or stenosis.
IMPRESSION: Cervical spondylosis, as outlined and with findings most notably as
follows.

At C5-C6, there is mild disc degeneration. Disc bulge with bilateral
uncovertebral hypertrophy. Superimposed shallow broad-based central
disc protrusion. Mild relative spinal canal narrowing, without
spinal cord mass effect. No significant foraminal stenosis.

No significant spinal canal stenosis at the remaining levels.

Multifactorial mild neural foraminal narrowing on the right at
C4-C5.

Straightening of the expected cervical lordosis.

## 2021-11-05 IMAGING — MR MR THORACIC SPINE W/O CM
6 series · 34 of 48 positions shown · non-contrast
Comparison: Report from chest CT [DATE] (images unavailable).

CLINICAL DATA: Pain in thoracic spine. Congenital anomaly,
thoracic. Additional history provided by scanning technologist:
Patient reports neck pain, bilateral upper extremity weakness and
numbness.

EXAM:
MRI THORACIC SPINE WITHOUT CONTRAST
TECHNIQUE: Multiplanar, multisequence MR imaging of the thoracic spine was
performed. No intravenous contrast was administered.

[Series 20: T1 · sagittal · 4.0mm · 1.72mm/px · 1 of 5 slices shown (1 of 2)]
[im 1/5]
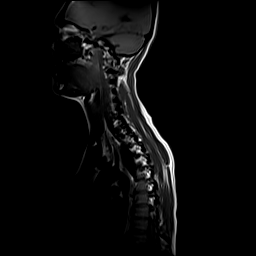

[Series 21: STIR · sagittal · 3.0mm · 1.00mm/px · 5 of 15 slices shown]
[im 1/15]
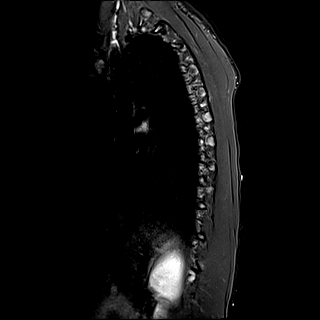
[im 4/15]
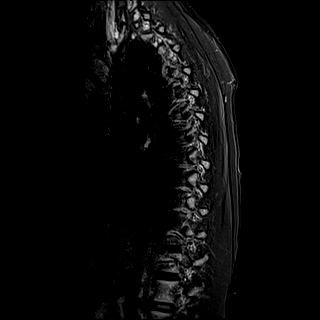
[im 8/15]
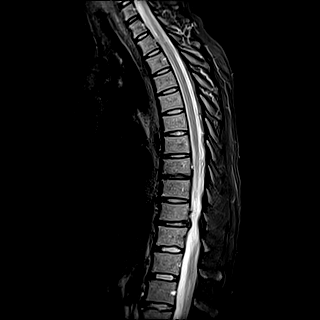
[im 11/15]
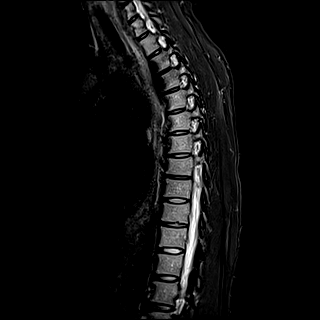
[im 15/15]
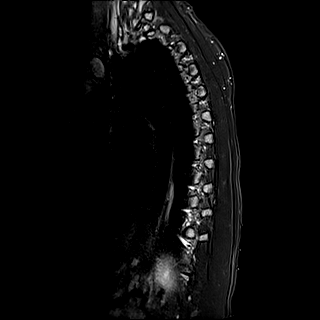

[Series 22: T1 · sagittal · 3.0mm · 1.00mm/px · 6 of 15 slices shown (2 of 2)]
[im 1/15]
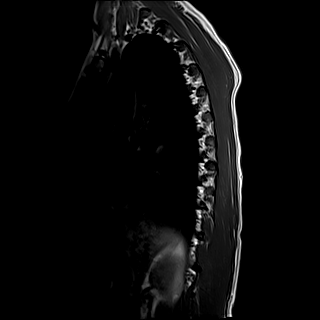
[im 3/15]
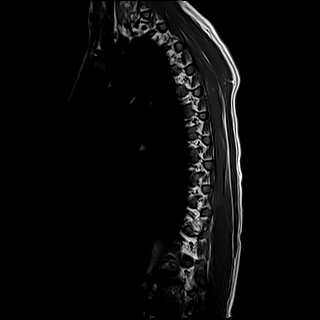
[im 6/15]
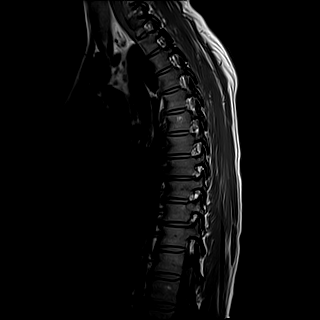
[im 9/15]
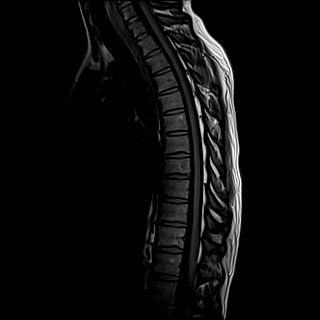
[im 12/15]
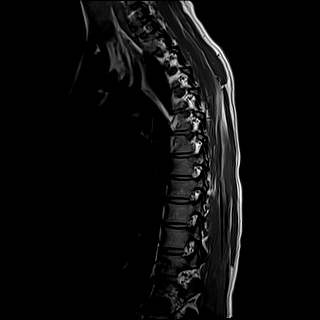
[im 15/15]
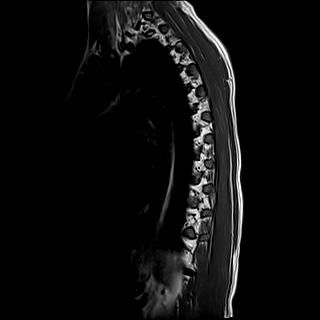

[Series 23: T2 · sagittal · 3.0mm · 0.83mm/px · 6 of 15 slices shown (1 of 2)]
[im 1/15]
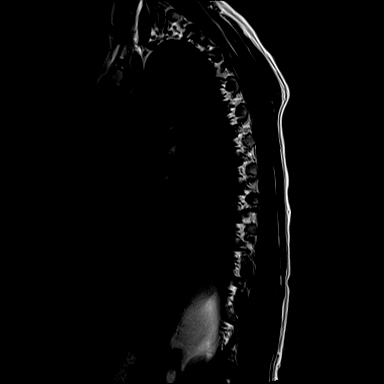
[im 3/15]
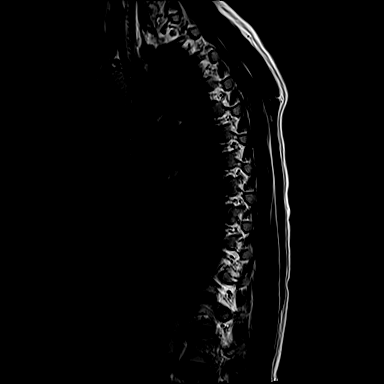
[im 6/15]
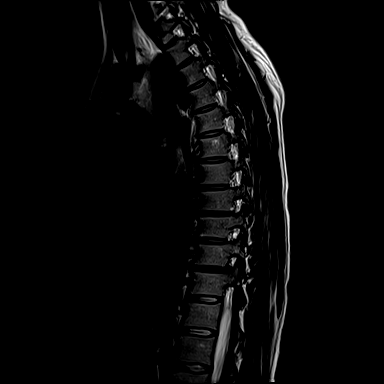
[im 9/15]
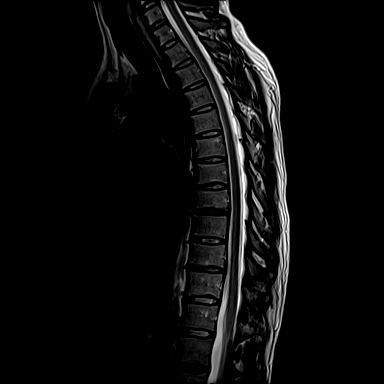
[im 12/15]
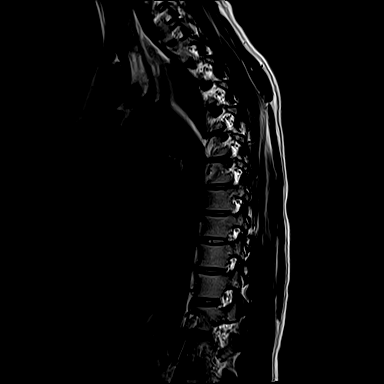
[im 15/15]
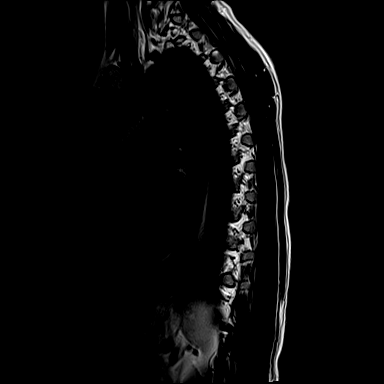

[Series 24: T2 · axial · 4.0mm · 0.78mm/px · z∈[-266,-57]mm · 9 of 39 slices shown (2 of 2)]
[im 1/39]
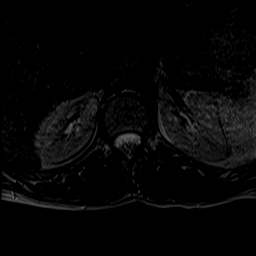
[im 6/39]
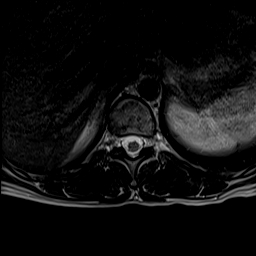
[im 11/39]
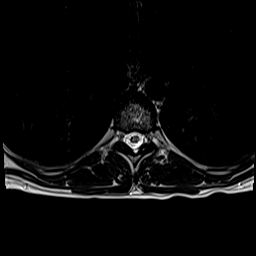
[im 17/39]
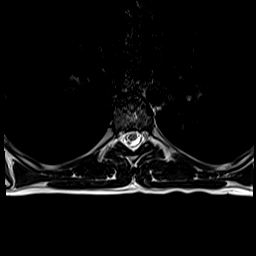
[im 20/39]
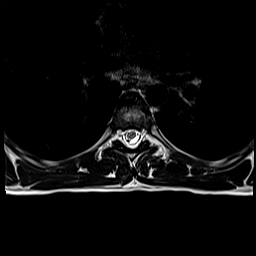
[im 22/39]
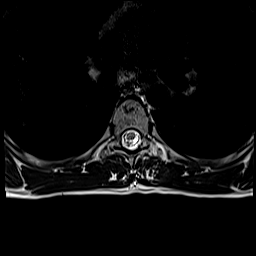
[im 28/39]
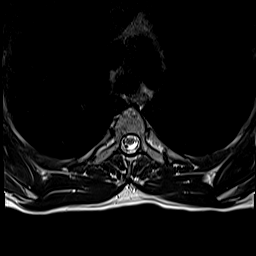
[im 33/39]
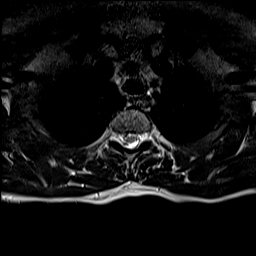
[im 39/39]
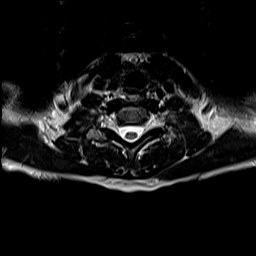

[Series 25: t2_me2d_tra · axial · 4.0mm · 0.39mm/px · z∈[-266,-90]mm · 7 of 39 slices shown]
[im 1/39]
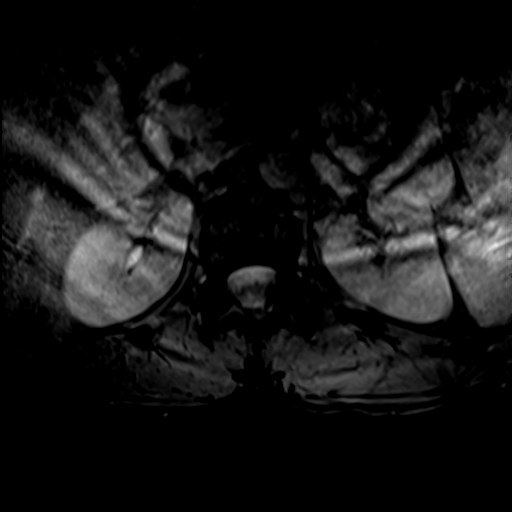
[im 6/39]
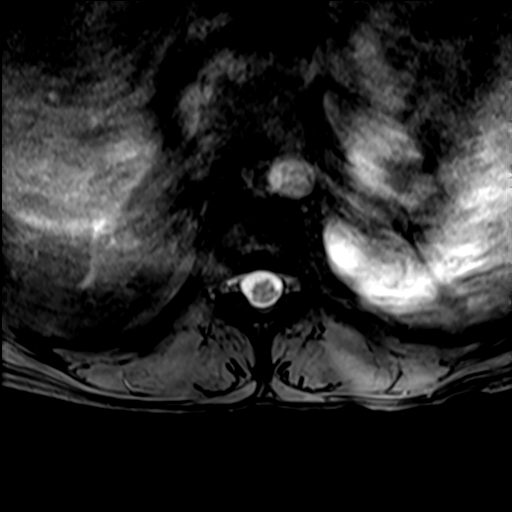
[im 11/39]
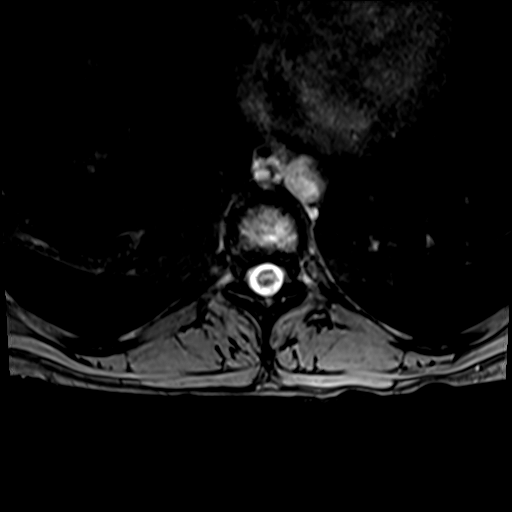
[im 17/39]
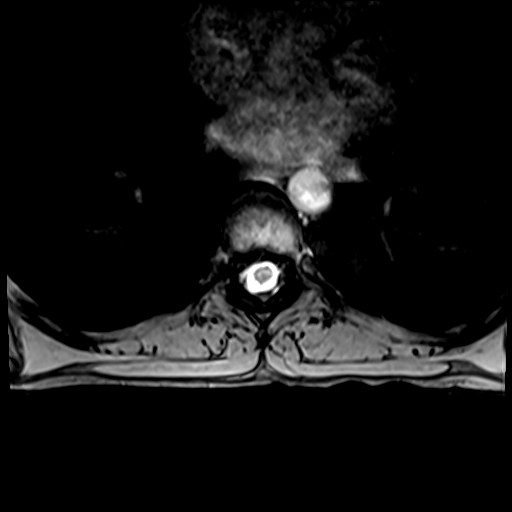
[im 22/39]
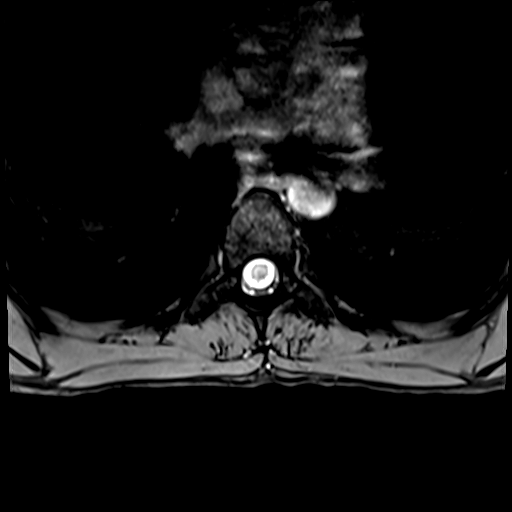
[im 28/39]
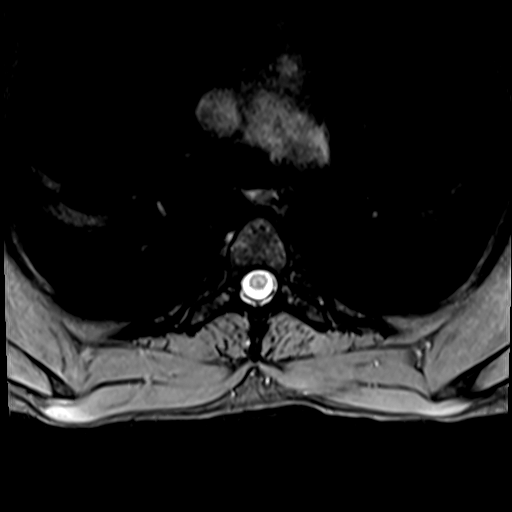
[im 33/39]
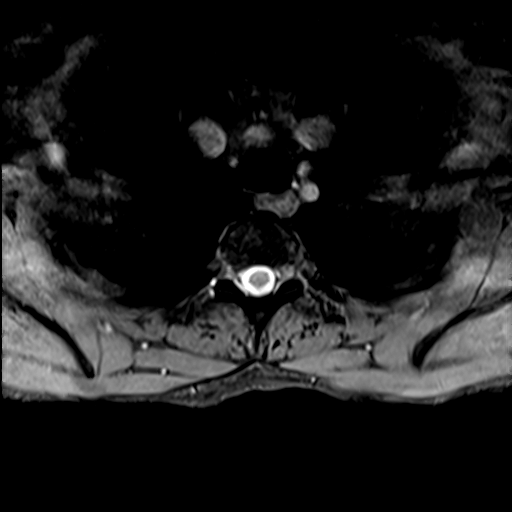

[34 of 48 positions shown; findings below may reference images not displayed]

FINDINGS: Mild intermittent motion degradation.

Alignment:  No significant spondylolisthesis.

Vertebrae: No thoracic vertebral compression fracture. Degenerative
endplate irregularity at T8-T9 and T10-T11. No significant marrow
edema or focal suspicious osseous lesion is identified.

Cord: No signal abnormality identified within the thoracic spinal
cord.

Paraspinal and other soft tissues: No abnormality identified within
included portions of the thorax or upper abdomen/retroperitoneum.
Paraspinal soft tissues unremarkable.

Disc levels:

Mild disc degeneration at T8-T9 and T10-T11.

At T10-T11, there is a small right center/foraminal disc protrusion.
The disc protrusion focally effaces the right ventral thecal sac,
without spinal cord mass effect. The disc protrusion also mildly
narrows the right neural foramen (series 23, image 6).

No significant disc herniation, spinal canal stenosis or neural
foraminal narrowing at the remaining levels.
IMPRESSION: Mildly motion degraded exam.

Thoracic spondylosis, as outlined and most notably as follows.

At T10-T11, there is mild disc degeneration. A small right
center/foraminal disc protrusion focally effaces the ventral thecal
sac, without spinal cord mass effect. The disc protrusion also
minimally narrows the right neural foramen.

No significant disc herniation, spinal canal stenosis or neural
foraminal narrowing at the remaining levels.

## 2021-11-09 ENCOUNTER — Other Ambulatory Visit: Payer: Self-pay

## 2021-11-09 ENCOUNTER — Ambulatory Visit
Admission: RE | Admit: 2021-11-09 | Discharge: 2021-11-09 | Disposition: A | Discharge status: Other Acute Inpatient Hospital | Payer: 59 | Source: Ambulatory Visit | Attending: Gastroenterology | Admitting: Gastroenterology

## 2021-11-09 ENCOUNTER — Encounter: Payer: Self-pay | Admitting: Orthopaedic Surgery

## 2021-11-09 ENCOUNTER — Ambulatory Visit (INDEPENDENT_AMBULATORY_CARE_PROVIDER_SITE_OTHER): Payer: 59 | Admitting: Orthopaedic Surgery

## 2021-11-09 DIAGNOSIS — M542 Cervicalgia: Secondary | ICD-10-CM
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
## 2021-11-10 ENCOUNTER — Ambulatory Visit
Admission: EM | Admit: 2021-11-10 | Discharge: 2021-11-10 | Disposition: A | Discharge status: Home / Self Care | Payer: 59 | Attending: Emergency Medicine | Admitting: Emergency Medicine

## 2021-11-10 VITALS — BP 131/87 | HR 64 | Temp 99.2°F | Resp 20

## 2021-11-10 DIAGNOSIS — F17200 Nicotine dependence, unspecified, uncomplicated: Secondary | ICD-10-CM | POA: Diagnosis not present

## 2021-11-10 DIAGNOSIS — J0141 Acute recurrent pansinusitis: Secondary | ICD-10-CM | POA: Diagnosis not present

## 2021-11-10 MED ORDER — TERCONAZOLE 0.4 % VA CREA: TOPICAL_CREAM | VAGINAL | 0 refills | Status: DC

## 2021-11-10 MED ORDER — AMOXICILLIN-POT CLAVULANATE 875-125 MG PO TABS: 1.0000 | ORAL_TABLET | Freq: Two times a day (BID) | ORAL | 0 refills | Status: AC

## 2021-11-10 MED ORDER — FLUCONAZOLE 150 MG PO TABS: ORAL_TABLET | ORAL | 0 refills | Status: DC
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
## 2021-11-10 NOTE — Discharge Instructions (Addendum)
For please begin Augmentin 1 tablet twice daily for 14 days.  Diflucan prescription has been provided for vaginal yeast infection, I have also provided you with a prescription for terconazole cream which is very helpful to relieve external symptoms.

## 2021-11-10 NOTE — ED Triage Notes (Signed)
Pt reports having sinus pressure, headache and congestion.  Started: 5 days ago

## 2021-11-11 ENCOUNTER — Ambulatory Visit: Payer: 59 | Admitting: Physical Medicine and Rehabilitation

## 2021-11-12 ENCOUNTER — Telehealth: Payer: Self-pay | Admitting: Physical Medicine and Rehabilitation

## 2021-11-12 NOTE — Telephone Encounter (Signed)
Patient called needing to reschedule her appointment   The number to contact patient is [phone]

## 2021-11-17 ENCOUNTER — Ambulatory Visit: Payer: 59 | Admitting: Physical Medicine and Rehabilitation

## 2021-11-23 ENCOUNTER — Ambulatory Visit (INDEPENDENT_AMBULATORY_CARE_PROVIDER_SITE_OTHER): Payer: 59 | Admitting: Physical Medicine and Rehabilitation

## 2021-11-23 ENCOUNTER — Encounter: Payer: Self-pay | Admitting: Physical Medicine and Rehabilitation

## 2021-11-23 ENCOUNTER — Other Ambulatory Visit: Payer: Self-pay

## 2021-11-23 VITALS — BP 128/76 | HR 91

## 2021-11-23 DIAGNOSIS — M5000 Cervical disc disorder with myelopathy, unspecified cervical region: Secondary | ICD-10-CM

## 2021-11-23 DIAGNOSIS — M542 Cervicalgia: Secondary | ICD-10-CM

## 2021-11-23 DIAGNOSIS — M4722 Other spondylosis with radiculopathy, cervical region: Secondary | ICD-10-CM | POA: Diagnosis not present

## 2021-11-23 DIAGNOSIS — M5412 Radiculopathy, cervical region: Secondary | ICD-10-CM

## 2021-11-23 NOTE — Progress Notes (Signed)
Pt state neck pain that travels to her left shoulder and arm. Pt state the pain goes down to her left hip and side. Pt state bending over, driving and sleeping makes the pain worse. Pt state sitting cause discomfort also. Pt state she takes pain meds to help ease her pain.  Numeric Pain Rating Scale and Functional Assessment Average Pain 8 Pain Right Now 6 My pain is constant, sharp, dull, stabbing, tingling, and aching Pain is worse with: bending, sitting, standing, and some activites Pain improves with: therapy/exercise, medication, and TENS   In the last MONTH (on 0-10 scale) has pain interfered with the following?  1. General activity like being  able to carry out your everyday physical activities such as walking, climbing stairs, carrying groceries, or moving a chair?  Rating(7)  2. Relation with others like being able to carry out your usual social activities and roles such as  activities at home, at work and in your community. Rating(8)  3. Enjoyment of life such that you have  been bothered by emotional problems such as feeling anxious, depressed or irritable?  Rating(9)
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
## 2021-12-02 ENCOUNTER — Telehealth: Payer: Self-pay | Admitting: Physical Medicine and Rehabilitation

## 2021-12-02 NOTE — Telephone Encounter (Signed)
error 

## 2021-12-02 NOTE — Telephone Encounter (Signed)
Patient following up to check if her insurance approved injection. Please f/u

## 2021-12-07 ENCOUNTER — Encounter: Payer: Self-pay | Admitting: *Deleted

## 2021-12-09 ENCOUNTER — Other Ambulatory Visit: Payer: Self-pay

## 2021-12-09 ENCOUNTER — Encounter (HOSPITAL_COMMUNITY): Payer: Self-pay

## 2021-12-09 ENCOUNTER — Ambulatory Visit (HOSPITAL_COMMUNITY)
Admission: RE | Admit: 2021-12-09 | Discharge: 2021-12-09 | Disposition: A | Discharge status: Home / Self Care | Payer: 59 | Source: Ambulatory Visit | Attending: Emergency Medicine | Admitting: Emergency Medicine

## 2021-12-09 VITALS — BP 132/91 | HR 83 | Temp 98.0°F | Resp 20

## 2021-12-09 DIAGNOSIS — R300 Dysuria: Secondary | ICD-10-CM | POA: Insufficient documentation

## 2021-12-09 DIAGNOSIS — N898 Other specified noninflammatory disorders of vagina: Secondary | ICD-10-CM | POA: Insufficient documentation

## 2021-12-09 LAB — POCT URINALYSIS DIPSTICK, ED / UC
Bilirubin Urine: NEGATIVE
Glucose, UA: NEGATIVE mg/dL
Ketones, ur: NEGATIVE mg/dL
Leukocytes,Ua: NEGATIVE
Nitrite: NEGATIVE
Protein, ur: NEGATIVE mg/dL
Specific Gravity, Urine: 1.02 (ref 1.005–1.030)
Urobilinogen, UA: 0.2 mg/dL (ref 0.0–1.0)
pH: 6.5 (ref 5.0–8.0)

## 2021-12-09 NOTE — Discharge Instructions (Signed)
Urinalysis negative for infection   Labs pending 2-3 days, you will be contacted if positive for any sti and treatment will be sent to the pharmacy, you will have to return to the clinic if positive for gonorrhea to receive treatment   Please refrain from having sex until labs results, if positive please refrain from having sex until treatment complete and symptoms resolve   If positive forChlamydia  gonorrhea or trichomoniasis please notify partner or partners so they may tested as well  Moving forward, it is recommended you use some form of protection against the transmission of sti infections  such as condoms or dental dams with each sexual encounter

## 2021-12-09 NOTE — ED Triage Notes (Signed)
PT reports dysuria and slightly increased vaginal discharge, new sexual partner.
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
## 2021-12-10 ENCOUNTER — Ambulatory Visit (INDEPENDENT_AMBULATORY_CARE_PROVIDER_SITE_OTHER): Payer: 59 | Admitting: Gastroenterology

## 2021-12-10 ENCOUNTER — Encounter: Payer: Self-pay | Admitting: Gastroenterology

## 2021-12-10 VITALS — BP 104/62 | HR 64 | Ht 64.0 in | Wt 138.0 lb

## 2021-12-10 DIAGNOSIS — R1030 Lower abdominal pain, unspecified: Secondary | ICD-10-CM

## 2021-12-10 LAB — CERVICOVAGINAL ANCILLARY ONLY
Bacterial Vaginitis (gardnerella): POSITIVE — AB
Candida Glabrata: NEGATIVE
Candida Vaginitis: NEGATIVE
Chlamydia: NEGATIVE
Comment: NEGATIVE
Comment: NEGATIVE
Comment: NEGATIVE
Comment: NEGATIVE
Comment: NEGATIVE
Comment: NORMAL
Neisseria Gonorrhea: NEGATIVE
Trichomonas: NEGATIVE

## 2021-12-10 MED ORDER — FAMOTIDINE 20 MG PO TABS: 20.0000 mg | ORAL_TABLET | Freq: Two times a day (BID) | ORAL | 11 refills | Status: DC

## 2021-12-10 MED ORDER — ONDANSETRON 4 MG PO TBDP: ORAL_TABLET | ORAL | 1 refills | Status: DC

## 2021-12-10 MED ORDER — DICYCLOMINE HCL 10 MG PO CAPS: 20.0000 mg | ORAL_CAPSULE | Freq: Four times a day (QID) | ORAL | 11 refills | Status: DC | PRN

## 2021-12-10 NOTE — Patient Instructions (Addendum)
It was a pleasure to see you today.  I did not change your medications but we refilled them.  I recommend that you use the famotidine twice daily as needed for breakthrough symptoms. I would save the Zofran for when the famotidine doesn't work. I would like to control your symptoms without the Zofran over time for safety.   Congrats on your new job!  I'd like to see you in 3-6 months, or earlier if needed.   If you are age 32 or older, your body mass index should be between 23-30. Your Body mass index is 23.69 kg/m. If this is out of the aforementioned range listed, please consider follow up with your Primary Care Provider.  If you are age 49 or younger, your body mass index should be between 19-25. Your Body mass index is 23.69 kg/m. If this is out of the aformentioned range listed, please consider follow up with your Primary Care Provider.   ________________________________________________________  The [facility] providers would like to encourage you to use [facility] to communicate with providers for non-urgent requests or questions.  Due to long hold times on the telephone, sending your provider a message by [facility] may be a faster and more efficient way to get a response.  Please allow 48 business hours for a response.  Please remember that this is for non-urgent requests.  _______________________________________________________  Due to recent changes in healthcare laws, you may see the results of your imaging and laboratory studies on MyChart before your provider has had a chance to review them.  We understand that in some cases there may be results that are confusing or concerning to you. Not all laboratory results come back in the same time frame and the provider may be waiting for multiple results in order to interpret others.  Please give [place] 48 hours in order for your provider to thoroughly review all the results before contacting the office for clarification of your results.
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
## 2021-12-13 ENCOUNTER — Telehealth (HOSPITAL_COMMUNITY): Payer: Self-pay | Admitting: Emergency Medicine

## 2021-12-13 MED ORDER — METRONIDAZOLE 500 MG PO TABS: 500.0000 mg | ORAL_TABLET | Freq: Two times a day (BID) | ORAL | 0 refills | Status: DC

## 2021-12-15 ENCOUNTER — Other Ambulatory Visit: Payer: Self-pay

## 2021-12-15 ENCOUNTER — Emergency Department (HOSPITAL_COMMUNITY)
Admission: EM | Admit: 2021-12-15 | Discharge: 2021-12-15 | Disposition: A | Discharge status: Home / Self Care | Payer: 59 | Source: Home / Self Care

## 2021-12-15 ENCOUNTER — Encounter (HOSPITAL_COMMUNITY): Payer: Self-pay

## 2021-12-15 ENCOUNTER — Emergency Department (HOSPITAL_COMMUNITY)
Admission: EM | Admit: 2021-12-15 | Discharge: 2021-12-15 | Disposition: A | Discharge status: Home / Self Care | Payer: 59 | Attending: Emergency Medicine | Admitting: Emergency Medicine

## 2021-12-15 DIAGNOSIS — R109 Unspecified abdominal pain: Secondary | ICD-10-CM | POA: Insufficient documentation

## 2021-12-15 DIAGNOSIS — Z5321 Procedure and treatment not carried out due to patient leaving prior to being seen by health care provider: Secondary | ICD-10-CM | POA: Insufficient documentation

## 2021-12-15 NOTE — ED Notes (Signed)
Pt is in the lobby screaming at registration.

## 2021-12-15 NOTE — ED Notes (Signed)
Pt left the facility after cursing at staff.

## 2021-12-15 NOTE — ED Notes (Signed)
Pt refusing lab draws.

## 2021-12-15 NOTE — ED Notes (Signed)
Pt states that she does not want a urine drug screen and that "we do this shit every time to her".

## 2021-12-15 NOTE — ED Notes (Signed)
Pt threw her empty pill boxes at me (they landed on the counter) stating "this is what I've taken".

## 2021-12-15 NOTE — ED Triage Notes (Signed)
Pt states that she has had abdominal pain since 11 am yesterday.

## 2021-12-15 NOTE — ED Triage Notes (Signed)
Pt BIB EMS. Pt states that she has had abdominal pain since 11 am yesterday. Pt is cursing at staff.

## 2021-12-15 NOTE — ED Notes (Signed)
Pt came out of room slamming door and cursing at staff. RN asked if she was leaving and pt stated, "yes - I dont know why yall wont just f*cking help me!" Pt slammed triage doors and stormed out of triage area.

## 2021-12-16 ENCOUNTER — Telehealth: Payer: Self-pay | Admitting: Physical Medicine and Rehabilitation

## 2021-12-16 ENCOUNTER — Ambulatory Visit: Payer: 59 | Admitting: Physical Medicine and Rehabilitation

## 2021-12-16 NOTE — Telephone Encounter (Signed)
Pt returned call to [facility]. Pt phone number is [phone].

## 2021-12-18 ENCOUNTER — Other Ambulatory Visit: Payer: Self-pay

## 2021-12-18 ENCOUNTER — Encounter: Payer: Self-pay | Admitting: Emergency Medicine

## 2021-12-18 ENCOUNTER — Ambulatory Visit: Admit: 2021-12-18 | Disposition: A | Discharge status: Home / Self Care | Payer: 59

## 2021-12-18 ENCOUNTER — Ambulatory Visit
Admission: EM | Admit: 2021-12-18 | Discharge: 2021-12-18 | Disposition: A | Discharge status: Home / Self Care | Payer: 59 | Attending: Physician Assistant | Admitting: Physician Assistant

## 2021-12-18 DIAGNOSIS — N946 Dysmenorrhea, unspecified: Secondary | ICD-10-CM

## 2021-12-18 MED ORDER — KETOROLAC TROMETHAMINE 60 MG/2ML IM SOLN
60.0000 mg | Freq: Once | INTRAMUSCULAR | Status: AC
  Administered 2021-12-18: 60 mg via INTRAMUSCULAR

## 2021-12-18 NOTE — ED Triage Notes (Signed)
Pt here for lower abd pain and cramping when starting period every month with N/V; pt sts started 2 days ago

## 2021-12-19 ENCOUNTER — Encounter: Payer: Self-pay | Admitting: Physician Assistant
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
## 2021-12-28 ENCOUNTER — Ambulatory Visit (INDEPENDENT_AMBULATORY_CARE_PROVIDER_SITE_OTHER): Payer: 59 | Admitting: Physical Medicine and Rehabilitation

## 2021-12-28 ENCOUNTER — Encounter: Payer: Self-pay | Admitting: Physical Medicine and Rehabilitation

## 2021-12-28 ENCOUNTER — Other Ambulatory Visit: Payer: Self-pay

## 2021-12-28 ENCOUNTER — Ambulatory Visit: Payer: Self-pay

## 2021-12-28 VITALS — BP 129/80 | HR 81

## 2021-12-28 DIAGNOSIS — M5412 Radiculopathy, cervical region: Secondary | ICD-10-CM | POA: Diagnosis not present

## 2021-12-28 MED ORDER — METHYLPREDNISOLONE ACETATE 80 MG/ML IJ SUSP
80.0000 mg | Freq: Once | INTRAMUSCULAR | Status: AC
  Administered 2021-12-28: 80 mg

## 2021-12-28 MED ORDER — GABAPENTIN 300 MG PO CAPS: 300.0000 mg | ORAL_CAPSULE | Freq: Three times a day (TID) | ORAL | 3 refills | Status: DC

## 2021-12-28 NOTE — Progress Notes (Signed)
Pt state neck pain that travels to her right shoulder. Pt state any movement makes the pain worse. Pt state she takes over the counter pain meds, uses heat /ice and a tens unit to help ease her pain.  Numeric Pain Rating Scale and Functional Assessment Average Pain 7   In the last MONTH (on 0-10 scale) has pain interfered with the following?  1. General activity like being  able to carry out your everyday physical activities such as walking, climbing stairs, carrying groceries, or moving a chair?  Rating(10)   +Driver, -BT, -Dye Allergies.

## 2021-12-28 NOTE — Patient Instructions (Signed)

## 2021-12-29 NOTE — Procedures (Signed)
Cervical Epidural Steroid Injection - Interlaminar Approach with Fluoroscopic Guidance  Patient: Gina Dorsey      Date of Birth: [birth year] MRN: [id number] PCP: Thornton Park, MD      Visit Date: 12/28/2021   Universal Protocol:    Date/Time: 02/01/235:31 AM  Consent Given By: the patient  Position: PRONE  Additional Comments: Vital signs were monitored before and after the procedure. Patient was prepped and draped in the usual sterile fashion. The correct patient, procedure, and site was verified.   Injection Procedure Details:   Procedure diagnoses: Radiculopathy, cervical region [M54.12]    Meds Administered:  Meds ordered this encounter  Medications   methylPREDNISolone acetate (DEPO-MEDROL) injection 80 mg   gabapentin (NEURONTIN) 300 MG capsule    Sig: Take 1 capsule (300 mg total) by mouth 3 (three) times daily.    Dispense:  90 capsule    Refill:  3     Laterality: Left  Location/Site: C7-T1  Needle: 3.5 in., 20 ga. Tuohy  Needle Placement: Paramedian epidural space  Findings:  -Comments: Excellent flow of contrast into the epidural space.  Procedure Details: Using a paramedian approach from the side mentioned above, the region overlying the inferior lamina was localized under fluoroscopic visualization and the soft tissues overlying this structure were infiltrated with 4 ml. of 1% Lidocaine without Epinephrine. A # 20 gauge, Tuohy needle was inserted into the epidural space using a paramedian approach.  The epidural space was localized using loss of resistance along with contralateral oblique bi-planar fluoroscopic views.  After negative aspirate for air, blood, and CSF, a 2 ml. volume of Isovue-250 was injected into the epidural space and the flow of contrast was observed. Radiographs were obtained for documentation purposes.   The injectate was administered into the level noted above.  Additional Comments:  No complications occurred Dressing: 2  x 2 sterile gauze and Band-Aid    Post-procedure details: Patient was observed during the procedure. Post-procedure instructions were reviewed.  Patient left the clinic in stable condition.

## 2021-12-29 NOTE — Progress Notes (Signed)
Gina Dorsey - 31 y.o. female MRN [id number]  Date of birth: 1991-02-11  Office Visit Note: Visit Date: 12/28/2021 PCP: Thornton Park, MD Referred by: Thornton Park, MD  Subjective: Chief Complaint  Patient presents with   Neck - Pain   Left Shoulder - Pain   HPI:  Gina Dorsey is a 31 y.o. female who comes in today at the request of Barnet Pall, FNP for planned Left C7-T1 Cervical Interlaminar epidural steroid injection with fluoroscopic guidance.  The patient has failed conservative care including home exercise, medications, time and activity modification.  This injection will be diagnostic and hopefully therapeutic.  Please see requesting physician notes for further details and justification. MRI reviewed with images and spine model.  MRI reviewed in the note below.  Patient originally seen by Dr. Joni Fears.  She is asking for refill of gabapentin which I did provide today.  She probably should make sure she follows with her primary care physician Dr. Thornton Park for continued refills of this prescription.  She has multiple comorbidities of the behavioral health type nature and is also intolerant and allergic to morphine related pain medications.  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  ROS Otherwise per HPI.  Assessment & Plan: Visit Diagnoses:    ICD-10-CM   1. Radiculopathy, cervical region  M54.12 XR C-ARM NO REPORT    Epidural Steroid injection    methylPREDNISolone acetate (DEPO-MEDROL) injection 80 mg      Plan: No additional findings.   Meds & Orders:  Meds ordered this encounter  Medications   methylPREDNISolone acetate (DEPO-MEDROL) injection 80 mg   gabapentin (NEURONTIN) 300 MG capsule    Sig: Take 1 capsule (300 mg total) by mouth 3 (three) times daily.    Dispense:  90 capsule    Refill:  3    Orders Placed This Encounter  Procedures   XR C-ARM NO REPORT   Epidural Steroid injection    Follow-up:  Return if symptoms worsen or fail to improve.   Procedures: No procedures performed  Cervical Epidural Steroid Injection - Interlaminar Approach with Fluoroscopic Guidance  Patient: Gina Dorsey      Date of Birth: February 04, 1991 MRN: [id number] PCP: Thornton Park, MD      Visit Date: 12/28/2021   Universal Protocol:    Date/Time: 02/01/235:31 AM  Consent Given By: the patient  Position: PRONE  Additional Comments: Vital signs were monitored before and after the procedure. Patient was prepped and draped in the usual sterile fashion. The correct patient, procedure, and site was verified.   Injection Procedure Details:   Procedure diagnoses: Radiculopathy, cervical region [M54.12]    Meds Administered:  Meds ordered this encounter  Medications   methylPREDNISolone acetate (DEPO-MEDROL) injection 80 mg   gabapentin (NEURONTIN) 300 MG capsule    Sig: Take 1 capsule (300 mg total) by mouth 3 (three) times daily.    Dispense:  90 capsule    Refill:  3     Laterality: Left  Location/Site: C7-T1  Needle: 3.5 in., 20 ga. Tuohy  Needle Placement: Paramedian epidural space  Findings:  -Comments: Excellent flow of contrast into the epidural space.  Procedure Details: Using a paramedian approach from the side mentioned above, the region overlying the inferior lamina was localized under fluoroscopic visualization and the soft tissues overlying this structure were infiltrated with 4 ml. of 1% Lidocaine without Epinephrine. A # 20 gauge, Tuohy needle was inserted into the epidural space using a  paramedian approach.  The epidural space was localized using loss of resistance along with contralateral oblique bi-planar fluoroscopic views.  After negative aspirate for air, blood, and CSF, a 2 ml. volume of Isovue-250 was injected into the epidural space and the flow of contrast was observed. Radiographs were obtained for documentation purposes.   The injectate was administered into  the level noted above.  Additional Comments:  No complications occurred Dressing: 2 x 2 sterile gauze and Band-Aid    Post-procedure details: Patient was observed during the procedure. Post-procedure instructions were reviewed.  Patient left the clinic in stable condition.   Clinical History: MRI CERVICAL SPINE WITHOUT CONTRAST   TECHNIQUE: Multiplanar, multisequence MR imaging of the cervical spine was performed. No intravenous contrast was administered.   COMPARISON:  Radiographs of the cervical spine 10/20/2021 (images available, report unavailable). CT of the cervical spine 04/07/2021.   FINDINGS: Alignment: Straightening of the expected cervical lordosis. No significant spondylolisthesis.   Vertebrae: Vertebral body height is maintained. No significant marrow edema or focal suspicious osseous lesion.   Cord: No signal abnormality identified within the cervical spinal cord.   Posterior Fossa, vertebral arteries, paraspinal tissues: No abnormality identified within included portions of the posterior fossa. Flow voids preserved within the imaged cervical vertebral arteries. Paraspinal soft tissues unremarkable.   Disc levels:   Mild multilevel disc degeneration, greatest at C5-C6.   C2-C3: No significant disc herniation or stenosis.   C3-C4: Slight disc bulge. No significant spinal canal or foraminal stenosis.   C4-C5: Slight disc bulge. Mild bilateral uncovertebral hypertrophy. Facet arthrosis on the right. No significant spinal canal stenosis. Mild right neural foraminal narrowing.   C5-C6: Disc bulge with bilateral uncovertebral hypertrophy. Superimposed shallow broad-based central disc protrusion. Mild partial effacement of the ventral thecal sac (without spinal cord mass effect). No significant foraminal stenosis.   C6-C7: No significant disc herniation or stenosis.   C7-T1: No significant disc herniation or stenosis.   IMPRESSION: Cervical  spondylosis, as outlined and with findings most notably as follows.   At C5-C6, there is mild disc degeneration. Disc bulge with bilateral uncovertebral hypertrophy. Superimposed shallow broad-based central disc protrusion. Mild relative spinal canal narrowing, without spinal cord mass effect. No significant foraminal stenosis.   No significant spinal canal stenosis at the remaining levels.   Multifactorial mild neural foraminal narrowing on the right at C4-C5.   Straightening of the expected cervical lordosis.     Electronically Signed   By: Kellie Simmering D.O.   On: 11/05/2021 17:57     Objective:  VS:  HT:     WT:    BMI:      BP:129/80   HR:81bpm   TEMP: ( )   RESP:  Physical Exam Vitals and nursing note reviewed.  Constitutional:      General: She is not in acute distress.    Appearance: Normal appearance. She is not ill-appearing.  HENT:     Head: Normocephalic and atraumatic.     Right Ear: External ear normal.     Left Ear: External ear normal.  Eyes:     Extraocular Movements: Extraocular movements intact.  Cardiovascular:     Rate and Rhythm: Normal rate.     Pulses: Normal pulses.  Musculoskeletal:     Cervical back: Tenderness present. No rigidity.     Right lower leg: No edema.     Left lower leg: No edema.     Comments: Patient has good strength in the upper extremities including  5 out of 5 strength in wrist extension long finger flexion and APB.  There is no atrophy of the hands intrinsically.  There is a negative Hoffmann's test.   Lymphadenopathy:     Cervical: No cervical adenopathy.  Skin:    Findings: No erythema, lesion or rash.  Neurological:     General: No focal deficit present.     Mental Status: She is alert and oriented to person, place, and time.     Sensory: No sensory deficit.     Motor: No weakness or abnormal muscle tone.     Coordination: Coordination normal.  Psychiatric:        Mood and Affect: Mood normal.        Behavior:  Behavior normal.     Imaging: XR C-ARM NO REPORT  Result Date: 12/28/2021 Please see Notes tab for imaging impression.

## 2022-01-21 ENCOUNTER — Other Ambulatory Visit: Payer: Self-pay | Admitting: Nurse Practitioner

## 2022-01-30 ENCOUNTER — Other Ambulatory Visit: Payer: Self-pay | Admitting: Nurse Practitioner

## 2022-02-01 NOTE — Telephone Encounter (Signed)
Patient needs office visit.  

## 2022-02-26 ENCOUNTER — Ambulatory Visit (HOSPITAL_COMMUNITY): Admission: EM | Admit: 2022-02-26 | Discharge: 2022-02-26 | Discharge status: Against Medical Advice | Payer: 59

## 2022-03-07 ENCOUNTER — Ambulatory Visit (HOSPITAL_COMMUNITY): Payer: Self-pay

## 2022-03-29 ENCOUNTER — Ambulatory Visit: Payer: 59 | Admitting: Orthopaedic Surgery

## 2022-04-07 ENCOUNTER — Other Ambulatory Visit: Payer: Self-pay | Admitting: Gastroenterology

## 2022-04-21 ENCOUNTER — Ambulatory Visit: Payer: Self-pay

## 2022-05-25 ENCOUNTER — Encounter (HOSPITAL_COMMUNITY): Payer: Self-pay

## 2022-05-25 ENCOUNTER — Ambulatory Visit (HOSPITAL_COMMUNITY)
Admission: RE | Admit: 2022-05-25 | Discharge: 2022-05-25 | Disposition: A | Discharge status: Home / Self Care | Payer: Medicaid Other | Source: Ambulatory Visit | Attending: Internal Medicine | Admitting: Internal Medicine

## 2022-05-25 VITALS — BP 120/75 | HR 79 | Temp 98.3°F | Resp 18

## 2022-05-25 DIAGNOSIS — J0120 Acute ethmoidal sinusitis, unspecified: Secondary | ICD-10-CM

## 2022-05-25 DIAGNOSIS — R0981 Nasal congestion: Secondary | ICD-10-CM

## 2022-05-25 MED ORDER — FLUCONAZOLE 150 MG PO TABS: 150.0000 mg | ORAL_TABLET | Freq: Every day | ORAL | 1 refills | Status: DC

## 2022-05-25 MED ORDER — AMOXICILLIN-POT CLAVULANATE 875-125 MG PO TABS: 1.0000 | ORAL_TABLET | Freq: Two times a day (BID) | ORAL | 0 refills | Status: DC

## 2022-05-25 NOTE — Discharge Instructions (Addendum)
Take Augmentin twice daily for the next 7 days to treat your sinus infection.  You may take 1 Diflucan tablet at the start of your antibiotic course.  If you develop any increasing vaginal itching or vaginal discharge in 7 days, you may take the next dose that is at the pharmacy for you to pick up.  Use saline nasal spray as needed  Take Tylenol over-the-counter as needed for headache  Use warm compresses to your sinuses/face to help with sinus pain and break up mucus.  If you develop any new or worsening symptoms or do not improve in the next 2 to 3 days, please return.  If your symptoms are severe, please go to the emergency room.  Follow-up with your primary care provider for further evaluation and management of your symptoms as well as ongoing wellness visits.  I hope you feel better!
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
## 2022-05-25 NOTE — ED Triage Notes (Signed)
Pt c/o sinus pressure and nasal congestion x1-2 wks. Taking OTC with no relief. States given antibiotics last time for same and had relief.

## 2022-07-11 ENCOUNTER — Encounter (HOSPITAL_BASED_OUTPATIENT_CLINIC_OR_DEPARTMENT_OTHER): Payer: Self-pay

## 2022-07-11 ENCOUNTER — Telehealth: Payer: Self-pay

## 2022-07-11 ENCOUNTER — Emergency Department (HOSPITAL_BASED_OUTPATIENT_CLINIC_OR_DEPARTMENT_OTHER): Payer: Self-pay

## 2022-07-11 ENCOUNTER — Other Ambulatory Visit: Payer: Self-pay

## 2022-07-11 ENCOUNTER — Emergency Department (HOSPITAL_BASED_OUTPATIENT_CLINIC_OR_DEPARTMENT_OTHER)
Admission: EM | Admit: 2022-07-11 | Discharge: 2022-07-11 | Discharge status: Against Medical Advice | Payer: Self-pay | Attending: Emergency Medicine | Admitting: Emergency Medicine

## 2022-07-11 ENCOUNTER — Encounter (HOSPITAL_COMMUNITY): Payer: Self-pay

## 2022-07-11 ENCOUNTER — Emergency Department (HOSPITAL_COMMUNITY)
Admission: EM | Admit: 2022-07-11 | Discharge: 2022-07-12 | Discharge status: Against Medical Advice | Payer: Medicaid Other | Attending: Emergency Medicine | Admitting: Emergency Medicine

## 2022-07-11 DIAGNOSIS — R109 Unspecified abdominal pain: Secondary | ICD-10-CM | POA: Insufficient documentation

## 2022-07-11 DIAGNOSIS — Z5329 Procedure and treatment not carried out because of patient's decision for other reasons: Secondary | ICD-10-CM | POA: Insufficient documentation

## 2022-07-11 DIAGNOSIS — R1110 Vomiting, unspecified: Secondary | ICD-10-CM | POA: Insufficient documentation

## 2022-07-11 DIAGNOSIS — Z5321 Procedure and treatment not carried out due to patient leaving prior to being seen by health care provider: Secondary | ICD-10-CM | POA: Insufficient documentation

## 2022-07-11 DIAGNOSIS — R1031 Right lower quadrant pain: Secondary | ICD-10-CM | POA: Insufficient documentation

## 2022-07-11 DIAGNOSIS — Z91040 Latex allergy status: Secondary | ICD-10-CM | POA: Insufficient documentation

## 2022-07-11 LAB — CBC WITH DIFFERENTIAL/PLATELET
Abs Immature Granulocytes: 0.03 10*3/uL (ref 0.00–0.07)
Abs Immature Granulocytes: 0.06 10*3/uL (ref 0.00–0.07)
Basophils Absolute: 0 10*3/uL (ref 0.0–0.1)
Basophils Absolute: 0 10*3/uL (ref 0.0–0.1)
Basophils Relative: 0 %
Basophils Relative: 0 %
Eosinophils Absolute: 0.1 10*3/uL (ref 0.0–0.5)
Eosinophils Absolute: 0 10*3/uL (ref 0.0–0.5)
Eosinophils Relative: 1 %
Eosinophils Relative: 0 %
HCT: 38.9 % (ref 36.0–46.0)
HCT: 44.4 % (ref 36.0–46.0)
Hemoglobin: 12.8 g/dL (ref 12.0–15.0)
Hemoglobin: 14.8 g/dL (ref 12.0–15.0)
Immature Granulocytes: 0 %
Immature Granulocytes: 0 %
Lymphocytes Relative: 25 %
Lymphocytes Relative: 11 %
Lymphs Abs: 2.7 10*3/uL (ref 0.7–4.0)
Lymphs Abs: 1.5 10*3/uL (ref 0.7–4.0)
MCH: 30.2 pg (ref 26.0–34.0)
MCH: 30.7 pg (ref 26.0–34.0)
MCHC: 32.9 g/dL (ref 30.0–36.0)
MCHC: 33.3 g/dL (ref 30.0–36.0)
MCV: 91.7 fL (ref 80.0–100.0)
MCV: 92.1 fL (ref 80.0–100.0)
Monocytes Absolute: 0.6 10*3/uL (ref 0.1–1.0)
Monocytes Absolute: 0.5 10*3/uL (ref 0.1–1.0)
Monocytes Relative: 5 %
Monocytes Relative: 4 %
Neutro Abs: 7.5 10*3/uL (ref 1.7–7.7)
Neutro Abs: 12 10*3/uL — ABNORMAL HIGH (ref 1.7–7.7)
Neutrophils Relative %: 69 %
Neutrophils Relative %: 85 %
Platelets: 224 10*3/uL (ref 150–400)
Platelets: 328 10*3/uL (ref 150–400)
RBC: 4.24 MIL/uL (ref 3.87–5.11)
RBC: 4.82 MIL/uL (ref 3.87–5.11)
RDW: 14.6 % (ref 11.5–15.5)
RDW: 14.6 % (ref 11.5–15.5)
WBC: 11.1 10*3/uL — ABNORMAL HIGH (ref 4.0–10.5)
WBC: 14.1 10*3/uL — ABNORMAL HIGH (ref 4.0–10.5)
nRBC: 0 % (ref 0.0–0.2)
nRBC: 0 % (ref 0.0–0.2)

## 2022-07-11 LAB — COMPREHENSIVE METABOLIC PANEL
ALT: 15 U/L (ref 0–44)
AST: 19 U/L (ref 15–41)
Albumin: 4 g/dL (ref 3.5–5.0)
Alkaline Phosphatase: 66 U/L (ref 38–126)
Anion gap: 7 (ref 5–15)
BUN: 8 mg/dL (ref 6–20)
CO2: 27 mmol/L (ref 22–32)
Calcium: 9.3 mg/dL (ref 8.9–10.3)
Chloride: 105 mmol/L (ref 98–111)
Creatinine, Ser: 0.9 mg/dL (ref 0.44–1.00)
GFR, Estimated: >60 mL/min (ref >60)
Glucose, Bld: 97 mg/dL (ref 70–99)
Potassium: 3.7 mmol/L (ref 3.5–5.1)
Sodium: 139 mmol/L (ref 135–145)
Total Bilirubin: 0.4 mg/dL (ref 0.3–1.2)
Total Protein: 7.1 g/dL (ref 6.5–8.1)

## 2022-07-11 LAB — URINALYSIS, ROUTINE W REFLEX MICROSCOPIC
Bilirubin Urine: NEGATIVE
Glucose, UA: NEGATIVE mg/dL
Ketones, ur: NEGATIVE mg/dL
Leukocytes,Ua: NEGATIVE
Nitrite: NEGATIVE
Protein, ur: NEGATIVE mg/dL
Specific Gravity, Urine: <=1.005 (ref 1.005–1.030)
pH: 5.5 (ref 5.0–8.0)

## 2022-07-11 LAB — I-STAT BETA HCG BLOOD, ED (MC, WL, AP ONLY): I-stat hCG, quantitative: <5 m[IU]/mL (ref <5)

## 2022-07-11 LAB — URINALYSIS, MICROSCOPIC (REFLEX)

## 2022-07-11 LAB — WET PREP, GENITAL
Clue Cells Wet Prep HPF POC: NONE SEEN
Trich, Wet Prep: NONE SEEN
WBC, Wet Prep HPF POC: <10 (ref <10)
Yeast Wet Prep HPF POC: NONE SEEN

## 2022-07-11 LAB — LIPASE, BLOOD: Lipase: 26 U/L (ref 11–51)

## 2022-07-11 LAB — PREGNANCY, URINE: Preg Test, Ur: NEGATIVE

## 2022-07-11 MED ORDER — IOHEXOL 300 MG/ML  SOLN: 100.0000 mL | Freq: Once | INTRAMUSCULAR | Status: DC | PRN

## 2022-07-11 MED ORDER — KETOROLAC TROMETHAMINE 30 MG/ML IJ SOLN
30.0000 mg | Freq: Once | INTRAMUSCULAR | Status: DC
  Filled 2022-07-11: qty 1

## 2022-07-11 NOTE — ED Notes (Signed)
Patient requested IV to be removed before CT scan. When Delrae Alfred, RN returned to room, patient was no longer in room. ED provider notified of patient elopement.

## 2022-07-11 NOTE — ED Triage Notes (Signed)
Patient arrives POV from home c/o pelvic pain, abdominal pain on "lower right side." Pt states the pain started a few days ago and has progressively worsened. Pt states she "hasn't been able to sleep for the past 3 hours." Pt reports dysuria, and burning on urination.

## 2022-07-11 NOTE — ED Triage Notes (Addendum)
Pt states that she has had abdominal pain for 3 days and has been unable to sleep or eat. Pt was recently seen but left before they were able to do the CT scan.

## 2022-07-11 NOTE — ED Provider Notes (Signed)
[facility] EMERGENCY DEPARTMENT  Provider Note  CSN: [id number] Arrival date & time: 07/11/22 0136  History Chief Complaint  Patient presents with   Pelvic Pain    Gina Dorsey is a 31 y.o. female reports 2-3 days of RLQ pain, radiating into back. Unable to sleep tonight due to pain. Some dysuria but no vaginal discharge. She denies any fever but has had some nausea. No prior abdominal surgeries.    Home Medications Prior to Admission medications   Medication Sig Start Date End Date Taking? Authorizing Provider  amoxicillin-clavulanate (AUGMENTIN) 875-125 MG tablet Take 1 tablet by mouth every 12 (twelve) hours. 05/25/22   Talbot Grumbling, FNP  anti-nausea (EMETROL) solution Take 10 mLs by mouth as needed for nausea or vomiting.    [provider]  dicyclomine (BENTYL) 10 MG capsule Take 2 capsules (20 mg total) by mouth 4 (four) times daily as needed for spasms. Pharmacy-please d/c script for #60 monthly 12/10/21   Thornton Park, MD  famotidine (PEPCID) 20 MG tablet Take 1 tablet (20 mg total) by mouth 2 (two) times daily. 12/10/21   Thornton Park, MD  fluconazole (DIFLUCAN) 150 MG tablet Take 1 tablet (150 mg total) by mouth daily. 05/25/22   Talbot Grumbling, FNP  gabapentin (NEURONTIN) 300 MG capsule Take 1 capsule (300 mg total) by mouth 3 (three) times daily. 12/28/21   Magnus Sinning, MD  metaxalone (SKELAXIN) 800 MG tablet Take 800 mg by mouth every 8 (eight) hours as needed. 07/26/21   [provider]  omeprazole (PRILOSEC) 40 MG capsule Take 1 capsule (40 mg total) by mouth daily. 08/03/21   Thornton Park, MD  ondansetron (ZOFRAN-ODT) 4 MG disintegrating tablet DISSOLVE 1 TABLET(4 MG) ON THE TONGUE EVERY 6 HOURS AS NEEDED FOR NAUSEA OR VOMITING 04/07/22   Thornton Park, MD  ondansetron (ZOFRAN-ODT) 8 MG disintegrating tablet Take 1 tablet (8 mg total) by mouth every 8 (eight) hours as needed for nausea or vomiting. 04/06/21    Dorie Rank, MD  Probiotic Product (PROBIOTIC ADVANCED PO) Take 1 capsule by mouth daily.    [provider]     Allergies    Mucinex [guaifenesin er], Reglan [metoclopramide], Sulfamethoxazole-trimethoprim, Latex, Macrobid [nitrofurantoin], Morphine and related, Sulfa antibiotics, Wellbutrin [bupropion], and Celexa [citalopram hydrobromide]   Review of Systems   Review of Systems Please see HPI for pertinent positives and negatives  Physical Exam BP (!) 152/88 (BP Location: Right Arm)   Pulse 89   Temp 98.4 F (36.9 C) (Oral)   Resp 18   SpO2 98%   Physical Exam Vitals and nursing note reviewed.  Constitutional:      Appearance: Normal appearance.  HENT:     Head: Normocephalic and atraumatic.     Nose: Nose normal.     Mouth/Throat:     Mouth: Mucous membranes are moist.  Eyes:     Extraocular Movements: Extraocular movements intact.     Conjunctiva/sclera: Conjunctivae normal.  Cardiovascular:     Rate and Rhythm: Normal rate.  Pulmonary:     Effort: Pulmonary effort is normal.     Breath sounds: Normal breath sounds.  Abdominal:     General: Abdomen is flat.     Palpations: Abdomen is soft.     Tenderness: There is abdominal tenderness in the right lower quadrant. There is no guarding. Negative signs include Murphy's sign and McBurney's sign.  Musculoskeletal:        General: No swelling. Normal range of  motion.     Cervical back: Neck supple.  Skin:    General: Skin is warm and dry.  Neurological:     General: No focal deficit present.     Mental Status: She is alert.  Psychiatric:        Mood and Affect: Mood normal.     ED Results / Procedures / Treatments   EKG None  Procedures Procedures  Medications Ordered in the ED Medications  ketorolac (TORADOL) 30 MG/ML injection 30 mg (has no administration in time range)  iohexol (OMNIPAQUE) 300 MG/ML solution 100 mL (has no administration in time range)    Initial Impression and Plan   Patient with RLQ abdominal pain, also concerned about UTI. She is not having any discharge but reports she may have a STI based on unprotected sex. Will check labs and send for CT to rule out appendicitis.   ED Course   Clinical Course as of 07/11/22 0420  Mon Jul 11, 2022  0317 UA is neg. HCG is neg.  [CS]  6378 CBC with mild leukocytosis.  [CS]  [id number] Wet prep is neg for signs of infection.  [CS]  5885 CMP, lipase are normal.  [CS]  0413 Per RN, patient has left the department without informing staff. She was apparently not comfortable with her IV and demanded it be taken out and then walked out.  [CS]    Clinical Course User Index [CS] Truddie Hidden, MD     MDM Rules/Calculators/A&P Medical Decision Making Given presenting complaint, I considered that admission might be necessary. After review of results from ED lab and/or imaging studies, admission to the hospital is not indicated at this time.    Problems Addressed: RLQ abdominal pain: acute illness or injury  Amount and/or Complexity of Data Reviewed Labs: ordered. Decision-making details documented in ED Course. Radiology: ordered.  Risk Prescription drug management. Decision regarding hospitalization.    Final Clinical Impression(s) / ED Diagnoses Final diagnoses:  RLQ abdominal pain    Rx / DC Orders ED Discharge Orders     None        Truddie Hidden, MD 07/11/22 [phone]

## 2022-07-11 NOTE — Telephone Encounter (Signed)
Thornton Park, MD  Carl Best, RN  Please arrange office follow-up with me or Jaclyn Shaggy - whoever has the first available appointment.   Thanks.   KLB

## 2022-07-12 LAB — COMPREHENSIVE METABOLIC PANEL
ALT: 21 U/L (ref 0–44)
AST: 22 U/L (ref 15–41)
Albumin: 5.3 g/dL — ABNORMAL HIGH (ref 3.5–5.0)
Alkaline Phosphatase: 79 U/L (ref 38–126)
Anion gap: 12 (ref 5–15)
BUN: 12 mg/dL (ref 6–20)
CO2: 24 mmol/L (ref 22–32)
Calcium: 10.9 mg/dL — ABNORMAL HIGH (ref 8.9–10.3)
Chloride: 110 mmol/L (ref 98–111)
Creatinine, Ser: 1.15 mg/dL — ABNORMAL HIGH (ref 0.44–1.00)
GFR, Estimated: >60 mL/min (ref >60)
Glucose, Bld: 127 mg/dL — ABNORMAL HIGH (ref 70–99)
Potassium: 3.7 mmol/L (ref 3.5–5.1)
Sodium: 146 mmol/L — ABNORMAL HIGH (ref 135–145)
Total Bilirubin: 0.5 mg/dL (ref 0.3–1.2)
Total Protein: 8.8 g/dL — ABNORMAL HIGH (ref 6.5–8.1)

## 2022-07-12 LAB — LIPASE, BLOOD: Lipase: 25 U/L (ref 11–51)

## 2022-07-12 LAB — GC/CHLAMYDIA PROBE AMP (~~LOC~~) NOT AT ARMC
Chlamydia: NEGATIVE
Comment: NEGATIVE
Comment: NORMAL
Neisseria Gonorrhea: NEGATIVE

## 2022-07-12 NOTE — ED Notes (Signed)
Patient yelling at visitor in room, seen exiting department but did not speak with staff about leaving.

## 2022-07-14 NOTE — Telephone Encounter (Signed)
Reminder letter sent home.

## 2022-07-23 ENCOUNTER — Ambulatory Visit
Admission: EM | Admit: 2022-07-23 | Discharge: 2022-07-23 | Disposition: A | Discharge status: Home / Self Care | Payer: Medicaid Other | Attending: Physician Assistant | Admitting: Physician Assistant

## 2022-07-23 ENCOUNTER — Encounter (HOSPITAL_COMMUNITY): Payer: Self-pay

## 2022-07-23 ENCOUNTER — Emergency Department (HOSPITAL_COMMUNITY)
Admission: EM | Admit: 2022-07-23 | Discharge: 2022-07-25 | Disposition: A | Discharge status: Home / Self Care | Payer: Medicaid Other | Attending: Emergency Medicine | Admitting: Emergency Medicine

## 2022-07-23 ENCOUNTER — Other Ambulatory Visit: Payer: Self-pay

## 2022-07-23 DIAGNOSIS — F3112 Bipolar disorder, current episode manic without psychotic features, moderate: Secondary | ICD-10-CM

## 2022-07-23 DIAGNOSIS — F319 Bipolar disorder, unspecified: Secondary | ICD-10-CM | POA: Diagnosis present

## 2022-07-23 DIAGNOSIS — R739 Hyperglycemia, unspecified: Secondary | ICD-10-CM | POA: Insufficient documentation

## 2022-07-23 DIAGNOSIS — E876 Hypokalemia: Secondary | ICD-10-CM | POA: Insufficient documentation

## 2022-07-23 DIAGNOSIS — M546 Pain in thoracic spine: Secondary | ICD-10-CM

## 2022-07-23 DIAGNOSIS — Z91040 Latex allergy status: Secondary | ICD-10-CM | POA: Insufficient documentation

## 2022-07-23 DIAGNOSIS — F603 Borderline personality disorder: Secondary | ICD-10-CM | POA: Diagnosis present

## 2022-07-23 DIAGNOSIS — J329 Chronic sinusitis, unspecified: Secondary | ICD-10-CM

## 2022-07-23 DIAGNOSIS — B379 Candidiasis, unspecified: Secondary | ICD-10-CM

## 2022-07-23 DIAGNOSIS — Z20822 Contact with and (suspected) exposure to covid-19: Secondary | ICD-10-CM | POA: Insufficient documentation

## 2022-07-23 DIAGNOSIS — T3695XA Adverse effect of unspecified systemic antibiotic, initial encounter: Secondary | ICD-10-CM

## 2022-07-23 LAB — COMPREHENSIVE METABOLIC PANEL
ALT: 17 U/L (ref 0–44)
AST: 24 U/L (ref 15–41)
Albumin: 4.2 g/dL (ref 3.5–5.0)
Alkaline Phosphatase: 68 U/L (ref 38–126)
Anion gap: 9 (ref 5–15)
BUN: 10 mg/dL (ref 6–20)
CO2: 23 mmol/L (ref 22–32)
Calcium: 9.3 mg/dL (ref 8.9–10.3)
Chloride: 106 mmol/L (ref 98–111)
Creatinine, Ser: 0.92 mg/dL (ref 0.44–1.00)
GFR, Estimated: >60 mL/min (ref >60)
Glucose, Bld: 108 mg/dL — ABNORMAL HIGH (ref 70–99)
Potassium: 3.4 mmol/L — ABNORMAL LOW (ref 3.5–5.1)
Sodium: 138 mmol/L (ref 135–145)
Total Bilirubin: 0.5 mg/dL (ref 0.3–1.2)
Total Protein: 7.4 g/dL (ref 6.5–8.1)

## 2022-07-23 LAB — CBC WITH DIFFERENTIAL/PLATELET
Abs Immature Granulocytes: 0.02 10*3/uL (ref 0.00–0.07)
Basophils Absolute: 0 10*3/uL (ref 0.0–0.1)
Basophils Relative: 0 %
Eosinophils Absolute: 0.2 10*3/uL (ref 0.0–0.5)
Eosinophils Relative: 2 %
HCT: 39.9 % (ref 36.0–46.0)
Hemoglobin: 13 g/dL (ref 12.0–15.0)
Immature Granulocytes: 0 %
Lymphocytes Relative: 31 %
Lymphs Abs: 2.7 10*3/uL (ref 0.7–4.0)
MCH: 30.2 pg (ref 26.0–34.0)
MCHC: 32.6 g/dL (ref 30.0–36.0)
MCV: 92.6 fL (ref 80.0–100.0)
Monocytes Absolute: 0.5 10*3/uL (ref 0.1–1.0)
Monocytes Relative: 6 %
Neutro Abs: 5.4 10*3/uL (ref 1.7–7.7)
Neutrophils Relative %: 61 %
Platelets: 320 10*3/uL (ref 150–400)
RBC: 4.31 MIL/uL (ref 3.87–5.11)
RDW: 13.8 % (ref 11.5–15.5)
WBC: 8.8 10*3/uL (ref 4.0–10.5)
nRBC: 0 % (ref 0.0–0.2)

## 2022-07-23 LAB — ACETAMINOPHEN LEVEL: Acetaminophen (Tylenol), Serum: <10 ug/mL — ABNORMAL LOW (ref 10–30)

## 2022-07-23 LAB — RESP PANEL BY RT-PCR (FLU A&B, COVID) ARPGX2
Influenza A by PCR: NEGATIVE
Influenza B by PCR: NEGATIVE
SARS Coronavirus 2 by RT PCR: NEGATIVE

## 2022-07-23 LAB — RAPID URINE DRUG SCREEN, HOSP PERFORMED
Amphetamines: NOT DETECTED
Barbiturates: NOT DETECTED
Benzodiazepines: POSITIVE — AB
Cocaine: POSITIVE — AB
Opiates: NOT DETECTED
Tetrahydrocannabinol: POSITIVE — AB

## 2022-07-23 LAB — ETHANOL: Alcohol, Ethyl (B): <10 mg/dL (ref <10)

## 2022-07-23 LAB — I-STAT BETA HCG BLOOD, ED (MC, WL, AP ONLY): I-stat hCG, quantitative: <5 m[IU]/mL (ref <5)

## 2022-07-23 LAB — SALICYLATE LEVEL: Salicylate Lvl: <7 mg/dL — ABNORMAL LOW (ref 7.0–30.0)

## 2022-07-23 MED ORDER — FLUCONAZOLE 150 MG PO TABS
150.0000 mg | ORAL_TABLET | Freq: Every day | ORAL | Status: DC
  Administered 2022-07-25: 150 mg via ORAL
  Filled 2022-07-23 (×2): qty 1

## 2022-07-23 MED ORDER — CEFDINIR 300 MG PO CAPS
300.0000 mg | ORAL_CAPSULE | Freq: Two times a day (BID) | ORAL | Status: DC
Start: 2022-07-23 — End: 2022-07-24
  Filled 2022-07-23 (×3): qty 1

## 2022-07-23 MED ORDER — CEFDINIR 300 MG PO CAPS: 300.0000 mg | ORAL_CAPSULE | Freq: Two times a day (BID) | ORAL | 0 refills | Status: DC

## 2022-07-23 MED ORDER — ACETAMINOPHEN 325 MG PO TABS
650.0000 mg | ORAL_TABLET | ORAL | Status: DC | PRN
  Administered 2022-07-23 – 2022-07-25 (×3): 650 mg via ORAL
  Filled 2022-07-23 (×3): qty 2

## 2022-07-23 MED ORDER — AMOXICILLIN-POT CLAVULANATE 875-125 MG PO TABS
1.0000 | ORAL_TABLET | Freq: Once | ORAL | Status: AC
  Administered 2022-07-23: 1 via ORAL
  Filled 2022-07-23: qty 1

## 2022-07-23 MED ORDER — FLUCONAZOLE 150 MG PO TABS: 150.0000 mg | ORAL_TABLET | Freq: Every day | ORAL | 1 refills | Status: DC

## 2022-07-23 MED ORDER — METHOCARBAMOL 500 MG PO TABS
500.0000 mg | ORAL_TABLET | Freq: Three times a day (TID) | ORAL | Status: DC | PRN
  Administered 2022-07-23 – 2022-07-25 (×4): 500 mg via ORAL
  Filled 2022-07-23 (×4): qty 1

## 2022-07-23 MED ORDER — METHOCARBAMOL 500 MG PO TABS: 500.0000 mg | ORAL_TABLET | Freq: Three times a day (TID) | ORAL | 0 refills | Status: DC | PRN

## 2022-07-23 MED ORDER — LORAZEPAM 1 MG PO TABS
1.0000 mg | ORAL_TABLET | Freq: Once | ORAL | Status: AC
  Administered 2022-07-23: 1 mg via ORAL
  Filled 2022-07-23: qty 1

## 2022-07-23 NOTE — Discharge Instructions (Signed)
I am concerned that you have a sinus infection.  Please start Omnicef or cefdinir twice daily for 10 days.  Use over-the-counter medications including Mucinex and Flonase.  I did call in a Diflucan to have on hand in case you develop a yeast infection.  Continue your ibuprofen and Tylenol.  Use methocarbamol up to 3 times a day.  This to make you sleepy so do not drive or drink alcohol with taking it.  Go to the chiropractor next week if your symptoms are not improving.  If you have any worsening symptoms please return for reevaluation.

## 2022-07-23 NOTE — ED Notes (Signed)
Pt has changed into purple scrubs and belonging are placed on the 9-12 side nurse station cabinet
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
## 2022-07-23 NOTE — ED Triage Notes (Signed)
Pt BIB [facility] under IVC. Per paperwork, pt has been diagnosed with bipolar disorder and not taking meds. Pt denies SI/HI at this time.
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
## 2022-07-24 DIAGNOSIS — F3112 Bipolar disorder, current episode manic without psychotic features, moderate: Secondary | ICD-10-CM

## 2022-07-24 DIAGNOSIS — J0191 Acute recurrent sinusitis, unspecified: Secondary | ICD-10-CM

## 2022-07-24 DIAGNOSIS — F319 Bipolar disorder, unspecified: Secondary | ICD-10-CM | POA: Diagnosis present

## 2022-07-24 MED ORDER — GABAPENTIN 600 MG PO TABS
300.0000 mg | ORAL_TABLET | Freq: Two times a day (BID) | ORAL | Status: DC
  Filled 2022-07-24: qty 0.5

## 2022-07-24 MED ORDER — LORAZEPAM 1 MG PO TABS
1.0000 mg | ORAL_TABLET | Freq: Once | ORAL | Status: AC
Start: 2022-07-24 — End: 2022-07-24
  Administered 2022-07-24: 1 mg via ORAL
  Filled 2022-07-24: qty 1

## 2022-07-24 MED ORDER — PANTOPRAZOLE SODIUM 40 MG PO TBEC
40.0000 mg | DELAYED_RELEASE_TABLET | Freq: Every day | ORAL | Status: DC
Start: 2022-07-24 — End: 2022-07-25
  Administered 2022-07-24 – 2022-07-25 (×2): 40 mg via ORAL
  Filled 2022-07-24 (×2): qty 1

## 2022-07-24 MED ORDER — ZIPRASIDONE MESYLATE 20 MG IM SOLR
20.0000 mg | Freq: Once | INTRAMUSCULAR | Status: AC
  Administered 2022-07-24: 20 mg via INTRAMUSCULAR
  Filled 2022-07-24: qty 20

## 2022-07-24 MED ORDER — GABAPENTIN 300 MG PO CAPS
300.0000 mg | ORAL_CAPSULE | Freq: Three times a day (TID) | ORAL | Status: DC
  Administered 2022-07-24 – 2022-07-25 (×2): 300 mg via ORAL
  Filled 2022-07-24: qty 1

## 2022-07-24 MED ORDER — GABAPENTIN 300 MG PO CAPS
300.0000 mg | ORAL_CAPSULE | Freq: Two times a day (BID) | ORAL | Status: DC
Start: 2022-07-24 — End: 2022-07-24
  Administered 2022-07-24: 300 mg via ORAL
  Filled 2022-07-24 (×2): qty 1

## 2022-07-24 MED ORDER — OLANZAPINE 5 MG PO TABS
5.0000 mg | ORAL_TABLET | Freq: Every day | ORAL | Status: DC
  Administered 2022-07-24: 5 mg via ORAL
  Filled 2022-07-24: qty 1

## 2022-07-24 MED ORDER — CLINDAMYCIN HCL 300 MG PO CAPS
300.0000 mg | ORAL_CAPSULE | Freq: Three times a day (TID) | ORAL | Status: DC
  Administered 2022-07-24 – 2022-07-25 (×2): 300 mg via ORAL
  Filled 2022-07-24 (×4): qty 1

## 2022-07-24 MED ORDER — NICOTINE 21 MG/24HR TD PT24
21.0000 mg | MEDICATED_PATCH | Freq: Once | TRANSDERMAL | Status: AC
  Administered 2022-07-24: 21 mg via TRANSDERMAL
  Filled 2022-07-24: qty 1

## 2022-07-24 MED ORDER — STERILE WATER FOR INJECTION IJ SOLN
INTRAMUSCULAR | Status: AC
  Filled 2022-07-24: qty 10

## 2022-07-24 NOTE — ED Notes (Signed)
Pt is taking her shower at this time.
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
## 2022-07-24 NOTE — ED Notes (Signed)
Have notified provider several times that the patient is requesting to talk to the doctor. Patient is escalating.   I have explained that I have notified the provider.

## 2022-07-24 NOTE — ED Notes (Addendum)
In her doorway again yelling, screaming and cursing stating that she wants to go home. She states she's concerned about her cats, she has been assured that her father will be called in the A.M. and will be asked to see about her cats.  She was also advised that being under and involuntary commitment hold she is not allowed to leave and we are legally bound to keep her here until a determination has been made as to her status as far as discharge or inpatient placement.  She acknowledges she understands that, but does wish to abide.  She then slammed her door and began throwing her cup of water at  the door with continues yelling and cursing at staff. Ignores requests when asked to lower her voice as other patients are sleeping.

## 2022-07-24 NOTE — Progress Notes (Signed)
Per Leandro Reasoner, NP, patient meets criteria for inpatient treatment. There are no available beds at [facility] today. CSW faxed referrals to the following facilities for review:  [facility]  Pending - No Request Sent N/A [address], [place] [address] [phone] --  [place]  Pending - No Request Sent N/A [address], [place] [address] [phone] --  [facility]  Pending - No Request [address] [facility] [address] [place] [address] [phone] --  [address] [place] [address] [phone] --  [place]  Pending - No Request Sent N/A [address] [place] [address] [phone] --  [facility]  Pending - No Request Sent N/A [address] [place], [place] [address] [phone] --  [facility]  Pending - No Request Sent N/A [address] [place] [address] [place] --  [facility]  Pending - No Request Sent N/A [address] [place] [address] [facility]  Pending - No Request Sent N/A [address] [place] [address] [phone] --  [facility]  Pending - No Request Sent N/A [address] [place] [address] [phone] --  [place]  Pending - No Request Sent N/A [address] [place] [address] [phone] --  [facility]  Pending - No Request Sent N/A [address] [place] [address] [phone] --  [place]  Pending - No Request Sent N/A [place] [place] [address] [phone] --  [facility]  Pending - No Request Sent N/A [address] [place] [address] [phone] --  [facility]  Pending - No Request Sent N/A [address] [place] [address] [phone] --   TTS will continue to seek bed placement.  Glennie Isle, MSW, [facility] [place] Phone: [phone] Disposition/TOC

## 2022-07-24 NOTE — ED Notes (Signed)
Pt got her breakfast tray.

## 2022-07-24 NOTE — ED Notes (Signed)
At the door of her room yelling, cursing, screaming and wanting to go home.  She been made of aware and updated as to the status of her care.

## 2022-07-24 NOTE — ED Provider Notes (Addendum)
Emergency Medicine Observation Re-evaluation Note  Gina Dorsey is a 31 y.o. female, seen on rounds today.  Pt initially presented to the ED for complaints of IVC Currently, the patient is calm.  Physical Exam  BP 128/88 (BP Location: Left Arm)   Pulse 75   Temp 97.9 F (36.6 C) (Oral)   Resp 18   Ht '5\' 4"'$  (1.626 m)   Wt 56.7 kg   SpO2 100%   BMI 21.46 kg/m  Physical Exam General: calm, nad Psych: cooperative  ED Course / MDM  EKG:   I have reviewed the labs performed to date as well as medications administered while in observation.  Recent changes in the last 24 hours include agitated overnight, shouting at staff.  Plan  Current plan is for placement.  Gina Dorsey is not under involuntary commitment.     Jeanell Sparrow, DO 07/24/22 [place], DO 07/24/22 1447

## 2022-07-24 NOTE — Progress Notes (Signed)
CSW was contacted by Alyse Low admission, the patient was declined due to the patient having to be restrained within the last 24 hours.   Glennie Isle, MSW, [facility] [place] Phone: [phone] Disposition/TOC

## 2022-07-25 MED ORDER — FAMOTIDINE 20 MG PO TABS
20.0000 mg | ORAL_TABLET | Freq: Every day | ORAL | Status: DC
  Administered 2022-07-25: 20 mg via ORAL
  Filled 2022-07-25: qty 1

## 2022-07-25 MED ORDER — CLINDAMYCIN HCL 300 MG PO CAPS
300.0000 mg | ORAL_CAPSULE | Freq: Three times a day (TID) | ORAL | 0 refills | Status: AC
Start: 2022-07-25 — End: 2022-08-04

## 2022-07-25 MED ORDER — GABAPENTIN 300 MG PO CAPS
300.0000 mg | ORAL_CAPSULE | Freq: Three times a day (TID) | ORAL | 0 refills | Status: DC
Start: 2022-07-25 — End: 2022-12-20

## 2022-07-25 MED ORDER — OLANZAPINE 5 MG PO TABS
5.0000 mg | ORAL_TABLET | Freq: Every day | ORAL | 0 refills | Status: DC
Start: 2022-07-25 — End: 2023-04-16

## 2022-07-25 NOTE — ED Notes (Signed)
Pt awake this am. Pt provided clean sheets so she could make her bed. Pt taking a shower. No complaints/ behaviors att. Will continue to monitor.

## 2022-07-25 NOTE — Discharge Instructions (Signed)
Discharge recommendations:  Patient is to take medications as prescribed. Please see information for follow-up appointment with psychiatry and therapy. Please follow up with your primary care provider for all medical related needs.   Therapy: We recommend that patient participate in individual therapy to address mental health concerns.  Medications: The patient is to contact a medical professional and/or outpatient provider to address any new side effects that develop. Patient should update outpatient providers of any new medications and/or medication changes.   Atypical antipsychotics: If you are prescribed an atypical antipsychotic, it is recommended that your height, weight, BMI, blood pressure, fasting lipid panel, and fasting blood sugar be monitored by your outpatient providers.  Safety:  The patient should abstain from use of illicit substances/drugs and abuse of any medications. If symptoms worsen or do not continue to improve or if the patient becomes actively suicidal or homicidal then it is recommended that the patient return to the closest hospital emergency department, the [facility], or call 911 for further evaluation and treatment. National Suicide Prevention Lifeline 1-800-SUICIDE or 332-747-9884.  About 988 988 offers 24/7 access to trained crisis counselors who can help people experiencing mental health-related distress. People can call or text 988 or chat 988lifeline.org for themselves or if they are worried about a loved one who may need crisis support.  Crisis Mobile: [facility]:                     [phone] (for crisis response 24 hours a day) [facility]:                                            [phone]                                             [facility] [address] [place]adene Pierini [place], [place]   Outpatient Psychiatry and Counseling   [facility]: Mobile  Crisis Management 24 hours:  [phone]   [facility] of the [employer] sliding scale fee and walk in schedule: M-F 8am-12pm/1pm-3pm [place], [place] [address] [address] [place], [place] [address] [phone]   [facility] (Former[facility] as [facility] [employer])- new patient walk-in appointments available Monday - Friday 8am -3pm.          [address] [place], [place] [address] [phone] or crisis line- [facility]/ Intensive Outpatient Therapy Program [place], [place] [address] [phone]   [place]                                                             [phone]  [place], [place] [address]                                                 [place]   [facility] [phone]. [place], [place] [address]     [employer] of Care                                                                                                             [address] Johnette [place]  [place], [place] [address]                                                           [phone]   [place], [place], [place] [address] [phone]   [facility]                                   [address]                            [place],  [address]                                               [place], [place] [address]  [place] [address] [place] [address]   [facility]                                               [address] [place], [place], [place], [place] [address] [place]                                               [address]                                                [place], [place] [address]                                               [phone]                                                     [facility] [address] [place], [place] [address] [phone] Resources for Temporary Residential Assistance/Crisis [place] [facility]) M-F 8am-3pm   [address], [place] [address]   [phone] Services include: laundry, barbering, support groups, case management, phone  & computer access, showers, AA/NA mtgs, mental health/substance abuse nurse, job skills class, disability information, VA assistance, spiritual classes, etc.

## 2022-07-25 NOTE — ED Notes (Signed)
Pt tearful. Sts she "can't go to sleep". Reports "feeling anxious", "having a lot of thoughts". Pt shares she miss her family, wants to go home to her cats and wants to be home for her bday. This Probation officer showed understanding. Pt able to manage her emotions and is re-attempting to go back to sleep. Will continue to monitor. RN aware

## 2022-07-25 NOTE — ED Notes (Signed)
Pt making a phone call att

## 2022-07-25 NOTE — ED Notes (Signed)
Pt just woke up. Breakfast tray given. Pt sitting up in bed, eating.
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
## 2022-07-25 NOTE — ED Provider Notes (Signed)
Emergency Medicine Observation Re-evaluation Note  Gina Dorsey is a 31 y.o. female, seen on rounds today.  Pt initially presented to the ED for complaints of IVC Currently, the patient is awake and alert ambulating around the room in no acute distress.  Patient is requesting an antacid as needed.[name]  Physical Exam  BP 132/88 (BP Location: Left Arm)   Pulse 84   Temp 98.6 F (37 C) (Oral)   Resp 19   Ht '5\' 4"'$  (1.626 m)   Wt 56.7 kg   SpO2 99%   BMI 21.46 kg/m  Physical Exam General: Awake and alert in no acute distress Cardiac: Regular rate and rhythm Lungs: No increased work of breathing Psych: Calm, cooperative  ED Course / MDM  EKG:   I have reviewed the labs performed to date as well as medications administered while in observation.  Recent changes in the last 24 hours include no significant change, pending inpatient placement.  Plan  Current plan is for inpatient placement, pending acceptance to facility.  Gina Dorsey is not under involuntary commitment.     Ottie Glazier, DO 07/25/22 [phone]

## 2022-07-27 ENCOUNTER — Telehealth: Payer: Self-pay | Admitting: Physician Assistant

## 2022-07-27 NOTE — Telephone Encounter (Signed)
Received a message that patient was having intermittent pruritus associated with cefdinir prescribed at 07/23/2022 appointment and was requesting transition to amoxicillin.  Cefdinir was chosen as she had recently had Augmentin.  Upon review of chart appears patient was recently prescribed clindamycin and is actively taking this medication.  Attempted to call patient to discuss her concerns as well as change medication, however, she did not answer.  Left a phone message for return call.  If she is on clindamycin can discontinue cefdinir but does not need additional medication.  If she is concerned for an allergic reaction she needs to be evaluated immediately.

## 2022-08-09 NOTE — Progress Notes (Deleted)
08/09/2022 LORREN ROSSETTI [id number] Mar 17, 1991   Chief Complaint:  History of Present Illness:  Jung L. Taylor is a 31 year old female with a past medical history of anxiety, depression, bipolar disorder, OCD and PTSD.      Latest Ref Rng & Units 07/23/2022    8:50 PM 07/11/2022   10:59 PM 07/11/2022    3:05 AM  CBC  WBC 4.0 - 10.5 K/uL 8.8  14.1  11.1   Hemoglobin 12.0 - 15.0 g/dL 13.0  14.8  12.8   Hematocrit 36.0 - 46.0 % 39.9  44.4  38.9   Platelets 150 - 400 K/uL 320  328  224        Latest Ref Rng & Units 07/23/2022    8:50 PM 07/11/2022   10:59 PM 07/11/2022    3:05 AM  CMP  Glucose 70 - 99 mg/dL 108  127  97   BUN 6 - 20 mg/dL '10  12  8   '$ Creatinine 0.44 - 1.00 mg/dL 0.92  1.15  0.90   Sodium 135 - 145 mmol/L 138  146  139   Potassium 3.5 - 5.1 mmol/L 3.4  3.7  3.7   Chloride 98 - 111 mmol/L 106  110  105   CO2 22 - 32 mmol/L '23  24  27   '$ Calcium 8.9 - 10.3 mg/dL 9.3  10.9  9.3   Total Protein 6.5 - 8.1 g/dL 7.4  8.8  7.1   Total Bilirubin 0.3 - 1.2 mg/dL 0.5  0.5  0.4   Alkaline Phos 38 - 126 U/L 68  79  66   AST 15 - 41 U/L '24  22  19   '$ ALT 0 - 44 U/L '17  21  15     '$ CTAP 04/06/2021: No acute intra-abdominal oe intrapelvic abnormality.  GI PROCEDURES:  EGD 03/02/2021: - LA Grade A reflux esophagitis with no bleeding. Biopsied. - Normal stomach. Biopsied. - Normal examined duodenum. Biopsied.  Colonoscopy 03/02/2021: Anal fissure found on perianal exam. - The entire examined colon is normal. Biopsied. - Three 1 to 3 mm polyps in the rectum and in the cecum, removed with a cold snare. - Three 1 to 3 mm polyps in the rectum and in the cecum, removed with a cold snare. Resected and retrieved. - The examination was otherwise normal on direct and retroflexion views. - 10 year colonoscopy recall   1. Surgical [P], duodenal biopsies - DUODENAL MUCOSA WITH NO SIGNIFICANT PATHOLOGIC FINDINGS. - NEGATIVE FOR INCREASED INTRAEPITHELIAL LYMPHOCYTES AND VILLOUS  ARCHITECTURAL CHANGES. 2. Surgical [P], random gastric biopsies - MILD CHRONIC GASTRITIS. - WARTHIN-STARRY STAIN IS NEGATIVE FOR HELICOBACTER PYLORI. 3. Surgical [P], distal esophagus - SQUAMOCOLUMNAR ESOPHAGEAL MUCOSA WITH REACTIVE/REGENERATIVE CHANGES. - NEGATIVE FOR INTESTINAL METAPLASIA (GOBLET CELL METAPLASIA). - NEGATIVE FOR INCREASED INTRAEPITHELIAL EOSINOPHILS. 4. Surgical [P], mid esophagus and proximal esophagus - SQUAMOUS ESOPHAGEAL EPITHELIUM WITH NO SIGNIFICANT PATHOLOGIC FINDINGS. - NEGATIVE FOR INCREASED INTRAEPITHELIAL EOSINOPHILS. 5. Surgical [P], colon, cecum and rectum, polyp (3) - COLONIC MUCOSA WITH UNDERLYING LYMPHOID AGGREGATE (X3). - NEGATIVE FOR DYSPLASIA. 6. Surgical [P], colon nos, random colon biopsies COLONIC MUCOSA WITH NO SIGNIFICANT PATHOLOGIC FINDINGS. - NEGATIVE FOR ACTIVE INFLAMMATION AND OTHER ABNORMALITIES.  Current Medications, Allergies, Past Medical History, Past Surgical History, Family History and Social History were reviewed in [employer] record.   Review of Systems:   Constitutional: Negative for fever, sweats, chills or weight loss.  Respiratory: Negative for shortness of breath.  Cardiovascular: Negative for chest pain, palpitations and leg swelling.  Gastrointestinal: See HPI.  Musculoskeletal: Negative for back pain or muscle aches.  Neurological: Negative for dizziness, headaches or paresthesias.    Physical Exam: There were no vitals taken for this visit. General: Well developed, w   ***female in no acute distress. Head: Normocephalic and atraumatic. Eyes: No scleral icterus. Conjunctiva pink . Ears: Normal auditory acuity. Mouth: Dentition intact. No ulcers or lesions.  Lungs: Clear throughout to auscultation. Heart: Regular rate and rhythm, no murmur. Abdomen: Soft, nontender and nondistended. No masses or hepatomegaly. Normal bowel sounds x 4 quadrants.  Rectal: *** Musculoskeletal: Symmetrical with  no gross deformities. Extremities: No edema. Neurological: Alert oriented x 4. No focal deficits.  Psychological: Alert and cooperative. Normal mood and affect  Assessment and Recommendations: ***

## 2022-08-10 ENCOUNTER — Ambulatory Visit: Payer: Self-pay | Admitting: Nurse Practitioner

## 2022-11-16 ENCOUNTER — Other Ambulatory Visit: Payer: Self-pay

## 2022-11-16 ENCOUNTER — Encounter (HOSPITAL_BASED_OUTPATIENT_CLINIC_OR_DEPARTMENT_OTHER): Payer: Self-pay

## 2022-11-16 ENCOUNTER — Emergency Department (HOSPITAL_BASED_OUTPATIENT_CLINIC_OR_DEPARTMENT_OTHER)
Admission: EM | Admit: 2022-11-16 | Discharge: 2022-11-16 | Discharge status: Against Medical Advice | Payer: Medicaid Other | Attending: Emergency Medicine | Admitting: Emergency Medicine

## 2022-11-16 DIAGNOSIS — M7918 Myalgia, other site: Secondary | ICD-10-CM | POA: Diagnosis present

## 2022-11-16 DIAGNOSIS — R112 Nausea with vomiting, unspecified: Secondary | ICD-10-CM | POA: Diagnosis not present

## 2022-11-16 DIAGNOSIS — Z5321 Procedure and treatment not carried out due to patient leaving prior to being seen by health care provider: Secondary | ICD-10-CM | POA: Diagnosis not present

## 2022-11-16 DIAGNOSIS — Z20822 Contact with and (suspected) exposure to covid-19: Secondary | ICD-10-CM | POA: Insufficient documentation

## 2022-11-16 DIAGNOSIS — R6883 Chills (without fever): Secondary | ICD-10-CM | POA: Diagnosis not present

## 2022-11-16 LAB — COMPREHENSIVE METABOLIC PANEL
ALT: 13 U/L (ref 0–44)
AST: 19 U/L (ref 15–41)
Albumin: 4.3 g/dL (ref 3.5–5.0)
Alkaline Phosphatase: 63 U/L (ref 38–126)
Anion gap: 10 (ref 5–15)
BUN: 6 mg/dL (ref 6–20)
CO2: 27 mmol/L (ref 22–32)
Calcium: 9.5 mg/dL (ref 8.9–10.3)
Chloride: 103 mmol/L (ref 98–111)
Creatinine, Ser: 0.84 mg/dL (ref 0.44–1.00)
GFR, Estimated: >60 mL/min (ref >60)
Glucose, Bld: 116 mg/dL — ABNORMAL HIGH (ref 70–99)
Potassium: 3.7 mmol/L (ref 3.5–5.1)
Sodium: 140 mmol/L (ref 135–145)
Total Bilirubin: 0.4 mg/dL (ref 0.3–1.2)
Total Protein: 6.8 g/dL (ref 6.5–8.1)

## 2022-11-16 LAB — CBC
HCT: 38.5 % (ref 36.0–46.0)
Hemoglobin: 12.5 g/dL (ref 12.0–15.0)
MCH: 30.2 pg (ref 26.0–34.0)
MCHC: 32.5 g/dL (ref 30.0–36.0)
MCV: 93 fL (ref 80.0–100.0)
Platelets: 288 10*3/uL (ref 150–400)
RBC: 4.14 MIL/uL (ref 3.87–5.11)
RDW: 14.1 % (ref 11.5–15.5)
WBC: 9.3 10*3/uL (ref 4.0–10.5)
nRBC: 0 % (ref 0.0–0.2)

## 2022-11-16 LAB — URINALYSIS, ROUTINE W REFLEX MICROSCOPIC
Bilirubin Urine: NEGATIVE
Glucose, UA: NEGATIVE mg/dL
Hgb urine dipstick: NEGATIVE
Ketones, ur: NEGATIVE mg/dL
Leukocytes,Ua: NEGATIVE
Nitrite: NEGATIVE
Protein, ur: NEGATIVE mg/dL
Specific Gravity, Urine: 1.012 (ref 1.005–1.030)
pH: 7.5 (ref 5.0–8.0)

## 2022-11-16 LAB — PREGNANCY, URINE: Preg Test, Ur: NEGATIVE

## 2022-11-16 LAB — RESP PANEL BY RT-PCR (RSV, FLU A&B, COVID)  RVPGX2
Influenza A by PCR: NEGATIVE
Influenza B by PCR: NEGATIVE
Resp Syncytial Virus by PCR: NEGATIVE
SARS Coronavirus 2 by RT PCR: NEGATIVE

## 2022-11-16 LAB — LIPASE, BLOOD: Lipase: <10 U/L — ABNORMAL LOW (ref 11–51)

## 2022-11-16 NOTE — ED Triage Notes (Signed)
Patient here POV from Home.  Endorses Body Aches, Chills that began approximately 1 Month ago. Some Associated N/V.   No Fever or Cough.   NAD Noted during Triage. A&Ox4. GCS 15. Ambulatory.

## 2022-11-16 NOTE — ED Notes (Signed)
Pt called to be roomed, no answer

## 2022-11-16 NOTE — ED Notes (Signed)
Called Once for Triage.

## 2022-11-16 NOTE — ED Notes (Signed)
Pt called to room, no answer

## 2022-11-17 ENCOUNTER — Emergency Department (HOSPITAL_COMMUNITY)
Admission: EM | Admit: 2022-11-17 | Discharge: 2022-11-17 | Discharge status: Against Medical Advice | Payer: Medicaid Other | Attending: Student | Admitting: Student

## 2022-11-17 DIAGNOSIS — R112 Nausea with vomiting, unspecified: Secondary | ICD-10-CM | POA: Insufficient documentation

## 2022-11-17 DIAGNOSIS — R5383 Other fatigue: Secondary | ICD-10-CM | POA: Insufficient documentation

## 2022-11-17 DIAGNOSIS — R109 Unspecified abdominal pain: Secondary | ICD-10-CM | POA: Diagnosis not present

## 2022-11-17 DIAGNOSIS — Z5321 Procedure and treatment not carried out due to patient leaving prior to being seen by health care provider: Secondary | ICD-10-CM | POA: Diagnosis not present

## 2022-11-17 MED ORDER — DICYCLOMINE HCL 10 MG PO CAPS
10.0000 mg | ORAL_CAPSULE | Freq: Once | ORAL | Status: DC
Start: 2022-11-17 — End: 2022-11-17
  Filled 2022-11-17: qty 1

## 2022-11-17 NOTE — ED Notes (Signed)
Called pt x5 and no response.

## 2022-11-17 NOTE — ED Triage Notes (Signed)
Patient here with complaint of fatigue, abdominal pain, emesis, and poor appetite for the last few months. Patient is alert, oriented, ambulating independently with steady gait.

## 2022-11-17 NOTE — ED Provider Triage Note (Signed)
Emergency Medicine Provider Triage Evaluation Note  Gina Dorsey , a 31 y.o. female  was evaluated in triage.  Pt complains of abdominal pain.  Patient states that she has been having abdominal pain since August of this year.  She states she is unable to keep almost nothing down orally.  She complains mostly of nausea and vomiting.  She states that she has been out of her medications which include Bentyl and other chronic care medications due to not being approved for Medicaid.  She does state that she was seen by gastroenterology approximate 1 year ago and had both colonoscopy and EGD and was prescribed Bentyl which seemed to provide a great deal of relief of her symptoms.  Review of Systems  Positive: As above Negative: As above  Physical Exam  BP (!) 145/101 (BP Location: Right Arm)   Pulse 83   Temp 98 F (36.7 C)   Resp 18   SpO2 100%  Gen:   Awake, no distress   Resp:  Normal effort  MSK:   Moves extremities without difficulty  Other:    Medical Decision Making  Medically screening exam initiated at 12:54 PM.  Appropriate orders placed.  Gina Dorsey was informed that the remainder of the evaluation will be completed by another provider, this initial triage assessment does not replace that evaluation, and the importance of remaining in the ED until their evaluation is complete.     Gina Peng, PA-C 11/17/22 1257

## 2022-11-22 ENCOUNTER — Telehealth: Payer: Self-pay

## 2022-11-22 NOTE — Telephone Encounter (Signed)
Thornton Park, MD  Carl Best, RN Please arrange follow-up with me or the first available APP. Thanks.  KLB

## 2022-11-22 NOTE — Telephone Encounter (Signed)
OV scheduled with patient for 12/02/22 at 1:30 pm with Janett Billow, [place].

## 2022-12-02 ENCOUNTER — Ambulatory Visit: Payer: Medicaid Other | Admitting: Gastroenterology

## 2022-12-14 ENCOUNTER — Telehealth: Payer: Self-pay | Admitting: Physical Medicine and Rehabilitation

## 2022-12-14 NOTE — Telephone Encounter (Signed)
Patient wants an appt to see dr. Ernestina Patches and to get pain meds filled. She advised she wants to know price or visit she has no insurance. Please advise..[phone]

## 2022-12-15 NOTE — Telephone Encounter (Signed)
scheduled

## 2022-12-19 ENCOUNTER — Telehealth: Payer: Self-pay | Admitting: Physical Medicine and Rehabilitation

## 2022-12-19 ENCOUNTER — Emergency Department (HOSPITAL_COMMUNITY)
Admission: EM | Admit: 2022-12-19 | Discharge: 2022-12-19 | Discharge status: Against Medical Advice | Payer: Medicaid Other | Attending: Emergency Medicine | Admitting: Emergency Medicine

## 2022-12-19 ENCOUNTER — Ambulatory Visit: Payer: Medicaid Other | Admitting: Physical Medicine and Rehabilitation

## 2022-12-19 ENCOUNTER — Other Ambulatory Visit: Payer: Self-pay

## 2022-12-19 ENCOUNTER — Emergency Department (HOSPITAL_COMMUNITY): Payer: Medicaid Other

## 2022-12-19 DIAGNOSIS — M546 Pain in thoracic spine: Secondary | ICD-10-CM | POA: Diagnosis not present

## 2022-12-19 DIAGNOSIS — Z5321 Procedure and treatment not carried out due to patient leaving prior to being seen by health care provider: Secondary | ICD-10-CM | POA: Insufficient documentation

## 2022-12-19 DIAGNOSIS — M542 Cervicalgia: Secondary | ICD-10-CM | POA: Insufficient documentation

## 2022-12-19 DIAGNOSIS — Y92410 Unspecified street and highway as the place of occurrence of the external cause: Secondary | ICD-10-CM | POA: Diagnosis not present

## 2022-12-19 NOTE — Telephone Encounter (Signed)
Spoke with patient and rescheduled for 12/20/22
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
## 2022-12-19 NOTE — ED Notes (Signed)
Pt called once by nurse and twice by x-ray with no repsonse

## 2022-12-20 ENCOUNTER — Ambulatory Visit (INDEPENDENT_AMBULATORY_CARE_PROVIDER_SITE_OTHER): Payer: Medicaid Other | Admitting: Physical Medicine and Rehabilitation

## 2022-12-20 ENCOUNTER — Encounter: Payer: Self-pay | Admitting: Physical Medicine and Rehabilitation

## 2022-12-20 DIAGNOSIS — M5442 Lumbago with sciatica, left side: Secondary | ICD-10-CM

## 2022-12-20 DIAGNOSIS — M542 Cervicalgia: Secondary | ICD-10-CM | POA: Diagnosis not present

## 2022-12-20 DIAGNOSIS — M7918 Myalgia, other site: Secondary | ICD-10-CM

## 2022-12-20 DIAGNOSIS — M546 Pain in thoracic spine: Secondary | ICD-10-CM

## 2022-12-20 DIAGNOSIS — M5441 Lumbago with sciatica, right side: Secondary | ICD-10-CM

## 2022-12-20 DIAGNOSIS — G894 Chronic pain syndrome: Secondary | ICD-10-CM

## 2022-12-20 DIAGNOSIS — G8929 Other chronic pain: Secondary | ICD-10-CM

## 2022-12-20 MED ORDER — GABAPENTIN 300 MG PO CAPS: 300.0000 mg | ORAL_CAPSULE | Freq: Three times a day (TID) | ORAL | 0 refills | Status: DC
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
## 2022-12-20 NOTE — Progress Notes (Signed)
Functional Pain Scale - descriptive words and definitions  Intense (8)    Cannot complete any ADLs without much assistance/cannot concentrate/conversation is difficult/unable to sleep and unable to use distraction. Severe range order  Average Pain  varies  Neck pain. Wants refill on medication

## 2022-12-21 ENCOUNTER — Ambulatory Visit: Payer: Medicaid Other | Admitting: Nurse Practitioner
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
## 2023-01-10 ENCOUNTER — Ambulatory Visit: Payer: Medicaid Other | Admitting: Nurse Practitioner
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
## 2023-02-07 ENCOUNTER — Ambulatory Visit: Payer: Self-pay

## 2023-02-09 ENCOUNTER — Emergency Department (HOSPITAL_COMMUNITY)
Admission: EM | Admit: 2023-02-09 | Discharge: 2023-02-09 | Discharge status: Against Medical Advice | Payer: Medicaid Other | Attending: Emergency Medicine | Admitting: Emergency Medicine

## 2023-02-09 ENCOUNTER — Emergency Department (HOSPITAL_COMMUNITY): Payer: Medicaid Other

## 2023-02-09 ENCOUNTER — Other Ambulatory Visit: Payer: Self-pay

## 2023-02-09 DIAGNOSIS — Z5321 Procedure and treatment not carried out due to patient leaving prior to being seen by health care provider: Secondary | ICD-10-CM | POA: Diagnosis not present

## 2023-02-09 DIAGNOSIS — R1110 Vomiting, unspecified: Secondary | ICD-10-CM | POA: Insufficient documentation

## 2023-02-09 LAB — COMPREHENSIVE METABOLIC PANEL
ALT: 13 U/L (ref 0–44)
AST: 20 U/L (ref 15–41)
Albumin: 3.9 g/dL (ref 3.5–5.0)
Alkaline Phosphatase: 76 U/L (ref 38–126)
Anion gap: 10 (ref 5–15)
BUN: 10 mg/dL (ref 6–20)
CO2: 28 mmol/L (ref 22–32)
Calcium: 9.3 mg/dL (ref 8.9–10.3)
Chloride: 100 mmol/L (ref 98–111)
Creatinine, Ser: 0.82 mg/dL (ref 0.44–1.00)
GFR, Estimated: >60 mL/min (ref >60)
Glucose, Bld: 125 mg/dL — ABNORMAL HIGH (ref 70–99)
Potassium: 4.1 mmol/L (ref 3.5–5.1)
Sodium: 138 mmol/L (ref 135–145)
Total Bilirubin: 0.3 mg/dL (ref 0.3–1.2)
Total Protein: 7.3 g/dL (ref 6.5–8.1)

## 2023-02-09 LAB — CBC WITH DIFFERENTIAL/PLATELET
Abs Immature Granulocytes: 0.05 10*3/uL (ref 0.00–0.07)
Basophils Absolute: 0 10*3/uL (ref 0.0–0.1)
Basophils Relative: 0 %
Eosinophils Absolute: 0.2 10*3/uL (ref 0.0–0.5)
Eosinophils Relative: 2 %
HCT: 40.8 % (ref 36.0–46.0)
Hemoglobin: 13.2 g/dL (ref 12.0–15.0)
Immature Granulocytes: 0 %
Lymphocytes Relative: 13 %
Lymphs Abs: 1.9 10*3/uL (ref 0.7–4.0)
MCH: 29.7 pg (ref 26.0–34.0)
MCHC: 32.4 g/dL (ref 30.0–36.0)
MCV: 91.7 fL (ref 80.0–100.0)
Monocytes Absolute: 0.7 10*3/uL (ref 0.1–1.0)
Monocytes Relative: 5 %
Neutro Abs: 11.8 10*3/uL — ABNORMAL HIGH (ref 1.7–7.7)
Neutrophils Relative %: 80 %
Platelets: 359 10*3/uL (ref 150–400)
RBC: 4.45 MIL/uL (ref 3.87–5.11)
RDW: 14.1 % (ref 11.5–15.5)
WBC: 14.7 10*3/uL — ABNORMAL HIGH (ref 4.0–10.5)
nRBC: 0 % (ref 0.0–0.2)

## 2023-02-09 LAB — LIPASE, BLOOD: Lipase: 30 U/L (ref 11–51)

## 2023-02-09 MED ORDER — ONDANSETRON HCL 4 MG/2ML IJ SOLN
4.0000 mg | Freq: Once | INTRAMUSCULAR | Status: AC
  Administered 2023-02-09: 4 mg via INTRAVENOUS
  Filled 2023-02-09: qty 2

## 2023-02-09 NOTE — ED Triage Notes (Addendum)
Pt. Stated, I woke up a hour ago and was cold and felt like I was going to pass out, had N/V and some chest pain and arm pain Currently taking antibiotics for tonsillitis.

## 2023-02-09 NOTE — ED Notes (Signed)
Called pt for vs check x3 and registration called pt x2 no response. Pt moved OTF

## 2023-02-09 NOTE — ED Provider Triage Note (Signed)
Emergency Medicine Provider Triage Evaluation Note  Gina Dorsey , a 33 y.o. female  was evaluated in triage.  Pt complains of vomiting.  Had an episode this morning and doesn't feel well.  Review of Systems  Positive: N/v/cp/ap Negative: none  Physical Exam  BP (!) 139/92 (BP Location: Right Arm)   Pulse (!) 101   Temp 98.6 F (37 C)   Resp 18   Ht '5\' 4"'$  (1.626 m)   Wt 54.4 kg   LMP 01/17/2023   SpO2 98%   BMI 20.60 kg/m  Gen:   Awake, no distress   Resp:  Normal effort  MSK:   Moves extremities without difficulty  Other:  Benign abdominal exam  Medical Decision Making  Medically screening exam initiated at 8:19 AM.  Appropriate orders placed.  [name] was informed that the remainder of the evaluation will be completed by another provider, this initial triage assessment does not replace that evaluation, and the importance of remaining in the ED until their evaluation is complete.  32 year old female just seen for sore throats and sounds like viral syndrome.  She was negative for flu COVID and RSV and so the presumption by urgent care was that she must have a bacterial infection and was started on antibiotics.  She woke up this morning and had an episode of vomiting and does not feel well she felt like she had some left lateral chest pain and came to the emergency department via EMS.   Deno Etienne, DO 02/09/23 [phone]

## 2023-02-15 ENCOUNTER — Other Ambulatory Visit: Payer: Self-pay | Admitting: Physical Medicine and Rehabilitation

## 2023-03-03 ENCOUNTER — Emergency Department (HOSPITAL_BASED_OUTPATIENT_CLINIC_OR_DEPARTMENT_OTHER)
Admission: EM | Admit: 2023-03-03 | Discharge: 2023-03-03 | Disposition: A | Discharge status: Home / Self Care | Payer: Medicaid Other | Attending: Emergency Medicine | Admitting: Emergency Medicine

## 2023-03-03 ENCOUNTER — Encounter (HOSPITAL_BASED_OUTPATIENT_CLINIC_OR_DEPARTMENT_OTHER): Payer: Self-pay | Admitting: Emergency Medicine

## 2023-03-03 ENCOUNTER — Emergency Department (HOSPITAL_BASED_OUTPATIENT_CLINIC_OR_DEPARTMENT_OTHER): Payer: Medicaid Other

## 2023-03-03 ENCOUNTER — Other Ambulatory Visit: Payer: Self-pay

## 2023-03-03 DIAGNOSIS — Z91040 Latex allergy status: Secondary | ICD-10-CM | POA: Insufficient documentation

## 2023-03-03 DIAGNOSIS — K529 Noninfective gastroenteritis and colitis, unspecified: Secondary | ICD-10-CM | POA: Diagnosis not present

## 2023-03-03 DIAGNOSIS — R1013 Epigastric pain: Secondary | ICD-10-CM

## 2023-03-03 DIAGNOSIS — D72829 Elevated white blood cell count, unspecified: Secondary | ICD-10-CM | POA: Insufficient documentation

## 2023-03-03 LAB — URINALYSIS, ROUTINE W REFLEX MICROSCOPIC
Bilirubin Urine: NEGATIVE
Glucose, UA: NEGATIVE mg/dL
Hgb urine dipstick: NEGATIVE
Ketones, ur: NEGATIVE mg/dL
Leukocytes,Ua: NEGATIVE
Nitrite: NEGATIVE
Protein, ur: NEGATIVE mg/dL
Specific Gravity, Urine: 1.035 — ABNORMAL HIGH (ref 1.005–1.030)
pH: 7 (ref 5.0–8.0)

## 2023-03-03 LAB — COMPREHENSIVE METABOLIC PANEL
ALT: 11 U/L (ref 0–44)
AST: 17 U/L (ref 15–41)
Albumin: 4.4 g/dL (ref 3.5–5.0)
Alkaline Phosphatase: 68 U/L (ref 38–126)
Anion gap: 10 (ref 5–15)
BUN: 16 mg/dL (ref 6–20)
CO2: 27 mmol/L (ref 22–32)
Calcium: 9.9 mg/dL (ref 8.9–10.3)
Chloride: 100 mmol/L (ref 98–111)
Creatinine, Ser: 0.85 mg/dL (ref 0.44–1.00)
GFR, Estimated: >60 mL/min (ref >60)
Glucose, Bld: 175 mg/dL — ABNORMAL HIGH (ref 70–99)
Potassium: 3.7 mmol/L (ref 3.5–5.1)
Sodium: 137 mmol/L (ref 135–145)
Total Bilirubin: 0.3 mg/dL (ref 0.3–1.2)
Total Protein: 7.2 g/dL (ref 6.5–8.1)

## 2023-03-03 LAB — CBC
HCT: 37.1 % (ref 36.0–46.0)
Hemoglobin: 12.3 g/dL (ref 12.0–15.0)
MCH: 29.6 pg (ref 26.0–34.0)
MCHC: 33.2 g/dL (ref 30.0–36.0)
MCV: 89.2 fL (ref 80.0–100.0)
Platelets: 338 10*3/uL (ref 150–400)
RBC: 4.16 MIL/uL (ref 3.87–5.11)
RDW: 14.4 % (ref 11.5–15.5)
WBC: 10.7 10*3/uL — ABNORMAL HIGH (ref 4.0–10.5)
nRBC: 0 % (ref 0.0–0.2)

## 2023-03-03 LAB — HCG, QUANTITATIVE, PREGNANCY: hCG, Beta Chain, Quant, S: <1 m[IU]/mL (ref <5)

## 2023-03-03 LAB — TSH: TSH: 0.586 u[IU]/mL (ref 0.350–4.500)

## 2023-03-03 LAB — LIPASE, BLOOD: Lipase: 46 U/L (ref 11–51)

## 2023-03-03 MED ORDER — DICYCLOMINE HCL 10 MG PO CAPS
20.0000 mg | ORAL_CAPSULE | Freq: Once | ORAL | Status: AC
  Administered 2023-03-03: 20 mg via ORAL
  Filled 2023-03-03: qty 2

## 2023-03-03 MED ORDER — IOPAMIDOL (ISOVUE-300) INJECTION 61%
100.0000 mL | Freq: Once | INTRAVENOUS | Status: AC | PRN
  Administered 2023-03-03: 100 mL via INTRAVENOUS

## 2023-03-03 MED ORDER — DICYCLOMINE HCL 20 MG PO TABS: 20.0000 mg | ORAL_TABLET | Freq: Two times a day (BID) | ORAL | 0 refills | Status: DC

## 2023-03-03 MED ORDER — ONDANSETRON 4 MG PO TBDP: 4.0000 mg | ORAL_TABLET | Freq: Three times a day (TID) | ORAL | 0 refills | Status: DC | PRN

## 2023-03-03 MED ORDER — ONDANSETRON HCL 4 MG/2ML IJ SOLN
4.0000 mg | Freq: Once | INTRAMUSCULAR | Status: AC
  Administered 2023-03-03: 4 mg via INTRAVENOUS
  Filled 2023-03-03: qty 2

## 2023-03-03 MED ORDER — LACTATED RINGERS IV BOLUS
1000.0000 mL | Freq: Once | INTRAVENOUS | Status: AC
  Administered 2023-03-03: 1000 mL via INTRAVENOUS

## 2023-03-03 NOTE — Discharge Instructions (Signed)
You have been seen in the Emergency Department (ED)  today for nausea and vomiting.  Your work up today has not shown a clear cause for your symptoms, but they may be due to a viral infection or food poisoning. You have been prescribed Zofran; please use as prescribed as needed for your nausea.  You can also take the Bentyl as needed for abdominal cramping.  Follow up with your doctor as soon as possible, ideally within one week, regarding today's emergent visit and your symptoms of nausea/vomiting.   Return to the Emergency Department (ED)  if you develop severe abdominal pain, bloody vomiting, bloody diarrhea, if you are unable to tolerate fluids due to vomiting, or if you develop other symptoms that concern you.

## 2023-03-03 NOTE — ED Triage Notes (Signed)
Upper abd pain and vomiting since this morning.around 5AM Was seen at UC, medicated and sent to eval for enlarge spleen eval

## 2023-03-03 NOTE — ED Provider Notes (Signed)
[place] [facility] Provider Note   CSN: [id number] Arrival date & time: 03/03/23  1844     History  Chief Complaint  Patient presents with   Abdominal Pain    Gina Dorsey is a 32 y.o. female.  With PMH of IBS, personality disorder, esophagitis who presents with abdominal pain in the upper abdomen associate with nausea and vomiting.  Patient says she woke up today with severe epigastrium throbbing pain with associated nausea and vomiting.  She had multiple episodes in the end she had a little bit of blood mixed with her vomit.  She has had no further vomiting.  She has had loose nonbloody stools.  She has had no fevers but endorses chills with vomiting.  She has had no cough, chest pain, shortness of breath.  No urinary symptoms.  She just finished her menstrual period.  She denies any alcohol use or drug use.  She has history of IBS and lives with abdominal pain and typically Bentyl and Zofran helps with her symptoms.  Of note she recently had a tonsillitis with negative Monospot today.  She went to an urgent care who thought they felt splenomegaly and sent her to the ER for evaluation.   Abdominal Pain      Home Medications Prior to Admission medications   Medication Sig Start Date End Date Taking? Authorizing Provider  dicyclomine (BENTYL) 20 MG tablet Take 1 tablet (20 mg total) by mouth 2 (two) times daily. 03/03/23  Yes Mardene SayerBranham, Niclas Markell C, MD  ondansetron (ZOFRAN-ODT) 4 MG disintegrating tablet Take 1 tablet (4 mg total) by mouth every 8 (eight) hours as needed. 03/03/23  Yes Mardene SayerBranham, Tyja Gortney C, MD  anti-nausea (EMETROL) solution Take 10 mLs by mouth as needed for nausea or vomiting. Patient not taking: Reported on 07/23/2022    [provider]  dicyclomine (BENTYL) 10 MG capsule Take 2 capsules (20 mg total) by mouth 4 (four) times daily as needed for spasms. Pharmacy-please d/c script for #60 monthly Patient not taking: Reported on  07/23/2022 12/10/21   Tressia DanasBeavers, Kimberly, MD  famotidine (PEPCID) 20 MG tablet Take 1 tablet (20 mg total) by mouth 2 (two) times daily. Patient not taking: Reported on 07/23/2022 12/10/21   Tressia DanasBeavers, Kimberly, MD  fluconazole (DIFLUCAN) 150 MG tablet Take 1 tablet (150 mg total) by mouth daily. 07/23/22   Raspet, Denny PeonErin K, PA-C  gabapentin (NEURONTIN) 300 MG capsule TAKE 1 CAPSULE(300 MG) BY MOUTH THREE TIMES DAILY 02/15/23   Juanda ChanceWilliams, Megan E, NP  methocarbamol (ROBAXIN) 500 MG tablet Take 1 tablet (500 mg total) by mouth every 8 (eight) hours as needed for muscle spasms. Patient not taking: Reported on 07/23/2022 07/23/22   Raspet, Denny PeonErin K, PA-C  OLANZapine (ZYPREXA) 5 MG tablet Take 1 tablet (5 mg total) by mouth at bedtime. 07/25/22   Jackelyn PolingBerry, Jason A, NP  omeprazole (PRILOSEC) 40 MG capsule Take 1 capsule (40 mg total) by mouth daily. Patient not taking: Reported on 07/23/2022 08/03/21   Tressia DanasBeavers, Kimberly, MD  ondansetron (ZOFRAN-ODT) 8 MG disintegrating tablet Take 1 tablet (8 mg total) by mouth every 8 (eight) hours as needed for nausea or vomiting. Patient not taking: Reported on 07/23/2022 04/06/21   Linwood DibblesKnapp, Jon, MD  Probiotic Product (PROBIOTIC ADVANCED PO) Take 1 capsule by mouth daily. Patient not taking: Reported on 07/23/2022    [provider]      Allergies    Mucinex [guaifenesin er], Reglan [metoclopramide], Sulfamethoxazole-trimethoprim, Sulfa antibiotics, Latex, Morphine  and related, Nitrofurantoin, Wellbutrin [bupropion], and Celexa [citalopram hydrobromide]    Review of Systems   Review of Systems  Gastrointestinal:  Positive for abdominal pain.    Physical Exam Updated Vital Signs BP (!) 133/90   Pulse 88   Temp 98.1 F (36.7 C) (Oral)   Resp 18   LMP 02/15/2023 (Approximate)   SpO2 96%  Physical Exam Constitutional: Alert and oriented. Well appearing and in no distress. Eyes: Conjunctivae are normal. ENT      Head: Normocephalic and atraumatic. Cardiovascular: S1, S2,  regular rate and rhythm , warm well-perfused Respiratory: Normal respiratory effort. Breath sounds are normal.   O2 sat 96 on her Gastrointestinal: Soft and nondistended with epigastrium tenderness no rebound or guarding, not peritonitic Musculoskeletal: Normal range of motion in all extremities. Neurologic: Normal speech and language. No gross focal neurologic deficits are appreciated. Skin: Skin is warm, dry and intact. No rash noted. Psychiatric: Mood and affect are normal. Speech and behavior are normal.  ED Results / Procedures / Treatments   Labs (all labs ordered are listed, but only abnormal results are displayed) Labs Reviewed  COMPREHENSIVE METABOLIC PANEL - Abnormal; Notable for the following components:      Result Value   Glucose, Bld 175 (*)    All other components within normal limits  CBC - Abnormal; Notable for the following components:   WBC 10.7 (*)    All other components within normal limits  URINALYSIS, ROUTINE W REFLEX MICROSCOPIC - Abnormal; Notable for the following components:   Color, Urine COLORLESS (*)    Specific Gravity, Urine 1.035 (*)    All other components within normal limits  LIPASE, BLOOD  HCG, QUANTITATIVE, PREGNANCY    EKG None  Radiology CT ABDOMEN PELVIS W CONTRAST  Result Date: 03/03/2023 CLINICAL DATA:  Mild splenomegaly, hematemesis, upper abdominal pain. EXAM: CT ABDOMEN AND PELVIS WITH CONTRAST TECHNIQUE: Multidetector CT imaging of the abdomen and pelvis was performed using the standard protocol following bolus administration of intravenous contrast. RADIATION DOSE REDUCTION: This exam was performed according to the departmental dose-optimization program which includes automated exposure control, adjustment of the mA and/or kV according to patient size and/or use of iterative reconstruction technique. CONTRAST:  100mL ISOVUE-300 IOPAMIDOL (ISOVUE-300) INJECTION 61% COMPARISON:  CT abdomen and pelvis 04/06/2021 FINDINGS: Lower chest: No  acute abnormality. Hepatobiliary: No focal liver abnormality is seen. No gallstones, gallbladder wall thickening, or biliary dilatation. Pancreas: Unremarkable. Spleen: The spleen is normal in size measuring 10.8 cm in craniocaudal dimension. This is unchanged from 04/06/2021. Adrenals/Urinary Tract: Normal adrenal glands. No urinary calculi or hydronephrosis. Unremarkable bladder. Stomach/Bowel: Normal caliber large and small bowel. No bowel wall thickening. The appendix is not definitively visualized. No secondary signs of appendicitis. Stomach is within normal limits. Vascular/Lymphatic: No significant vascular findings are present. No enlarged abdominal or pelvic lymph nodes. Reproductive: Uterus and bilateral adnexa are unremarkable. Other: Trace free fluid in the pelvis is likely physiologic. No free intraperitoneal air. Musculoskeletal: No acute osseous abnormality. IMPRESSION: 1. No acute abnormality in the abdomen or pelvis. 2. Normal size spleen, unchanged from 04/06/2021. Electronically Signed   By: Minerva Festeryler  Stutzman M.D.   On: 03/03/2023 20:41    Procedures Procedures  Remain on constant cardiac monitoring sinus rhythm with normal rates.  Medications Ordered in ED Medications  lactated ringers bolus 1,000 mL (1,000 mLs Intravenous New Bag/Given 03/03/23 1940)  ondansetron (ZOFRAN) injection 4 mg (4 mg Intravenous Given 03/03/23 1936)  dicyclomine (BENTYL) capsule 20 mg (20  mg Oral Given 03/03/23 1936)  iopamidol (ISOVUE-300) 61 % injection 100 mL (100 mLs Intravenous Contrast Given 03/03/23 2030)    ED Course/ Medical Decision Making/ A&P Clinical Course as of 03/03/23 2102  Fri Mar 03, 2023  2059 Labs reviewed by me notable for a mild leukocytosis 10.7 likely reactive from vomiting.  Glucose 175 no anion gap normal bicarbonate.  Creatinine 0.85.  Lipase within normal limits no concern for acute pancreatitis.  No transaminitis.  UA with elevated SG likely due to mild dehydration no signs of UTI.   She is not pregnant.  CTAP with contrast obtained which I personally reviewed no evidence of splenomegaly on my read.  No acute findings per radiology.  Patient feeling much better tolerating p.o. without any nausea or vomiting.  She is drinking any fluids.  Discharging with prescriptions for Bentyl and Zofran and advised follow-up with PCP and GI doctor.  She is in agreement with plan and discharged in good condition. [VB]    Clinical Course User Index [VB] Mardene Sayer, MD                             Medical Decision Making LINCOLN SHEW is a 32 y.o. female.  With PMH of IBS, personality disorder, esophagitis who presents with abdominal pain in the upper abdomen associate with nausea and vomiting.   Based on the patient's epigastric pain, differential includes but is not limited to gastroenteritis, cholelithiasis, cholecystitis, hepatitis, GERD, PUD, pancreatitis, splenic infarct. Less likely nephrolithiasis or pyelonephritis with no CVAT and no urinary symptoms. Less likely pulmonary source such as pneumonia with no cardiopulmonary complaints, no hypoxia, and no increased work of breathing.  PERC negative.  Labs reviewed by me notable for a mild leukocytosis 10.7 likely reactive from vomiting.  Glucose 175 no anion gap normal bicarbonate.  Creatinine 0.85.  Lipase within normal limits no concern for acute pancreatitis.  No transaminitis.  UA with elevated SG likely due to mild dehydration no signs of UTI.  She is not pregnant.  CTAP with contrast obtained which I personally reviewed no evidence of splenomegaly on my read.  No acute findings per radiology.  Patient feeling much better tolerating p.o. without any nausea or vomiting.  She is drinking many fluids.  Discharging with prescriptions for Bentyl and Zofran and advised follow-up with PCP and GI doctor.  She is in agreement with plan and discharged in good condition.   Amount and/or Complexity of Data Reviewed Labs:  ordered. Radiology: ordered.  Risk Prescription drug management.      Final Clinical Impression(s) / ED Diagnoses Final diagnoses:  Epigastric pain  Gastroenteritis    Rx / DC Orders ED Discharge Orders          Ordered    ondansetron (ZOFRAN-ODT) 4 MG disintegrating tablet  Every 8 hours PRN        03/03/23 2101    dicyclomine (BENTYL) 20 MG tablet  2 times daily        03/03/23 2101              Mardene Sayer, MD 03/03/23 2102

## 2023-03-03 NOTE — ED Notes (Signed)
Provider at bedside for evaluation.

## 2023-03-03 NOTE — ED Notes (Signed)
Patient transported to CT 

## 2023-03-03 NOTE — ED Notes (Signed)
Reviewed AVS with patient, patient expressed understanding of directions, denies further questions at this time. 

## 2023-03-14 ENCOUNTER — Emergency Department (HOSPITAL_COMMUNITY)
Admission: EM | Admit: 2023-03-14 | Discharge: 2023-03-14 | Discharge status: Against Medical Advice | Payer: Medicaid Other | Attending: Emergency Medicine | Admitting: Emergency Medicine

## 2023-03-14 ENCOUNTER — Other Ambulatory Visit: Payer: Self-pay | Admitting: Gastroenterology

## 2023-03-14 ENCOUNTER — Encounter (HOSPITAL_COMMUNITY): Payer: Self-pay

## 2023-03-14 DIAGNOSIS — Z91040 Latex allergy status: Secondary | ICD-10-CM | POA: Diagnosis not present

## 2023-03-14 DIAGNOSIS — R109 Unspecified abdominal pain: Secondary | ICD-10-CM | POA: Insufficient documentation

## 2023-03-14 LAB — URINALYSIS, ROUTINE W REFLEX MICROSCOPIC
Bilirubin Urine: NEGATIVE
Glucose, UA: NEGATIVE mg/dL
Hgb urine dipstick: NEGATIVE
Ketones, ur: NEGATIVE mg/dL
Leukocytes,Ua: NEGATIVE
Nitrite: NEGATIVE
Protein, ur: NEGATIVE mg/dL
Specific Gravity, Urine: 1.004 — ABNORMAL LOW (ref 1.005–1.030)
pH: 7 (ref 5.0–8.0)

## 2023-03-14 LAB — CBC WITH DIFFERENTIAL/PLATELET
Abs Immature Granulocytes: 0.03 10*3/uL (ref 0.00–0.07)
Basophils Absolute: 0 10*3/uL (ref 0.0–0.1)
Basophils Relative: 0 %
Eosinophils Absolute: 0.2 10*3/uL (ref 0.0–0.5)
Eosinophils Relative: 2 %
HCT: 41.7 % (ref 36.0–46.0)
Hemoglobin: 13.6 g/dL (ref 12.0–15.0)
Immature Granulocytes: 0 %
Lymphocytes Relative: 17 %
Lymphs Abs: 2.1 10*3/uL (ref 0.7–4.0)
MCH: 29.9 pg (ref 26.0–34.0)
MCHC: 32.6 g/dL (ref 30.0–36.0)
MCV: 91.6 fL (ref 80.0–100.0)
Monocytes Absolute: 0.6 10*3/uL (ref 0.1–1.0)
Monocytes Relative: 5 %
Neutro Abs: 9 10*3/uL — ABNORMAL HIGH (ref 1.7–7.7)
Neutrophils Relative %: 76 %
Platelets: 339 10*3/uL (ref 150–400)
RBC: 4.55 MIL/uL (ref 3.87–5.11)
RDW: 14.3 % (ref 11.5–15.5)
WBC: 11.9 10*3/uL — ABNORMAL HIGH (ref 4.0–10.5)
nRBC: 0 % (ref 0.0–0.2)

## 2023-03-14 LAB — COMPREHENSIVE METABOLIC PANEL
ALT: 11 U/L (ref 0–44)
AST: 23 U/L (ref 15–41)
Albumin: 4.1 g/dL (ref 3.5–5.0)
Alkaline Phosphatase: 72 U/L (ref 38–126)
Anion gap: 11 (ref 5–15)
BUN: 10 mg/dL (ref 6–20)
CO2: 25 mmol/L (ref 22–32)
Calcium: 9.2 mg/dL (ref 8.9–10.3)
Chloride: 101 mmol/L (ref 98–111)
Creatinine, Ser: 0.97 mg/dL (ref 0.44–1.00)
GFR, Estimated: >60 mL/min (ref >60)
Glucose, Bld: 123 mg/dL — ABNORMAL HIGH (ref 70–99)
Potassium: 4.8 mmol/L (ref 3.5–5.1)
Sodium: 137 mmol/L (ref 135–145)
Total Bilirubin: 0.6 mg/dL (ref 0.3–1.2)
Total Protein: 7.4 g/dL (ref 6.5–8.1)

## 2023-03-14 MED ORDER — SODIUM CHLORIDE 0.9 % IV BOLUS
1000.0000 mL | Freq: Once | INTRAVENOUS | Status: AC
  Administered 2023-03-14: 1000 mL via INTRAVENOUS

## 2023-03-14 MED ORDER — DICYCLOMINE HCL 20 MG PO TABS
20.0000 mg | ORAL_TABLET | Freq: Once | ORAL | Status: AC
  Administered 2023-03-14: 20 mg via ORAL
  Filled 2023-03-14: qty 1

## 2023-03-14 MED ORDER — HYDROGEN PEROXIDE 3 % EX SOLN
CUTANEOUS | Status: AC
  Filled 2023-03-14: qty 473

## 2023-03-14 MED ORDER — ONDANSETRON HCL 4 MG/2ML IJ SOLN
4.0000 mg | Freq: Once | INTRAMUSCULAR | Status: AC
  Administered 2023-03-14: 4 mg via INTRAVENOUS
  Filled 2023-03-14: qty 2

## 2023-03-14 NOTE — ED Provider Notes (Signed)
[place] EMERGENCY DEPARTMENT AT [facility] Provider Note   CSN: [id number] Arrival date & time: 03/14/23  7829     History  Chief Complaint  Patient presents with   Abdominal Pain    Gina Dorsey is a 32 y.o. female.  Pt reports she has abdominal cramping.  Pt reports she has irritable bowel.  Pt complains of nausea.  Pt denies fever  no uti symptoms.  No std concerns. No pregnancy  The history is provided by the patient. No language interpreter was used.  Abdominal Pain Pain quality: aching   Pain radiates to:  Does not radiate Pain severity:  Mild Onset quality:  Gradual Timing:  Constant Chronicity:  New Relieved by:  Nothing Worsened by:  Nothing Ineffective treatments:  None tried      Home Medications Prior to Admission medications   Medication Sig Start Date End Date Taking? Authorizing Provider  anti-nausea (EMETROL) solution Take 10 mLs by mouth as needed for nausea or vomiting. Patient not taking: Reported on 07/23/2022    [provider]  dicyclomine (BENTYL) 10 MG capsule Take 2 capsules (20 mg total) by mouth 4 (four) times daily as needed for spasms. Pharmacy-please d/c script for #60 monthly Patient not taking: Reported on 07/23/2022 12/10/21   Tressia Danas, MD  dicyclomine (BENTYL) 20 MG tablet Take 1 tablet (20 mg total) by mouth 2 (two) times daily. 03/03/23   Mardene Sayer, MD  famotidine (PEPCID) 20 MG tablet Take 1 tablet (20 mg total) by mouth 2 (two) times daily. Patient not taking: Reported on 07/23/2022 12/10/21   Tressia Danas, MD  fluconazole (DIFLUCAN) 150 MG tablet Take 1 tablet (150 mg total) by mouth daily. 07/23/22   Raspet, Denny Peon K, PA-C  gabapentin (NEURONTIN) 300 MG capsule TAKE 1 CAPSULE(300 MG) BY MOUTH THREE TIMES DAILY 02/15/23   Juanda Chance, NP  methocarbamol (ROBAXIN) 500 MG tablet Take 1 tablet (500 mg total) by mouth every 8 (eight) hours as needed for muscle spasms. Patient not taking:  Reported on 07/23/2022 07/23/22   Raspet, Denny Peon K, PA-C  OLANZapine (ZYPREXA) 5 MG tablet Take 1 tablet (5 mg total) by mouth at bedtime. 07/25/22   Jackelyn Poling, NP  omeprazole (PRILOSEC) 40 MG capsule Take 1 capsule (40 mg total) by mouth daily. Patient not taking: Reported on 07/23/2022 08/03/21   Tressia Danas, MD  ondansetron (ZOFRAN-ODT) 4 MG disintegrating tablet Take 1 tablet (4 mg total) by mouth every 8 (eight) hours as needed. 03/03/23   Mardene Sayer, MD  ondansetron (ZOFRAN-ODT) 8 MG disintegrating tablet Take 1 tablet (8 mg total) by mouth every 8 (eight) hours as needed for nausea or vomiting. Patient not taking: Reported on 07/23/2022 04/06/21   Linwood Dibbles, MD  Probiotic Product (PROBIOTIC ADVANCED PO) Take 1 capsule by mouth daily. Patient not taking: Reported on 07/23/2022    [provider]      Allergies    Mucinex [guaifenesin er], Reglan [metoclopramide], Sulfamethoxazole-trimethoprim, Sulfa antibiotics, Latex, Morphine and related, Nitrofurantoin, Wellbutrin [bupropion], and Celexa [citalopram hydrobromide]    Review of Systems   Review of Systems  Gastrointestinal:  Positive for abdominal pain.  All other systems reviewed and are negative.   Physical Exam Updated Vital Signs BP 139/84   Pulse 89   Temp 98.6 F (37 C) (Oral)   Resp 18   LMP  (LMP Unknown)   SpO2 100%  Physical Exam Vitals and nursing note reviewed.  Constitutional:  Appearance: She is well-developed.  HENT:     Head: Normocephalic.  Cardiovascular:     Rate and Rhythm: Normal rate and regular rhythm.  Pulmonary:     Effort: Pulmonary effort is normal.  Abdominal:     General: Bowel sounds are normal. There is no distension.     Palpations: Abdomen is soft.  Musculoskeletal:        General: Normal range of motion.     Cervical back: Normal range of motion.  Skin:    General: Skin is warm.  Neurological:     General: No focal deficit present.     Mental Status: She is  alert and oriented to person, place, and time.  Psychiatric:        Mood and Affect: Mood normal.     ED Results / Procedures / Treatments   Labs (all labs ordered are listed, but only abnormal results are displayed) Labs Reviewed  CBC WITH DIFFERENTIAL/PLATELET - Abnormal; Notable for the following components:      Result Value   WBC 11.9 (*)    Neutro Abs 9.0 (*)    All other components within normal limits  COMPREHENSIVE METABOLIC PANEL - Abnormal; Notable for the following components:   Glucose, Bld 123 (*)    All other components within normal limits  URINALYSIS, ROUTINE W REFLEX MICROSCOPIC - Abnormal; Notable for the following components:   Color, Urine STRAW (*)    Specific Gravity, Urine 1.004 (*)    All other components within normal limits    EKG None  Radiology No results found.  Procedures Procedures    Medications Ordered in ED Medications  sodium chloride 0.9 % bolus 1,000 mL (1,000 mLs Intravenous New Bag/Given 03/14/23 0730)  ondansetron (ZOFRAN) injection 4 mg (4 mg Intravenous Given 03/14/23 0730)  dicyclomine (BENTYL) tablet 20 mg (20 mg Oral Given 03/14/23 0729)  hydrogen peroxide 3 % external solution (  Rx Charged 03/14/23 0841)    ED Course/ Medical Decision Making/ A&P                             Medical Decision Making Pt complains of vomitting and abdominal cramping   Amount and/or Complexity of Data Reviewed Labs: ordered. Decision-making details documented in ED Course.    Details: Labs ordered reviewed and interpreted.  UA is negative   Risk Risk Details: Pt left before discharge.  Pt had received iv fluids, bentyl and zofran. RN is calling to make sure Iv was removed           Final Clinical Impression(s) / ED Diagnoses Final diagnoses:  Abdominal pain, unspecified abdominal location    Rx / DC Orders ED Discharge Orders     None         Osie Cheeks 03/14/23 1010    Melene Plan, DO 03/14/23  1047

## 2023-03-14 NOTE — ED Triage Notes (Signed)
Pt states that she has been having generalized abd pain with nausea that has been going on for a few hours

## 2023-03-14 NOTE — ED Notes (Signed)
This RN went to round on pt and was unable to locate pt. PA notified.

## 2023-03-14 NOTE — ED Notes (Signed)
Attempted to call pt, no answer. 

## 2023-03-15 ENCOUNTER — Other Ambulatory Visit: Payer: Self-pay

## 2023-03-15 ENCOUNTER — Encounter (HOSPITAL_COMMUNITY): Payer: Self-pay

## 2023-03-15 ENCOUNTER — Emergency Department (HOSPITAL_COMMUNITY)
Admission: EM | Admit: 2023-03-15 | Discharge: 2023-03-15 | Discharge status: Against Medical Advice | Payer: Medicaid Other | Attending: Emergency Medicine | Admitting: Emergency Medicine

## 2023-03-15 DIAGNOSIS — Z91040 Latex allergy status: Secondary | ICD-10-CM | POA: Insufficient documentation

## 2023-03-15 DIAGNOSIS — R1084 Generalized abdominal pain: Secondary | ICD-10-CM | POA: Insufficient documentation

## 2023-03-15 DIAGNOSIS — R112 Nausea with vomiting, unspecified: Secondary | ICD-10-CM | POA: Diagnosis present

## 2023-03-15 DIAGNOSIS — Z5329 Procedure and treatment not carried out because of patient's decision for other reasons: Secondary | ICD-10-CM | POA: Insufficient documentation

## 2023-03-15 MED ORDER — SODIUM CHLORIDE 0.9 % IV BOLUS: 1000.0000 mL | Freq: Once | INTRAVENOUS | Status: DC

## 2023-03-15 MED ORDER — ONDANSETRON HCL 4 MG/2ML IJ SOLN: 4.0000 mg | Freq: Once | INTRAMUSCULAR | Status: DC

## 2023-03-15 NOTE — ED Triage Notes (Addendum)
Patient is here for evaluation of nausea and vomiting. States she was seen here yesterday for the same. Reports that she was not given any prescriptions to get filled for home and has still be unable to get any relief. Was seen here yesterday for the same, left AMA.

## 2023-03-15 NOTE — ED Notes (Signed)
Seen pt leave ED

## 2023-03-15 NOTE — ED Notes (Signed)
Pt left without being seen, she was tired of waiting

## 2023-03-15 NOTE — ED Provider Notes (Signed)
[place] EMERGENCY DEPARTMENT AT [facility] Provider Note   CSN: [id number] Arrival date & time: 03/15/23  0865     History  Chief Complaint  Patient presents with   Nausea   Emesis    Gina Dorsey is a 32 y.o. female history of cannabinoid use, borderline personality disorder, bipolar for evaluation of nausea and vomiting.  Seen yesterday for same however left AMA.  States she felt well when she left the hospital however when she got home she started vomiting again.  She has not taken anything for symptoms.  Emesis is NBNB.  She had some generalized abdominal cramping prior to her emesis.  States she typically gets this way right before her menstrual cycle.  She denies chance of pregnancy.  No back pain, fever, dysuria, hematuria, diarrhea.  HPI     Home Medications Prior to Admission medications   Medication Sig Start Date End Date Taking? Authorizing Provider  anti-nausea (EMETROL) solution Take 10 mLs by mouth as needed for nausea or vomiting. Patient not taking: Reported on 07/23/2022    [provider]  dicyclomine (BENTYL) 10 MG capsule Take 2 capsules (20 mg total) by mouth 4 (four) times daily as needed for spasms. Pharmacy-please d/c script for #60 monthly Patient not taking: Reported on 07/23/2022 12/10/21   Tressia Danas, MD  dicyclomine (BENTYL) 20 MG tablet Take 1 tablet (20 mg total) by mouth 2 (two) times daily. 03/03/23   Mardene Sayer, MD  famotidine (PEPCID) 20 MG tablet Take 1 tablet (20 mg total) by mouth 2 (two) times daily. Patient not taking: Reported on 07/23/2022 12/10/21   Tressia Danas, MD  fluconazole (DIFLUCAN) 150 MG tablet Take 1 tablet (150 mg total) by mouth daily. 07/23/22   Raspet, Denny Peon K, PA-C  gabapentin (NEURONTIN) 300 MG capsule TAKE 1 CAPSULE(300 MG) BY MOUTH THREE TIMES DAILY 02/15/23   Juanda Chance, NP  methocarbamol (ROBAXIN) 500 MG tablet Take 1 tablet (500 mg total) by mouth every 8 (eight) hours as  needed for muscle spasms. Patient not taking: Reported on 07/23/2022 07/23/22   Raspet, Denny Peon K, PA-C  OLANZapine (ZYPREXA) 5 MG tablet Take 1 tablet (5 mg total) by mouth at bedtime. 07/25/22   Jackelyn Poling, NP  omeprazole (PRILOSEC) 40 MG capsule Take 1 capsule (40 mg total) by mouth daily. Patient not taking: Reported on 07/23/2022 08/03/21   Tressia Danas, MD  ondansetron (ZOFRAN-ODT) 4 MG disintegrating tablet Take 1 tablet (4 mg total) by mouth every 8 (eight) hours as needed. 03/03/23   Mardene Sayer, MD  ondansetron (ZOFRAN-ODT) 8 MG disintegrating tablet Take 1 tablet (8 mg total) by mouth every 8 (eight) hours as needed for nausea or vomiting. Patient not taking: Reported on 07/23/2022 04/06/21   Linwood Dibbles, MD  Probiotic Product (PROBIOTIC ADVANCED PO) Take 1 capsule by mouth daily. Patient not taking: Reported on 07/23/2022    [provider]      Allergies    Mucinex [guaifenesin er], Reglan [metoclopramide], Sulfamethoxazole-trimethoprim, Sulfa antibiotics, Latex, Morphine and related, Nitrofurantoin, Wellbutrin [bupropion], and Celexa [citalopram hydrobromide]    Review of Systems   Review of Systems  Constitutional: Negative.   HENT: Negative.    Respiratory: Negative.    Cardiovascular: Negative.   Gastrointestinal:  Positive for anal bleeding, nausea and vomiting. Negative for abdominal distention, abdominal pain, blood in stool, constipation, diarrhea and rectal pain.  Genitourinary: Negative.   Musculoskeletal: Negative.   Skin: Negative.   Neurological:  Negative.   All other systems reviewed and are negative.   Physical Exam Updated Vital Signs BP (!) 153/106 (BP Location: Left Arm)   Pulse 95   Temp 98.3 F (36.8 C) (Oral)   Resp 18   Ht 5\' 4"  (1.626 m)   Wt 54.4 kg   LMP 02/20/2023   SpO2 100%   BMI 20.60 kg/m  Physical Exam Vitals and nursing note reviewed.  Constitutional:      General: She is not in acute distress.    Appearance: She is  well-developed. She is not ill-appearing, toxic-appearing or diaphoretic.  HENT:     Head: Normocephalic and atraumatic.     Nose: Nose normal.     Mouth/Throat:     Mouth: Mucous membranes are moist.  Eyes:     Pupils: Pupils are equal, round, and reactive to light.  Cardiovascular:     Rate and Rhythm: Normal rate.     Pulses: Normal pulses.     Heart sounds: Normal heart sounds.  Pulmonary:     Effort: Pulmonary effort is normal. No respiratory distress.     Breath sounds: Normal breath sounds.  Abdominal:     General: Bowel sounds are normal. There is no distension.     Palpations: Abdomen is soft.     Tenderness: There is abdominal tenderness.     Comments: Mild generalized tenderness without rebound or guarding  Musculoskeletal:        General: Normal range of motion.     Cervical back: Normal range of motion.  Skin:    General: Skin is warm and dry.     Capillary Refill: Capillary refill takes less than 2 seconds.  Neurological:     General: No focal deficit present.     Mental Status: She is alert and oriented to person, place, and time.  Psychiatric:        Mood and Affect: Mood normal.     ED Results / Procedures / Treatments   Labs (all labs ordered are listed, but only abnormal results are displayed) Labs Reviewed  CBC WITH DIFFERENTIAL/PLATELET  COMPREHENSIVE METABOLIC PANEL  LIPASE, BLOOD  URINALYSIS, ROUTINE W REFLEX MICROSCOPIC  PREGNANCY, URINE    EKG None  Radiology No results found.  Procedures Procedures    Medications Ordered in ED Medications  sodium chloride 0.9 % bolus 1,000 mL (has no administration in time range)  ondansetron (ZOFRAN) injection 4 mg (has no administration in time range)    ED Course/ Medical Decision Making/ A&P      32 year old here for evaluation abdominal pain, nausea and vomiting.  Left AGAINST MEDICAL ADVICE yesterday.  Reviewed her labs from yesterday.  She stated she felt improved which is why she left  however started vomiting again when she got home.  Her abdomen is soft, she does have some generalized tenderness however no focal pain.  States she typically has nausea and vomiting at the beginning of her menses which she is due for.  Will plan on labs and reassess.  Labs personally viewed and interpreted:  Nursing let me know that patient had refused her labs.  I went and spoke with patient.  We initially discussed leaving AGAINST MEDICAL ADVICE as she stated she wanted to leave the hospital.  After speaking with patient she is now amenable to labs, IV fluids and antiemetics.  Nursing made me aware that patient eloped from the emergency department prior to the IV placement and AGAINST MEDICAL ADVISE. I did not  get to speak with her before she eloped the Emergency Department.  We discussed the nature and purpose, risks and benefits, as well as, the alternatives of treatment. Time was given to allow the opportunity to ask questions and consider their options, and after the discussion, the patient decided to refuse the offerred treatment. The patient was informed that refusal could lead to, but was not limited to, death, permanent disability, or severe pain. If present, I asked the relatives or significant others to dissuade them without success. Prior to refusing, I determined that the patient had the capacity to make their decision and understood the consequences of that decision. After refusal, I made every reasonable opportunity to treat them to the best of my ability.  The patient was notified that they may return to the emergency department at any time for further treatment.                                 Medical Decision Making Amount and/or Complexity of Data Reviewed External Data Reviewed: labs, radiology and notes. Labs: ordered. Decision-making details documented in ED Course.  Risk OTC drugs. Prescription drug management. Decision regarding hospitalization. Diagnosis or treatment  significantly limited by social determinants of health.          Final Clinical Impression(s) / ED Diagnoses Final diagnoses:  Nausea and vomiting, unspecified vomiting type    Rx / DC Orders ED Discharge Orders     None         Shaylynne Lunt A, PA-C 03/15/23 1610    Linwood Dibbles, MD 03/15/23 2016

## 2023-04-12 ENCOUNTER — Other Ambulatory Visit: Payer: Self-pay | Admitting: Physical Medicine and Rehabilitation

## 2023-04-13 ENCOUNTER — Encounter (HOSPITAL_COMMUNITY): Payer: Self-pay

## 2023-04-13 ENCOUNTER — Other Ambulatory Visit: Payer: Self-pay

## 2023-04-13 ENCOUNTER — Emergency Department (HOSPITAL_COMMUNITY)
Admission: EM | Admit: 2023-04-13 | Discharge: 2023-04-14 | Disposition: A | Discharge status: Other Acute Inpatient Hospital | Payer: No Typology Code available for payment source | Attending: Emergency Medicine | Admitting: Emergency Medicine

## 2023-04-13 DIAGNOSIS — F17210 Nicotine dependence, cigarettes, uncomplicated: Secondary | ICD-10-CM | POA: Insufficient documentation

## 2023-04-13 DIAGNOSIS — Z9151 Personal history of suicidal behavior: Secondary | ICD-10-CM | POA: Diagnosis not present

## 2023-04-13 DIAGNOSIS — F419 Anxiety disorder, unspecified: Secondary | ICD-10-CM | POA: Insufficient documentation

## 2023-04-13 DIAGNOSIS — F3111 Bipolar disorder, current episode manic without psychotic features, mild: Secondary | ICD-10-CM | POA: Diagnosis not present

## 2023-04-13 DIAGNOSIS — F1120 Opioid dependence, uncomplicated: Secondary | ICD-10-CM | POA: Diagnosis not present

## 2023-04-13 DIAGNOSIS — F1220 Cannabis dependence, uncomplicated: Secondary | ICD-10-CM | POA: Diagnosis present

## 2023-04-13 DIAGNOSIS — F603 Borderline personality disorder: Secondary | ICD-10-CM | POA: Insufficient documentation

## 2023-04-13 DIAGNOSIS — F1920 Other psychoactive substance dependence, uncomplicated: Secondary | ICD-10-CM | POA: Insufficient documentation

## 2023-04-13 DIAGNOSIS — Z91040 Latex allergy status: Secondary | ICD-10-CM | POA: Insufficient documentation

## 2023-04-13 DIAGNOSIS — R112 Nausea with vomiting, unspecified: Secondary | ICD-10-CM | POA: Diagnosis present

## 2023-04-13 DIAGNOSIS — Z560 Unemployment, unspecified: Secondary | ICD-10-CM | POA: Insufficient documentation

## 2023-04-13 DIAGNOSIS — Z046 Encounter for general psychiatric examination, requested by authority: Secondary | ICD-10-CM

## 2023-04-13 DIAGNOSIS — F3110 Bipolar disorder, current episode manic without psychotic features, unspecified: Secondary | ICD-10-CM

## 2023-04-13 DIAGNOSIS — F1910 Other psychoactive substance abuse, uncomplicated: Secondary | ICD-10-CM

## 2023-04-13 DIAGNOSIS — F29 Unspecified psychosis not due to a substance or known physiological condition: Secondary | ICD-10-CM

## 2023-04-13 DIAGNOSIS — R259 Unspecified abnormal involuntary movements: Secondary | ICD-10-CM | POA: Insufficient documentation

## 2023-04-13 LAB — I-STAT BETA HCG BLOOD, ED (MC, WL, AP ONLY): I-stat hCG, quantitative: <5 m[IU]/mL (ref <5)

## 2023-04-13 LAB — CBC WITH DIFFERENTIAL/PLATELET
Abs Immature Granulocytes: 0.04 10*3/uL (ref 0.00–0.07)
Basophils Absolute: 0 10*3/uL (ref 0.0–0.1)
Basophils Relative: 0 %
Eosinophils Absolute: 0 10*3/uL (ref 0.0–0.5)
Eosinophils Relative: 0 %
HCT: 43.9 % (ref 36.0–46.0)
Hemoglobin: 14.7 g/dL (ref 12.0–15.0)
Immature Granulocytes: 0 %
Lymphocytes Relative: 14 %
Lymphs Abs: 2 10*3/uL (ref 0.7–4.0)
MCH: 28.9 pg (ref 26.0–34.0)
MCHC: 33.5 g/dL (ref 30.0–36.0)
MCV: 86.2 fL (ref 80.0–100.0)
Monocytes Absolute: 0.7 10*3/uL (ref 0.1–1.0)
Monocytes Relative: 5 %
Neutro Abs: 11.4 10*3/uL — ABNORMAL HIGH (ref 1.7–7.7)
Neutrophils Relative %: 81 %
Platelets: 335 10*3/uL (ref 150–400)
RBC: 5.09 MIL/uL (ref 3.87–5.11)
RDW: 14.2 % (ref 11.5–15.5)
WBC: 14.2 10*3/uL — ABNORMAL HIGH (ref 4.0–10.5)
nRBC: 0 % (ref 0.0–0.2)

## 2023-04-13 LAB — COMPREHENSIVE METABOLIC PANEL
ALT: 15 U/L (ref 0–44)
AST: 20 U/L (ref 15–41)
Albumin: 5.2 g/dL — ABNORMAL HIGH (ref 3.5–5.0)
Alkaline Phosphatase: 81 U/L (ref 38–126)
Anion gap: 15 (ref 5–15)
BUN: 19 mg/dL (ref 6–20)
CO2: 30 mmol/L (ref 22–32)
Calcium: 10.5 mg/dL — ABNORMAL HIGH (ref 8.9–10.3)
Chloride: 92 mmol/L — ABNORMAL LOW (ref 98–111)
Creatinine, Ser: 0.91 mg/dL (ref 0.44–1.00)
GFR, Estimated: >60 mL/min (ref >60)
Glucose, Bld: 119 mg/dL — ABNORMAL HIGH (ref 70–99)
Potassium: 3.4 mmol/L — ABNORMAL LOW (ref 3.5–5.1)
Sodium: 137 mmol/L (ref 135–145)
Total Bilirubin: 0.7 mg/dL (ref 0.3–1.2)
Total Protein: 9.1 g/dL — ABNORMAL HIGH (ref 6.5–8.1)

## 2023-04-13 LAB — URINALYSIS, ROUTINE W REFLEX MICROSCOPIC
Bilirubin Urine: NEGATIVE
Glucose, UA: NEGATIVE mg/dL
Ketones, ur: 5 mg/dL — AB
Leukocytes,Ua: NEGATIVE
Nitrite: NEGATIVE
Protein, ur: 30 mg/dL — AB
Specific Gravity, Urine: 1.014 (ref 1.005–1.030)
pH: 8 (ref 5.0–8.0)

## 2023-04-13 LAB — RAPID URINE DRUG SCREEN, HOSP PERFORMED
Amphetamines: NOT DETECTED
Barbiturates: NOT DETECTED
Benzodiazepines: POSITIVE — AB
Cocaine: POSITIVE — AB
Opiates: POSITIVE — AB
Tetrahydrocannabinol: POSITIVE — AB

## 2023-04-13 LAB — SALICYLATE LEVEL: Salicylate Lvl: <7 mg/dL — ABNORMAL LOW (ref 7.0–30.0)

## 2023-04-13 LAB — ETHANOL: Alcohol, Ethyl (B): <10 mg/dL (ref <10)

## 2023-04-13 LAB — LIPASE, BLOOD: Lipase: 35 U/L (ref 11–51)

## 2023-04-13 MED ORDER — SODIUM CHLORIDE 0.9 % IV BOLUS
1000.0000 mL | Freq: Once | INTRAVENOUS | Status: AC
  Administered 2023-04-13: 1000 mL via INTRAVENOUS

## 2023-04-13 MED ORDER — HYDROXYZINE HCL 25 MG PO TABS
25.0000 mg | ORAL_TABLET | Freq: Three times a day (TID) | ORAL | Status: DC | PRN
  Administered 2023-04-13 – 2023-04-14 (×3): 25 mg via ORAL
  Filled 2023-04-13 (×3): qty 1

## 2023-04-13 MED ORDER — LORAZEPAM 1 MG PO TABS
1.0000 mg | ORAL_TABLET | Freq: Once | ORAL | Status: AC
  Administered 2023-04-13: 1 mg via ORAL
  Filled 2023-04-13: qty 1

## 2023-04-13 MED ORDER — DROPERIDOL 2.5 MG/ML IJ SOLN
2.5000 mg | Freq: Once | INTRAMUSCULAR | Status: AC
  Administered 2023-04-13: 2.5 mg via INTRAVENOUS
  Filled 2023-04-13: qty 2

## 2023-04-13 MED ORDER — TRAZODONE HCL 100 MG PO TABS
100.0000 mg | ORAL_TABLET | Freq: Every evening | ORAL | Status: DC | PRN
  Administered 2023-04-14: 100 mg via ORAL
  Filled 2023-04-13: qty 1

## 2023-04-13 MED ORDER — DICYCLOMINE HCL 10 MG PO CAPS
10.0000 mg | ORAL_CAPSULE | Freq: Once | ORAL | Status: AC
  Administered 2023-04-13: 10 mg via ORAL
  Filled 2023-04-13 (×2): qty 1

## 2023-04-13 MED ORDER — NICOTINE 14 MG/24HR TD PT24
14.0000 mg | MEDICATED_PATCH | Freq: Once | TRANSDERMAL | Status: DC
  Administered 2023-04-13: 14 mg via TRANSDERMAL
  Filled 2023-04-13: qty 1

## 2023-04-13 MED ORDER — ONDANSETRON HCL 4 MG/2ML IJ SOLN
4.0000 mg | Freq: Once | INTRAMUSCULAR | Status: AC
  Administered 2023-04-13: 4 mg via INTRAVENOUS
  Filled 2023-04-13: qty 2

## 2023-04-13 MED ORDER — OLANZAPINE 5 MG PO TBDP
5.0000 mg | ORAL_TABLET | Freq: Every day | ORAL | Status: DC
  Administered 2023-04-13: 5 mg via ORAL
  Filled 2023-04-13: qty 1

## 2023-04-13 MED ORDER — ONDANSETRON 4 MG PO TBDP
4.0000 mg | ORAL_TABLET | Freq: Once | ORAL | Status: AC
  Administered 2023-04-13: 4 mg via ORAL
  Filled 2023-04-13: qty 1

## 2023-04-13 NOTE — ED Notes (Signed)
Wanded by security while dressed in purple scrubs

## 2023-04-13 NOTE — ED Notes (Signed)
Pt loudly crying and yelling "I hate my life", and "my head hurts". Pt witnessed sticking her finger down her throat to make herself vomit on the floor.

## 2023-04-13 NOTE — ED Notes (Signed)
Patient is sleeping. I called [facility] to give report. They could not take report at this time they will call me back.

## 2023-04-13 NOTE — Progress Notes (Signed)
[facility] is now at capacity for adult patients due to high volume of walk ins that have been admitted.

## 2023-04-13 NOTE — ED Triage Notes (Signed)
Brought in by [employer] from home for IVC

## 2023-04-13 NOTE — ED Provider Notes (Signed)
[place] EMERGENCY DEPARTMENT AT [facility] Provider Note   CSN: [id number] Arrival date & time: 04/13/23  1130     History  Chief Complaint  Patient presents with   Psychiatric Evaluation    Gina Dorsey is a 32 y.o. female.  32 y.o female with a PMH of Cannabinoid use, borderline personality disorder, bipolar for evaluation of nausea and vomiting x2 days.  Patient reports she usually feels like this when she is about to get her menstrual cycle, states that she tried to call the ambulance yesterday, however she was not able to get to the hospital.  She reports she is now under IVC as she is trying to receive some medical care.  Has not tolerated any p.o. intake over the last 2 days.  She is teary-eyed on evaluation, refusing to give urine sample as she reports she feels dry.  She did take an entire bottle of Pepto-Bismol per patient in order to help with her abdominal pain without much improvement.  She does not have any fevers, urinary symptoms, other complaints.  The history is provided by the patient.       Home Medications Prior to Admission medications   Medication Sig Start Date End Date Taking? Authorizing Provider  anti-nausea (EMETROL) solution Take 10 mLs by mouth as needed for nausea or vomiting. Patient not taking: Reported on 07/23/2022    [provider]  dicyclomine (BENTYL) 10 MG capsule TAKE 2 CAPSULES BY MOUTH 4 TIMES DAILY AS NEEDED FOR SPASMS. 03/15/23   Tressia Danas, MD  dicyclomine (BENTYL) 20 MG tablet Take 1 tablet (20 mg total) by mouth 2 (two) times daily. 03/03/23   Mardene Sayer, MD  famotidine (PEPCID) 20 MG tablet Take 1 tablet (20 mg total) by mouth 2 (two) times daily. Patient not taking: Reported on 07/23/2022 12/10/21   Tressia Danas, MD  fluconazole (DIFLUCAN) 150 MG tablet Take 1 tablet (150 mg total) by mouth daily. 07/23/22   Raspet, Erin K, PA-C  gabapentin (NEURONTIN) 300 MG capsule TAKE 1 CAPSULE(300 MG) BY  MOUTH THREE TIMES DAILY 04/12/23   Juanda Chance, NP  methocarbamol (ROBAXIN) 500 MG tablet Take 1 tablet (500 mg total) by mouth every 8 (eight) hours as needed for muscle spasms. Patient not taking: Reported on 07/23/2022 07/23/22   Raspet, Denny Peon K, PA-C  OLANZapine (ZYPREXA) 5 MG tablet Take 1 tablet (5 mg total) by mouth at bedtime. 07/25/22   Jackelyn Poling, NP  omeprazole (PRILOSEC) 40 MG capsule Take 1 capsule (40 mg total) by mouth daily. Patient not taking: Reported on 07/23/2022 08/03/21   Tressia Danas, MD  ondansetron (ZOFRAN-ODT) 4 MG disintegrating tablet Take 1 tablet (4 mg total) by mouth every 8 (eight) hours as needed. 03/03/23   Mardene Sayer, MD  ondansetron (ZOFRAN-ODT) 8 MG disintegrating tablet Take 1 tablet (8 mg total) by mouth every 8 (eight) hours as needed for nausea or vomiting. Patient not taking: Reported on 07/23/2022 04/06/21   Linwood Dibbles, MD  Probiotic Product (PROBIOTIC ADVANCED PO) Take 1 capsule by mouth daily. Patient not taking: Reported on 07/23/2022    [provider]      Allergies    Mucinex [guaifenesin er], Reglan [metoclopramide], Sulfamethoxazole-trimethoprim, Sulfa antibiotics, Latex, Morphine and codeine, Nitrofurantoin, Wellbutrin [bupropion], and Celexa [citalopram hydrobromide]    Review of Systems   Review of Systems  Constitutional:  Negative for fever.  Respiratory:  Negative for shortness of breath.   Cardiovascular:  Negative  for chest pain.  Gastrointestinal:  Positive for abdominal pain, nausea and vomiting.  Genitourinary:  Negative for difficulty urinating.  All other systems reviewed and are negative.   Physical Exam Updated Vital Signs BP (!) 142/97 (BP Location: Right Arm)   Pulse (!) 108   Temp 97.7 F (36.5 C) (Oral)   Resp 18   Ht 5\' 4"  (1.626 m)   Wt 54.4 kg   LMP 02/20/2023   SpO2 99%   BMI 20.59 kg/m  Physical Exam Vitals and nursing note reviewed.  Constitutional:      Comments: Teary eyed on  exam screaming.   HENT:     Head: Normocephalic and atraumatic.     Mouth/Throat:     Mouth: Mucous membranes are moist.  Eyes:     Pupils: Pupils are equal, round, and reactive to light.  Cardiovascular:     Rate and Rhythm: Normal rate.  Abdominal:     General: Abdomen is flat.     Palpations: Abdomen is soft.     Tenderness: There is no abdominal tenderness.     Comments: NO guarding, no rebound. Bowel sounds present throughout.   Musculoskeletal:     Cervical back: Normal range of motion and neck supple.  Skin:    General: Skin is warm and dry.  Neurological:     Mental Status: She is alert and oriented to person, place, and time.  Psychiatric:        Attention and Perception: She is inattentive.        Mood and Affect: Affect is angry and tearful.        Speech: Speech is rapid and pressured.        Behavior: Behavior is agitated and hyperactive.     ED Results / Procedures / Treatments   Labs (all labs ordered are listed, but only abnormal results are displayed) Labs Reviewed  CBC WITH DIFFERENTIAL/PLATELET - Abnormal; Notable for the following components:      Result Value   WBC 14.2 (*)    Neutro Abs 11.4 (*)    All other components within normal limits  COMPREHENSIVE METABOLIC PANEL - Abnormal; Notable for the following components:   Potassium 3.4 (*)    Chloride 92 (*)    Glucose, Bld 119 (*)    Calcium 10.5 (*)    Total Protein 9.1 (*)    Albumin 5.2 (*)    All other components within normal limits  URINALYSIS, ROUTINE W REFLEX MICROSCOPIC - Abnormal; Notable for the following components:   APPearance HAZY (*)    Hgb urine dipstick SMALL (*)    Ketones, ur 5 (*)    Protein, ur 30 (*)    Bacteria, UA RARE (*)    All other components within normal limits  SALICYLATE LEVEL - Abnormal; Notable for the following components:   Salicylate Lvl <7.0 (*)    All other components within normal limits  LIPASE, BLOOD  ETHANOL  I-STAT BETA HCG BLOOD, ED ([facility], [facility],  [facility] ONLY)    EKG None  Radiology No results found.  Procedures Procedures    Medications Ordered in ED Medications  ondansetron (ZOFRAN) injection 4 mg (4 mg Intravenous Given 04/13/23 1237)  sodium chloride 0.9 % bolus 1,000 mL (0 mLs Intravenous Stopped 04/13/23 1405)  droperidol (INAPSINE) 2.5 MG/ML injection 2.5 mg (2.5 mg Intravenous Given 04/13/23 1330)  LORazepam (ATIVAN) tablet 1 mg (1 mg Oral Given 04/13/23 1528)    ED Course/ Medical Decision Making/ A&P  Medical Decision Making Amount and/or Complexity of Data Reviewed Labs: ordered.  Risk OTC drugs. Prescription drug management.   This patient presents to the ED for concern of abdominal pain, this involves a number of treatment options, and is a complaint that carries with it a high risk of complications and morbidity.  The differential diagnosis includes obstruction, constipation versus mental illness.    Co morbidities: Discussed in HPI   Brief History:  See HPI  EMR reviewed including pt PMHx, past surgical history and past visits to ER.   See HPI for more details   Lab Tests:  I ordered and independently interpreted labs.  The pertinent results include:    I personally reviewed all laboratory work and imaging. Metabolic panel without any acute abnormality specifically kidney function within normal limits and no significant electrolyte abnormalities. CBC without leukocytosis or significant anemia.   Imaging Studies:  No imaging studies ordered for this patient  Cardiac Monitoring:  The patient was maintained on a cardiac monitor.  I personally viewed and interpreted the cardiac monitored which showed an underlying rhythm of: NSR, Qtc 437 EKG non-ischemic   Medicines ordered:  I ordered medication including bolus, zofran, droperidol  for abdominal pain Reevaluation of the patient after these medicines showed that the patient improved I have reviewed the  patients home medicines and have made adjustments as needed  Reevaluation:  After the interventions noted above I re-evaluated patient and found that they have :stayed the same   Social Determinants of Health:  The patient's social determinants of health were a factor in the care of this patient   Problem List / ED Course:  Patient with underlying can avoid disorder here under IVC after being a try to herself.  Per family who IVC the patient, patient is taking medications that were not prescribed to her, in addition she is saying "I am going to kill myself ", she was placed under IVC.  She does endorse having abdominal pain that is been ongoing for the past 2 days, decrease in oral intake, has not been able to eat or drink.  Did take a whole bottle of Pepto-Bismol without any improvement in her symptoms.  Labs here are benign, slight leukocytosis, no signs of anemia.  CMP for some decreased potassium.  LFTs are within normal limits, creatinine functions unremarkable.  UA without any nitrites or leukocytes to suggest urinary component.  Lipase levels normal.  She was given Zofran, bolus, droperidol in order to help with abdominal pain.  Patient does state " I am a frequent flyer just check my chart". She is somewhat tangential and aggressive with this provider.  She was given Ativan to help with anxiety, she is requesting a nicotine patch which was also given to her.  She is requesting a second dose of Ativan at this time.  She remains hemodynamically stable, I do feel that patient needs further TTS evaluation.  Patient is hemodynamically stable and medically clear for TTS evaluation.   Dispostion:  She is medically clear for TTS evaluation, patient moved to purple zone.    Portions of this note were generated with Scientist, clinical (histocompatibility and immunogenetics). Dictation errors may occur despite best attempts at proofreading.   Final Clinical Impression(s) / ED Diagnoses Final diagnoses:  Involuntary commitment     Rx / DC Orders ED Discharge Orders     None         Claude Manges, PA-C 04/13/23 1753    Long, Arlyss Repress, MD 04/22/23 [phone]
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
## 2023-04-13 NOTE — ED Notes (Signed)
Patient unable to go to [facility] at this time. Walk in patients have been admitted and they don't have any more adult recliners left. Patient will stay at [facility].

## 2023-04-13 NOTE — ED Notes (Signed)
2 bags of belongings marked with pt stickers placed in locker 27 by me.  Her phone was in one of the bags and it was ringing.

## 2023-04-13 NOTE — ED Notes (Signed)
Pt belongings collected: Bookbag, pj pants, tshirt and shoes  Placed in [employer]

## 2023-04-13 NOTE — ED Notes (Addendum)
Pt refuses to give urine sample, says because she has not been able to eat, drink or keep anything down

## 2023-04-14 ENCOUNTER — Other Ambulatory Visit (HOSPITAL_COMMUNITY)
Admission: EM | Admit: 2023-04-14 | Discharge: 2023-04-16 | Disposition: A | Discharge status: Home / Self Care | Payer: No Typology Code available for payment source | Source: Home / Self Care | Admitting: Behavioral Health

## 2023-04-14 DIAGNOSIS — Z560 Unemployment, unspecified: Secondary | ICD-10-CM | POA: Insufficient documentation

## 2023-04-14 DIAGNOSIS — F314 Bipolar disorder, current episode depressed, severe, without psychotic features: Secondary | ICD-10-CM | POA: Insufficient documentation

## 2023-04-14 DIAGNOSIS — R259 Unspecified abnormal involuntary movements: Secondary | ICD-10-CM | POA: Diagnosis not present

## 2023-04-14 DIAGNOSIS — F1920 Other psychoactive substance dependence, uncomplicated: Secondary | ICD-10-CM

## 2023-04-14 DIAGNOSIS — F603 Borderline personality disorder: Secondary | ICD-10-CM | POA: Insufficient documentation

## 2023-04-14 DIAGNOSIS — F419 Anxiety disorder, unspecified: Secondary | ICD-10-CM | POA: Insufficient documentation

## 2023-04-14 DIAGNOSIS — Z9151 Personal history of suicidal behavior: Secondary | ICD-10-CM | POA: Insufficient documentation

## 2023-04-14 DIAGNOSIS — F1120 Opioid dependence, uncomplicated: Secondary | ICD-10-CM

## 2023-04-14 DIAGNOSIS — F1910 Other psychoactive substance abuse, uncomplicated: Secondary | ICD-10-CM | POA: Insufficient documentation

## 2023-04-14 MED ORDER — NAPROXEN 500 MG PO TABS: 500.0000 mg | ORAL_TABLET | Freq: Two times a day (BID) | ORAL | Status: DC | PRN

## 2023-04-14 MED ORDER — HYDROXYZINE HCL 25 MG PO TABS
25.0000 mg | ORAL_TABLET | Freq: Three times a day (TID) | ORAL | Status: DC | PRN
  Administered 2023-04-15 (×2): 25 mg via ORAL
  Filled 2023-04-14 (×2): qty 1

## 2023-04-14 MED ORDER — ADULT MULTIVITAMIN W/MINERALS CH
1.0000 | ORAL_TABLET | Freq: Every day | ORAL | Status: DC
  Administered 2023-04-15 – 2023-04-16 (×2): 1 via ORAL
  Filled 2023-04-14 (×3): qty 1

## 2023-04-14 MED ORDER — ONDANSETRON 4 MG PO TBDP
4.0000 mg | ORAL_TABLET | Freq: Four times a day (QID) | ORAL | Status: DC | PRN
  Administered 2023-04-14 – 2023-04-15 (×2): 4 mg via ORAL
  Filled 2023-04-14 (×2): qty 1

## 2023-04-14 MED ORDER — DROPERIDOL 2.5 MG/ML IJ SOLN: 1.2500 mg | Freq: Once | INTRAMUSCULAR | Status: DC

## 2023-04-14 MED ORDER — GABAPENTIN 100 MG PO CAPS
100.0000 mg | ORAL_CAPSULE | Freq: Three times a day (TID) | ORAL | Status: DC
  Administered 2023-04-14 – 2023-04-16 (×6): 100 mg via ORAL
  Filled 2023-04-14 (×6): qty 1

## 2023-04-14 MED ORDER — THIAMINE HCL 100 MG/ML IJ SOLN
100.0000 mg | Freq: Once | INTRAMUSCULAR | Status: DC
  Filled 2023-04-14: qty 2

## 2023-04-14 MED ORDER — METHOCARBAMOL 500 MG PO TABS
500.0000 mg | ORAL_TABLET | Freq: Three times a day (TID) | ORAL | Status: DC | PRN
  Administered 2023-04-14 – 2023-04-15 (×2): 500 mg via ORAL
  Filled 2023-04-14 (×2): qty 1

## 2023-04-14 MED ORDER — LIDOCAINE VISCOUS HCL 2 % MT SOLN
15.0000 mL | Freq: Once | OROMUCOSAL | Status: AC
  Administered 2023-04-14: 15 mL via ORAL
  Filled 2023-04-14: qty 15

## 2023-04-14 MED ORDER — MAGNESIUM HYDROXIDE 400 MG/5ML PO SUSP
30.0000 mL | Freq: Every day | ORAL | Status: DC | PRN
  Administered 2023-04-14: 30 mL via ORAL
  Filled 2023-04-14: qty 30

## 2023-04-14 MED ORDER — ALUM & MAG HYDROXIDE-SIMETH 200-200-20 MG/5ML PO SUSP
30.0000 mL | ORAL | Status: DC | PRN
  Administered 2023-04-15: 30 mL via ORAL
  Filled 2023-04-14: qty 30

## 2023-04-14 MED ORDER — CHLORDIAZEPOXIDE HCL 25 MG PO CAPS: 25.0000 mg | ORAL_CAPSULE | Freq: Four times a day (QID) | ORAL | Status: DC | PRN

## 2023-04-14 MED ORDER — DROPERIDOL 2.5 MG/ML IJ SOLN
2.5000 mg | Freq: Once | INTRAMUSCULAR | Status: AC
  Administered 2023-04-14: 2.5 mg via INTRAMUSCULAR
  Filled 2023-04-14: qty 2

## 2023-04-14 MED ORDER — PANTOPRAZOLE SODIUM 40 MG PO TBEC
40.0000 mg | DELAYED_RELEASE_TABLET | Freq: Every day | ORAL | Status: DC
  Administered 2023-04-14: 40 mg via ORAL
  Filled 2023-04-14: qty 1

## 2023-04-14 MED ORDER — OLANZAPINE 5 MG PO TBDP
5.0000 mg | ORAL_TABLET | Freq: Every day | ORAL | Status: DC
  Administered 2023-04-14: 5 mg via ORAL
  Filled 2023-04-14: qty 1

## 2023-04-14 MED ORDER — OLANZAPINE 5 MG PO TBDP
ORAL_TABLET | ORAL | Status: AC
  Filled 2023-04-14: qty 1

## 2023-04-14 MED ORDER — TRAZODONE HCL 50 MG PO TABS
50.0000 mg | ORAL_TABLET | Freq: Every evening | ORAL | Status: DC | PRN
  Administered 2023-04-14 – 2023-04-15 (×2): 50 mg via ORAL
  Filled 2023-04-14 (×2): qty 1

## 2023-04-14 MED ORDER — ONDANSETRON 4 MG PO TBDP
4.0000 mg | ORAL_TABLET | Freq: Three times a day (TID) | ORAL | Status: DC | PRN
  Administered 2023-04-14: 4 mg via ORAL
  Filled 2023-04-14: qty 1

## 2023-04-14 MED ORDER — HYOSCYAMINE SULFATE 0.125 MG PO TABS
0.2500 mg | ORAL_TABLET | Freq: Once | ORAL | Status: AC
  Administered 2023-04-14: 0.25 mg via ORAL
  Filled 2023-04-14: qty 2

## 2023-04-14 MED ORDER — LOPERAMIDE HCL 2 MG PO CAPS: 2.0000 mg | ORAL_CAPSULE | ORAL | Status: DC | PRN

## 2023-04-14 MED ORDER — ACETAMINOPHEN 325 MG PO TABS: 650.0000 mg | ORAL_TABLET | Freq: Four times a day (QID) | ORAL | Status: DC | PRN

## 2023-04-14 MED ORDER — DICYCLOMINE HCL 20 MG PO TABS
20.0000 mg | ORAL_TABLET | Freq: Four times a day (QID) | ORAL | Status: DC | PRN
  Administered 2023-04-14 – 2023-04-15 (×2): 20 mg via ORAL
  Filled 2023-04-14 (×2): qty 1

## 2023-04-14 MED ORDER — ALUM & MAG HYDROXIDE-SIMETH 200-200-20 MG/5ML PO SUSP
30.0000 mL | Freq: Once | ORAL | Status: AC
  Administered 2023-04-14: 30 mL via ORAL
  Filled 2023-04-14: qty 30

## 2023-04-14 MED ORDER — OLANZAPINE 5 MG PO TBDP
2.5000 mg | ORAL_TABLET | Freq: Every day | ORAL | Status: DC
  Administered 2023-04-14: 2.5 mg via ORAL

## 2023-04-14 NOTE — ED Notes (Signed)
Patient has been moaning and crying. C/o vomiting and stomach pain. ER provider notified. Security had to direct patient back to her room.

## 2023-04-14 NOTE — ED Notes (Signed)
Patient alert and oriented x4, flat affect with depressed mood. Denies SI, HI, and AVH. Pt reports stomach discomfort and not having a BM in several days.  PRN medication provided to pt.  Scheduled medications administered to patient, per MD orders. Support and encouragement provided.  Routine safety checks conducted every 15 minutes.  Patient informed to notify staff with problems or concerns. No adverse drug reactions noted. Patient contracts for safety at this time. Patient compliant with medications and treatment plan. Patient receptive, calm, and cooperative. Patient interacts well with others on the unit.  Patient remains safe at this time.

## 2023-04-14 NOTE — Group Note (Signed)
Group Topic: Healthy Self Image and Positive Change  Group Date: 04/14/2023 Start Time: 1630 End Time: 1700 Facilitators: Jenean Lindau, RN  Department: [facility]  Number of Participants: 3  Group Focus: coping skills Treatment Modality:  Solution-Focused Therapy Interventions utilized were patient education Purpose: express feelings  Name: MARKENZIE MCDONNOUGH Date of Birth: January 26, 1991  MR: [id number]    Level of Participation: active Quality of Participation: attentive and cooperative Interactions with others: gave feedback Mood/Affect: appropriate Triggers (if applicable):   Cognition: goal directed and loose Progress: Gaining insight Response:   Plan: follow-up needed  Patients Problems:  Patient Active Problem List   Diagnosis Date Noted   Polysubstance (including opioids) dependence, daily use (HCC) 04/14/2023   Substance abuse (HCC) 04/14/2023   Bipolar I disorder with mania (HCC) 04/13/2023   Bipolar disorder (HCC) 07/24/2022   Diarrhea 01/13/2021   Nausea and vomiting 01/13/2021   Lower abdominal pain 01/13/2021   Depression with suicidal ideation 09/28/2013   Bipolar I disorder, most recent episode (or current) depressed, severe, without mention of psychotic behavior 09/28/2013   Cannabis dependence (HCC) 05/16/2012   Borderline personality disorder (HCC) 05/16/2012   Generalized anxiety disorder 05/16/2012   Panic disorder without agoraphobia 05/16/2012

## 2023-04-14 NOTE — ED Notes (Signed)
Patient resting with no sxs of distress noted - patient has been awake on and off - provider asked if we can give her another dose of trazodone - will continue to monitor for safety

## 2023-04-14 NOTE — ED Notes (Signed)
Patient has been belching loudly, sticking her fingers in her throat to make herself gag. Opening and closing her room door. This has been all night.

## 2023-04-14 NOTE — Group Note (Signed)
Group Topic: Overcoming Obstacles  Group Date: 04/14/2023 Start Time: 0200 End Time: 9562 Facilitators: Lenny Pastel  Department: [facility]  Number of Participants: 4  Group Focus: daily focus, feeling awareness/expression, personal responsibility, and self-awareness Treatment Modality:  Behavior Modification Therapy, Cognitive Behavioral Therapy, and Spiritual Interventions utilized were exploration, mental fitness, and support Purpose: enhance coping skills, explore maladaptive thinking, express feelings, express irrational fears, improve communication skills, increase insight, regain self-worth, reinforce self-care, relapse prevention strategies, and trigger / craving management  Name: Gina Dorsey Date of Birth: Dec 24, 1990  MR: [id number]    Level of Participation: active Quality of Participation: attentive and cooperative Interactions with others: gave feedback Mood/Affect: appropriate and tearful Triggers (if applicable): None identified at this time Cognition: coherent/clear and insightful Progress: Gaining insight Response: Patient actively participated in group on today. Patient was able to identify areas or times in her life where she felt like she could have handled certain situations differently. Patient reports she use to be down on others and then reports the tables turned. Patient reports understanding the importance of humility and being more self-aware of how she comes off towards others. Patient reports that the video clip definitely made her reflect on the way she has viewed her situation and decision making. Patient was able to provide feedback to her peers. No issues to report.  Plan: referral / recommendations  Patients Problems:  Patient Active Problem List   Diagnosis Date Noted   Polysubstance (including opioids) dependence, daily use (HCC) 04/14/2023   Bipolar I disorder with mania (HCC) 04/13/2023   Bipolar disorder  (HCC) 07/24/2022   Diarrhea 01/13/2021   Nausea and vomiting 01/13/2021   Lower abdominal pain 01/13/2021   Depression with suicidal ideation 09/28/2013   Bipolar I disorder, most recent episode (or current) depressed, severe, without mention of psychotic behavior 09/28/2013   Cannabis dependence (HCC) 05/16/2012   Borderline personality disorder (HCC) 05/16/2012   Generalized anxiety disorder 05/16/2012   Panic disorder without agoraphobia 05/16/2012

## 2023-04-14 NOTE — ED Provider Notes (Signed)
Facility Based Crisis Admission H&P  Date: 04/14/23 Patient Name: Gina Dorsey MRN: [id number] Chief Complaint: Patient transferred from the [facility] under IVC to the [facility] for   Diagnoses:  Final diagnoses:  Borderline personality disorder (HCC)  Polysubstance abuse (HCC)  Bipolar disorder, current episode depressed, severe, without psychotic features (HCC)    HPI: Gina Dorsey is a 32 year old female patient with a past psychiatric history significant for bipolar, depression, anxiety, panic attacks, polysubstance abuse and past suicide attempts who was transferred from the [facility] emergency department to the [facility] facility based crisis unit today for substance abuse treatment and mood stabilization.  Per IVC, "RESPONDENT IS NOT CURRENTLY YNDER A DOCTOR CARE; RESPONDENT HAS NOT BEEN PRESCRIBED ANY MEDICATIONS; RESPONDENT IS TAKING PERCOCET PILLS  THAT HAVE NOT PRESCRIBED TO HER; RESPONDENT HAS NOT BEEN EATING OR DRINKING IN THE LAST WEEK; RESPONDENT IS BEING AGGRESSIVE AND HOSTILE WITH FAMILY MEMBERS; RESPONDENT IS THROWING THINGS  AND KICKING DOORS AT HER APT; RESPONDENT IS A DANGER TO HERSELF."   On evaluation, patient is alert and oriented X 4. Her thought process is circumstantial and speech is clear and coherent. Her mood is anxious and depressed and affect is congruent. She presents with bizarre behaviors on exam, as she is noted to be turning her face excessively as if she has a tic disorder and when questioned about her repetitive facial movements, she then starts moving her jaw and states that she believes she is having a stroke. Patient examined, no facial drooping noted, no asymmetry noted, no slurred speech noted, orientation intact, no chest pain, no shortness of breath, and no upper or lower extremity weakness. As this provider continued with the assessment, patient's repetitive facial movements subsided. Patient reports abusing benzodiazepines; Xanax, Valium, and  Klonopin every day for the past 2 years, on average 5 to 10 pills/day. She reports that she last consumed benzodiazepines 3 days ago. She reports abusing opioids; Roxicet, Percocet, Vicodin, and morphine every day for the past 2 years, on average 5 to 10 pills/day, more than  she uses benzos. She states that she last consumed opiates 3 days ago. She reports using cocaine once , "a line" 3-4 days ago. UDS positive for benzodiazepines, opiates, cocaine, and THC. She reports withdrawal symptoms of nausea, muscle spasms, body aches, and bilateral hand tremors. She does not report alcohol use. She denies past substance abuse treatments. She denies outpatient psychiatry or therapy at this time. She reports taking Zyprexa in the past for mood disorder. She reports a history of fibromyalgia and states that she takes gabapentin 300 mg 3 times a day that prescribed by her PCP. She reports feeling depressed for the past 10 months due to being alone, unable to keep relationships, unable to keep a job and cannot have kids. She states that she was starving herself for days because she did not want to be here anymore. She states that she is now getting her appetite back. She denies suicidal ideations at this time. She denies homicidal ideations. She denies auditory or visual hallucinations. She reports feeling depressed. PHQ-9 on exam is 26. She reports multiple past suicide attempts, however, she does not further elaborate. She reports multiple inpatient psychiatric hospitalizations. She states that she was last hospitalized at [facility] last year. She states that she has a court date on June 4 for fraud charges. Patient resides alone in an apartment. Patient is unemployed and states that she receives financial support from her father.  PHQ 2-9:  Flowsheet Row ED from 04/14/2023 in [facility] Office Visit from 04/13/2016 in [facility]  Thoughts that you  would be better off dead, or of hurting yourself in some way Nearly every day Not at all  PHQ-9 Total Score 26 1       Flowsheet Row ED from 04/14/2023 in [facility] ED from 04/13/2023 in [facility] Emergency Department at [facility] ED from 03/15/2023 in [facility] Emergency Department at [facility]  C-SSRS RISK CATEGORY No Risk Error: Question 2 not populated No Risk         Total Time spent with patient: 30 minutes  Musculoskeletal  Strength & Muscle Tone: within normal limits Gait & Station: normal Patient leans: N/A  Psychiatric Specialty Exam  Presentation General Appearance:  Disheveled  Eye Contact: Fleeting  Speech: Clear and Coherent  Speech Volume: Normal  Handedness: Right   Mood and Affect  Mood: Anxious; Dysphoric  Affect: Congruent   Thought Process  Thought Processes: Coherent  Descriptions of Associations:Circumstantial  Orientation:Full (Time, Place and Person)  Thought Content:Logical    Hallucinations:Hallucinations: None  Ideas of Reference:None  Suicidal Thoughts:Suicidal Thoughts: No  Homicidal Thoughts:Homicidal Thoughts: No   Sensorium  Memory: Immediate Fair; Recent Fair; Remote Fair  Judgment: Poor  Insight: Poor   Executive Functions  Concentration: Poor  Attention Span: Fair  Recall: [employer] of Knowledge: Fair  Language: Fair   Psychomotor Activity  Psychomotor Activity: Psychomotor Activity: Restlessness   Assets  Assets: Communication Skills; Desire for Improvement; Housing; Physical Health; Leisure Time; Social Support   Sleep  Sleep: Sleep: Fair Number of Hours of Sleep: 0 (Has not eaten or been able to sleep over the last 2-3 days)   Nutritional Assessment (For OBS and FBC admissions only) Has the patient had a weight loss or gain of 10 pounds or more in the last 3 months?: Yes Has the patient had a decrease in food  intake/or appetite?: Yes Does the patient have dental problems?: No Does the patient have eating habits or behaviors that may be indicators of an eating disorder including binging or inducing vomiting?: No Has the patient recently lost weight without trying?: 2.0 Has the patient been eating poorly because of a decreased appetite?: 1 Malnutrition Screening Tool Score: 3 Nutritional Assessment Referrals: Medication/Tx changes    Physical Exam ROS  Last menstrual period 02/20/2023. There is no height or weight on file to calculate BMI.  Past Psychiatric History: past psychiatric history significant for bipolar, depression, anxiety, panic attacks, polysubstance abuse and past suicide attempts  Is the patient at risk to self? Yes  Has the patient been a risk to self in the past 6 months? Yes .    Has the patient been a risk to self within the distant past? Yes   Is the patient a risk to others? No   Has the patient been a risk to others in the past 6 months? No   Has the patient been a risk to others within the distant past? No   Past Medical History: Patient reports a history of fibromyalgia  Family History: Per patient, no known history  Social History: Single. Patient resides alone in an apartment. Patient is unemployed and states that she receives financial support from her father. Patient states that she has an upcoming court date on June 4 for fraud. No children.    Last Labs:  Admission on  04/13/2023, Discharged on 04/14/2023  Component Date Value Ref Range Status   WBC 04/13/2023 14.2 (H)  4.0 - 10.5 K/uL Final   RBC 04/13/2023 5.09  3.87 - 5.11 MIL/uL Final   Hemoglobin 04/13/2023 14.7  12.0 - 15.0 g/dL Final   HCT 40/98/1191 43.9  36.0 - 46.0 % Final   MCV 04/13/2023 86.2  80.0 - 100.0 fL Final   MCH 04/13/2023 28.9  26.0 - 34.0 pg Final   MCHC 04/13/2023 33.5  30.0 - 36.0 g/dL Final   RDW 47/82/9562 14.2  11.5 - 15.5 % Final   Platelets 04/13/2023 335  150 - 400 K/uL  Final   nRBC 04/13/2023 0.0  0.0 - 0.2 % Final   Neutrophils Relative % 04/13/2023 81  % Final   Neutro Abs 04/13/2023 11.4 (H)  1.7 - 7.7 K/uL Final   Lymphocytes Relative 04/13/2023 14  % Final   Lymphs Abs 04/13/2023 2.0  0.7 - 4.0 K/uL Final   Monocytes Relative 04/13/2023 5  % Final   Monocytes Absolute 04/13/2023 0.7  0.1 - 1.0 K/uL Final   Eosinophils Relative 04/13/2023 0  % Final   Eosinophils Absolute 04/13/2023 0.0  0.0 - 0.5 K/uL Final   Basophils Relative 04/13/2023 0  % Final   Basophils Absolute 04/13/2023 0.0  0.0 - 0.1 K/uL Final   Immature Granulocytes 04/13/2023 0  % Final   Abs Immature Granulocytes 04/13/2023 0.04  0.00 - 0.07 K/uL Final   Performed at [facility], [address] [place], [place] [address]   Sodium 04/13/2023 137  135 - 145 mmol/L Final   Potassium 04/13/2023 3.4 (L)  3.5 - 5.1 mmol/L Final   Chloride 04/13/2023 92 (L)  98 - 111 mmol/L Final   CO2 04/13/2023 30  22 - 32 mmol/L Final   Glucose, Bld 04/13/2023 119 (H)  70 - 99 mg/dL Final   Glucose reference range applies only to samples taken after fasting for at least 8 hours.   BUN 04/13/2023 19  6 - 20 mg/dL Final   Creatinine, Ser 04/13/2023 0.91  0.44 - 1.00 mg/dL Final   Calcium 57/84/6962 10.5 (H)  8.9 - 10.3 mg/dL Final   Total Protein 95/28/4132 9.1 (H)  6.5 - 8.1 g/dL Final   Albumin 44/11/270 5.2 (H)  3.5 - 5.0 g/dL Final   AST 53/66/4403 20  15 - 41 U/L Final   ALT 04/13/2023 15  0 - 44 U/L Final   Alkaline Phosphatase 04/13/2023 81  38 - 126 U/L Final   Total Bilirubin 04/13/2023 0.7  0.3 - 1.2 mg/dL Final   GFR, Estimated 04/13/2023 >60  >60 mL/min Final   Comment: (NOTE) Calculated using the CKD-EPI Creatinine Equation (2021)    Anion gap 04/13/2023 15  5 - 15 Final   Performed at [facility], [address], [place], [place] [address]   I-stat hCG, quantitative 04/13/2023 <5.0  <5 mIU/mL Final   Comment 3 04/13/2023          Final    Comment:   GEST. AGE      CONC.  (mIU/mL)   <=1 WEEK        5 - 50     2 WEEKS       50 - 500     3 WEEKS       100 - 10,000     4 WEEKS     1,000 - 30,000  FEMALE AND NON-PREGNANT FEMALE:     LESS THAN 5 mIU/mL    Lipase 04/13/2023 35  11 - 51 U/L Final   Performed at [facility], [address], [place], [place] [address]   Color, Urine 04/13/2023 YELLOW  YELLOW Final   APPearance 04/13/2023 HAZY (A)  CLEAR Final   Specific Gravity, Urine 04/13/2023 1.014  1.005 - 1.030 Final   pH 04/13/2023 8.0  5.0 - 8.0 Final   Glucose, UA 04/13/2023 NEGATIVE  NEGATIVE mg/dL Final   Hgb urine dipstick 04/13/2023 SMALL (A)  NEGATIVE Final   Bilirubin Urine 04/13/2023 NEGATIVE  NEGATIVE Final   Ketones, ur 04/13/2023 5 (A)  NEGATIVE mg/dL Final   Protein, ur 28/41/3244 30 (A)  NEGATIVE mg/dL Final   Nitrite 11/30/7251 NEGATIVE  NEGATIVE Final   Leukocytes,Ua 04/13/2023 NEGATIVE  NEGATIVE Final   RBC / HPF 04/13/2023 0-5  0 - 5 RBC/hpf Final   WBC, UA 04/13/2023 0-5  0 - 5 WBC/hpf Final   Bacteria, UA 04/13/2023 RARE (A)  NONE SEEN Final   Squamous Epithelial / HPF 04/13/2023 11-20  0 - 5 /HPF Final   Performed at [facility], [address] [place], [place] [address]   Alcohol, Ethyl (B) 04/13/2023 <10  <10 mg/dL Final   Comment: (NOTE) Lowest detectable limit for serum alcohol is 10 mg/dL.  For medical purposes only. Performed at [facility], [address] [place], [place] [address]    Salicylate Lvl 04/13/2023 <7.0 (L)  7.0 - 30.0 mg/dL Final   Performed at [facility], [address] [place], [place] [address]   Opiates 04/13/2023 POSITIVE (A)  NONE DETECTED Final   Cocaine 04/13/2023 POSITIVE (A)  NONE DETECTED Final   Benzodiazepines 04/13/2023 POSITIVE (A)  NONE DETECTED Final   Amphetamines 04/13/2023 NONE DETECTED  NONE DETECTED Final   Tetrahydrocannabinol 04/13/2023 POSITIVE (A)  NONE DETECTED  Final   Barbiturates 04/13/2023 NONE DETECTED  NONE DETECTED Final   Comment: (NOTE) DRUG SCREEN FOR MEDICAL PURPOSES ONLY.  IF CONFIRMATION IS NEEDED FOR ANY PURPOSE, NOTIFY LAB WITHIN 5 DAYS.  LOWEST DETECTABLE LIMITS FOR URINE DRUG SCREEN Drug Class                     Cutoff (ng/mL) Amphetamine and metabolites    1000 Barbiturate and metabolites    200 Benzodiazepine                 200 Opiates and metabolites        300 Cocaine and metabolites        300 THC                            50 Performed at [facility], [address] [place], [place] [address]   Admission on 03/14/2023, Discharged on 03/14/2023  Component Date Value Ref Range Status   WBC 03/14/2023 11.9 (H)  4.0 - 10.5 K/uL Final   RBC 03/14/2023 4.55  3.87 - 5.11 MIL/uL Final   Hemoglobin 03/14/2023 13.6  12.0 - 15.0 g/dL Final   HCT 33/29/5188 41.7  36.0 - 46.0 % Final   MCV 03/14/2023 91.6  80.0 - 100.0 fL Final   MCH 03/14/2023 29.9  26.0 - 34.0 pg Final   MCHC 03/14/2023 32.6  30.0 - 36.0 g/dL Final   RDW 41/66/0630 14.3  11.5 - 15.5 % Final  Platelets 03/14/2023 339  150 - 400 K/uL Final   nRBC 03/14/2023 0.0  0.0 - 0.2 % Final   Neutrophils Relative % 03/14/2023 76  % Final   Neutro Abs 03/14/2023 9.0 (H)  1.7 - 7.7 K/uL Final   Lymphocytes Relative 03/14/2023 17  % Final   Lymphs Abs 03/14/2023 2.1  0.7 - 4.0 K/uL Final   Monocytes Relative 03/14/2023 5  % Final   Monocytes Absolute 03/14/2023 0.6  0.1 - 1.0 K/uL Final   Eosinophils Relative 03/14/2023 2  % Final   Eosinophils Absolute 03/14/2023 0.2  0.0 - 0.5 K/uL Final   Basophils Relative 03/14/2023 0  % Final   Basophils Absolute 03/14/2023 0.0  0.0 - 0.1 K/uL Final   Immature Granulocytes 03/14/2023 0  % Final   Abs Immature Granulocytes 03/14/2023 0.03  0.00 - 0.07 K/uL Final   Performed at [facility], [address], [place], [place] [address]   Sodium 03/14/2023 137  135 - 145 mmol/L Final    Potassium 03/14/2023 4.8  3.5 - 5.1 mmol/L Final   HEMOLYSIS AT THIS LEVEL MAY AFFECT RESULT   Chloride 03/14/2023 101  98 - 111 mmol/L Final   CO2 03/14/2023 25  22 - 32 mmol/L Final   Glucose, Bld 03/14/2023 123 (H)  70 - 99 mg/dL Final   Glucose reference range applies only to samples taken after fasting for at least 8 hours.   BUN 03/14/2023 10  6 - 20 mg/dL Final   Creatinine, Ser 03/14/2023 0.97  0.44 - 1.00 mg/dL Final   Calcium 19/14/7829 9.2  8.9 - 10.3 mg/dL Final   Total Protein 56/21/3086 7.4  6.5 - 8.1 g/dL Final   Albumin 57/84/6962 4.1  3.5 - 5.0 g/dL Final   AST 95/28/4132 23  15 - 41 U/L Final   HEMOLYSIS AT THIS LEVEL MAY AFFECT RESULT   ALT 03/14/2023 11  0 - 44 U/L Final   HEMOLYSIS AT THIS LEVEL MAY AFFECT RESULT   Alkaline Phosphatase 03/14/2023 72  38 - 126 U/L Final   Total Bilirubin 03/14/2023 0.6  0.3 - 1.2 mg/dL Final   HEMOLYSIS AT THIS LEVEL MAY AFFECT RESULT   GFR, Estimated 03/14/2023 >60  >60 mL/min Final   Comment: (NOTE) Calculated using the CKD-EPI Creatinine Equation (2021)    Anion gap 03/14/2023 11  5 - 15 Final   Performed at [facility], [address] [place], [place] [address]   Color, Urine 03/14/2023 STRAW (A)  YELLOW Final   APPearance 03/14/2023 CLEAR  CLEAR Final   Specific Gravity, Urine 03/14/2023 1.004 (L)  1.005 - 1.030 Final   pH 03/14/2023 7.0  5.0 - 8.0 Final   Glucose, UA 03/14/2023 NEGATIVE  NEGATIVE mg/dL Final   Hgb urine dipstick 03/14/2023 NEGATIVE  NEGATIVE Final   Bilirubin Urine 03/14/2023 NEGATIVE  NEGATIVE Final   Ketones, ur 03/14/2023 NEGATIVE  NEGATIVE mg/dL Final   Protein, ur 27/25/3664 NEGATIVE  NEGATIVE mg/dL Final   Nitrite 40/34/7425 NEGATIVE  NEGATIVE Final   Leukocytes,Ua 03/14/2023 NEGATIVE  NEGATIVE Final   Performed at [facility], [address], [place], [place] [address]  Admission on 03/03/2023, Discharged on 03/03/2023  Component Date Value Ref Range Status    Lipase 03/03/2023 46  11 - 51 U/L Final   Performed at [facility], [address], [place], [place] [address]   Sodium 03/03/2023 137  135 - 145 mmol/L Final  Potassium 03/03/2023 3.7  3.5 - 5.1 mmol/L Final   Chloride 03/03/2023 100  98 - 111 mmol/L Final   CO2 03/03/2023 27  22 - 32 mmol/L Final   Glucose, Bld 03/03/2023 175 (H)  70 - 99 mg/dL Final   Glucose reference range applies only to samples taken after fasting for at least 8 hours.   BUN 03/03/2023 16  6 - 20 mg/dL Final   Creatinine, Ser 03/03/2023 0.85  0.44 - 1.00 mg/dL Final   Calcium 25/36/6440 9.9  8.9 - 10.3 mg/dL Final   Total Protein 34/74/2595 7.2  6.5 - 8.1 g/dL Final   Albumin 63/87/5643 4.4  3.5 - 5.0 g/dL Final   AST 32/95/1884 17  15 - 41 U/L Final   ALT 03/03/2023 11  0 - 44 U/L Final   Alkaline Phosphatase 03/03/2023 68  38 - 126 U/L Final   Total Bilirubin 03/03/2023 0.3  0.3 - 1.2 mg/dL Final   GFR, Estimated 03/03/2023 >60  >60 mL/min Final   Comment: (NOTE) Calculated using the CKD-EPI Creatinine Equation (2021)    Anion gap 03/03/2023 10  5 - 15 Final   Performed at [facility], [address], [place], [place] [address]   WBC 03/03/2023 10.7 (H)  4.0 - 10.5 K/uL Final   RBC 03/03/2023 4.16  3.87 - 5.11 MIL/uL Final   Hemoglobin 03/03/2023 12.3  12.0 - 15.0 g/dL Final   HCT 30/16/0109 37.1  36.0 - 46.0 % Final   MCV 03/03/2023 89.2  80.0 - 100.0 fL Final   MCH 03/03/2023 29.6  26.0 - 34.0 pg Final   MCHC 03/03/2023 33.2  30.0 - 36.0 g/dL Final   RDW 32/35/5732 14.4  11.5 - 15.5 % Final   Platelets 03/03/2023 338  150 - 400 K/uL Final   nRBC 03/03/2023 0.0  0.0 - 0.2 % Final   Performed at [facility], [address], [place], [place] [address]   hCG, Garey Ham Chain, Quant, S 03/03/2023 <1  <5 mIU/mL Final   Comment:          GEST. AGE      CONC.  (mIU/mL)   <=1 WEEK        5 - 50     2 WEEKS       50 - 500     3 WEEKS       100 -  10,000     4 WEEKS     1,000 - 30,000     5 WEEKS     3,500 - 115,000   6-8 WEEKS     12,000 - 270,000    12 WEEKS     15,000 - 220,000        FEMALE AND NON-PREGNANT FEMALE:     LESS THAN 5 mIU/mL Performed at [facility], [address] [place], [place] [address]    Color, Urine 03/03/2023 COLORLESS (A)  YELLOW Final   APPearance 03/03/2023 CLEAR  CLEAR Final   Specific Gravity, Urine 03/03/2023 1.035 (H)  1.005 - 1.030 Final   pH 03/03/2023 7.0  5.0 - 8.0 Final   Glucose, UA 03/03/2023 NEGATIVE  NEGATIVE mg/dL Final   Hgb urine dipstick 03/03/2023 NEGATIVE  NEGATIVE Final   Bilirubin Urine 03/03/2023 NEGATIVE  NEGATIVE Final   Ketones, ur 03/03/2023 NEGATIVE  NEGATIVE mg/dL Final   Protein, ur 37/62/8315 NEGATIVE  NEGATIVE mg/dL Final   Nitrite 17/61/6073 NEGATIVE  NEGATIVE Final   Leukocytes,Ua 03/03/2023 NEGATIVE  NEGATIVE Final   Performed at [facility], [address], [place], [place] [address]   TSH 03/03/2023 0.586  0.350 - 4.500 uIU/mL Final   Comment: Performed by a 3rd Generation assay with a functional sensitivity of <=0.01 uIU/mL. Performed at [facility], [address] [place], [place] [address]   Admission on 02/09/2023, Discharged on 02/09/2023  Component Date Value Ref Range Status   WBC 02/09/2023 14.7 (H)  4.0 - 10.5 K/uL Final   RBC 02/09/2023 4.45  3.87 - 5.11 MIL/uL Final   Hemoglobin 02/09/2023 13.2  12.0 - 15.0 g/dL Final   HCT 21/30/8657 40.8  36.0 - 46.0 % Final   MCV 02/09/2023 91.7  80.0 - 100.0 fL Final   MCH 02/09/2023 29.7  26.0 - 34.0 pg Final   MCHC 02/09/2023 32.4  30.0 - 36.0 g/dL Final   RDW 84/69/6295 14.1  11.5 - 15.5 % Final   Platelets 02/09/2023 359  150 - 400 K/uL Final   nRBC 02/09/2023 0.0  0.0 - 0.2 % Final   Neutrophils Relative % 02/09/2023 80  % Final   Neutro Abs 02/09/2023 11.8 (H)  1.7 - 7.7 K/uL Final   Lymphocytes Relative 02/09/2023 13  % Final   Lymphs Abs  02/09/2023 1.9  0.7 - 4.0 K/uL Final   Monocytes Relative 02/09/2023 5  % Final   Monocytes Absolute 02/09/2023 0.7  0.1 - 1.0 K/uL Final   Eosinophils Relative 02/09/2023 2  % Final   Eosinophils Absolute 02/09/2023 0.2  0.0 - 0.5 K/uL Final   Basophils Relative 02/09/2023 0  % Final   Basophils Absolute 02/09/2023 0.0  0.0 - 0.1 K/uL Final   Immature Granulocytes 02/09/2023 0  % Final   Abs Immature Granulocytes 02/09/2023 0.05  0.00 - 0.07 K/uL Final   Performed at [facility], [address], [place], [place] [address]   Sodium 02/09/2023 138  135 - 145 mmol/L Final   Potassium 02/09/2023 4.1  3.5 - 5.1 mmol/L Final   Chloride 02/09/2023 100  98 - 111 mmol/L Final   CO2 02/09/2023 28  22 - 32 mmol/L Final   Glucose, Bld 02/09/2023 125 (H)  70 - 99 mg/dL Final   Glucose reference range applies only to samples taken after fasting for at least 8 hours.   BUN 02/09/2023 10  6 - 20 mg/dL Final   Creatinine, Ser 02/09/2023 0.82  0.44 - 1.00 mg/dL Final   Calcium 24/40/1027 9.3  8.9 - 10.3 mg/dL Final   Total Protein 25/36/6440 7.3  6.5 - 8.1 g/dL Final   Albumin 34/74/2595 3.9  3.5 - 5.0 g/dL Final   AST 63/87/5643 20  15 - 41 U/L Final   ALT 02/09/2023 13  0 - 44 U/L Final   Alkaline Phosphatase 02/09/2023 76  38 - 126 U/L Final   Total Bilirubin 02/09/2023 0.3  0.3 - 1.2 mg/dL Final   GFR, Estimated 02/09/2023 >60  >60 mL/min Final   Comment: (NOTE) Calculated using the CKD-EPI Creatinine Equation (2021)    Anion gap 02/09/2023 10  5 - 15 Final   Performed at [facility], [address] [place], [place] [address]   Lipase 02/09/2023 30  11 - 51 U/L Final   Performed at [facility], [address] [place], [place] [address]  Admission on 11/16/2022, Discharged on 11/16/2022  Component Date Value Ref Range Status   Lipase 11/16/2022 <10 (L)  11 -  51 U/L Final   Performed at [facility], [address] [place], [place] [address]   Sodium  11/16/2022 140  135 - 145 mmol/L Final   Potassium 11/16/2022 3.7  3.5 - 5.1 mmol/L Final   Chloride 11/16/2022 103  98 - 111 mmol/L Final   CO2 11/16/2022 27  22 - 32 mmol/L Final   Glucose, Bld 11/16/2022 116 (H)  70 - 99 mg/dL Final   Glucose reference range applies only to samples taken after fasting for at least 8 hours.   BUN 11/16/2022 6  6 - 20 mg/dL Final   Creatinine, Ser 11/16/2022 0.84  0.44 - 1.00 mg/dL Final   Calcium 60/45/4098 9.5  8.9 - 10.3 mg/dL Final   Total Protein 11/91/4782 6.8  6.5 - 8.1 g/dL Final   Albumin 95/62/1308 4.3  3.5 - 5.0 g/dL Final   AST 65/78/4696 19  15 - 41 U/L Final   ALT 11/16/2022 13  0 - 44 U/L Final   Alkaline Phosphatase 11/16/2022 63  38 - 126 U/L Final   Total Bilirubin 11/16/2022 0.4  0.3 - 1.2 mg/dL Final   GFR, Estimated 11/16/2022 >60  >60 mL/min Final   Comment: (NOTE) Calculated using the CKD-EPI Creatinine Equation (2021)    Anion gap 11/16/2022 10  5 - 15 Final   Performed at [facility], [address], [place], [place] [address]   WBC 11/16/2022 9.3  4.0 - 10.5 K/uL Final   RBC 11/16/2022 4.14  3.87 - 5.11 MIL/uL Final   Hemoglobin 11/16/2022 12.5  12.0 - 15.0 g/dL Final   HCT 41/32/4401 38.5  36.0 - 46.0 % Final   MCV 11/16/2022 93.0  80.0 - 100.0 fL Final   MCH 11/16/2022 30.2  26.0 - 34.0 pg Final   MCHC 11/16/2022 32.5  30.0 - 36.0 g/dL Final   RDW 02/72/5366 14.1  11.5 - 15.5 % Final   Platelets 11/16/2022 288  150 - 400 K/uL Final   nRBC 11/16/2022 0.0  0.0 - 0.2 % Final   Performed at [facility], [address] [place], [place] [address]   Color, Urine 11/16/2022 YELLOW  YELLOW Final   APPearance 11/16/2022 CLEAR  CLEAR Final   Specific Gravity, Urine 11/16/2022 1.012  1.005 - 1.030 Final   pH 11/16/2022 7.5  5.0 - 8.0 Final   Glucose, UA 11/16/2022 NEGATIVE  NEGATIVE mg/dL Final   Hgb urine dipstick 11/16/2022 NEGATIVE  NEGATIVE Final   Bilirubin Urine 11/16/2022  NEGATIVE  NEGATIVE Final   Ketones, ur 11/16/2022 NEGATIVE  NEGATIVE mg/dL Final   Protein, ur 74/25/9563 NEGATIVE  NEGATIVE mg/dL Final   Nitrite 87/56/4332 NEGATIVE  NEGATIVE Final   Leukocytes,Ua 11/16/2022 NEGATIVE  NEGATIVE Final   Performed at [facility], [address] [place], [place] [address]   Preg Test, Ur 11/16/2022 NEGATIVE  NEGATIVE Final   Comment:        THE SENSITIVITY OF THIS METHODOLOGY IS >20 mIU/mL. Performed at [facility], [address], [place], [place] [address]    SARS Coronavirus 2 by RT PCR 11/16/2022 NEGATIVE  NEGATIVE Final   Comment: (NOTE) SARS-CoV-2 target nucleic acids are NOT DETECTED.  The SARS-CoV-2 RNA is generally detectable in upper respiratory specimens during the acute phase of infection. The lowest concentration of SARS-CoV-2 viral copies this assay can detect is 138 copies/mL. A negative result does not preclude SARS-Cov-2 infection and should not be used as the sole basis for treatment  or other patient management decisions. A negative result may occur with  improper specimen collection/handling, submission of specimen other than nasopharyngeal swab, presence of viral mutation(s) within the areas targeted by this assay, and inadequate number of viral copies(<138 copies/mL). A negative result must be combined with clinical observations, patient history, and epidemiological information. The expected result is Negative.  Fact Sheet for Patients:  BloggerCourse.com  Fact Sheet for Healthcare Providers:  SeriousBroker.it  This test is no                          t yet approved or cleared by the [place] FDA and  has been authorized for detection and/or diagnosis of SARS-CoV-2 by FDA under an Emergency Use Authorization (EUA). This EUA will remain  in effect (meaning this test can be used) for the duration of the COVID-19 declaration under  Section 564(b)(1) of the Act, 21 U.S.C.section 360bbb-3(b)(1), unless the authorization is terminated  or revoked sooner.       Influenza A by PCR 11/16/2022 NEGATIVE  NEGATIVE Final   Influenza B by PCR 11/16/2022 NEGATIVE  NEGATIVE Final   Comment: (NOTE) The Xpert Xpress SARS-CoV-2/FLU/RSV plus assay is intended as an aid in the diagnosis of influenza from Nasopharyngeal swab specimens and should not be used as a sole basis for treatment. Nasal washings and aspirates are unacceptable for Xpert Xpress SARS-CoV-2/FLU/RSV testing.  Fact Sheet for Patients: BloggerCourse.com  Fact Sheet for Healthcare Providers: SeriousBroker.it  This test is not yet approved or cleared by the [place] FDA and has been authorized for detection and/or diagnosis of SARS-CoV-2 by FDA under an Emergency Use Authorization (EUA). This EUA will remain in effect (meaning this test can be used) for the duration of the COVID-19 declaration under Section 564(b)(1) of the Act, 21 U.S.C. section 360bbb-3(b)(1), unless the authorization is terminated or revoked.     Resp Syncytial Virus by PCR 11/16/2022 NEGATIVE  NEGATIVE Final   Comment: (NOTE) Fact Sheet for Patients: BloggerCourse.com  Fact Sheet for Healthcare Providers: SeriousBroker.it  This test is not yet approved or cleared by the [place] FDA and has been authorized for detection and/or diagnosis of SARS-CoV-2 by FDA under an Emergency Use Authorization (EUA). This EUA will remain in effect (meaning this test can be used) for the duration of the COVID-19 declaration under Section 564(b)(1) of the Act, 21 U.S.C. section 360bbb-3(b)(1), unless the authorization is terminated or revoked.  Performed at [facility], [address], [place], [place] [address]     Allergies: Fish-derived products, Latex, Mucinex  [guaifenesin er], Nitrofurantoin, Reglan [metoclopramide], Shellfish-derived products, Sulfa antibiotics, Sulfamethoxazole-trimethoprim, Bupropion, Citalopram hydrobromide, and Morphine and codeine  Medications:  Facility Ordered Medications  Medication   [COMPLETED] alum & mag hydroxide-simeth (MAALOX/MYLANTA) 200-200-20 MG/5ML suspension 30 mL   And   [COMPLETED] lidocaine (XYLOCAINE) 2 % viscous mouth solution 15 mL   [COMPLETED] hyoscyamine (LEVSIN) tablet 0.25 mg   [COMPLETED] droperidol (INAPSINE) 2.5 MG/ML injection 2.5 mg   acetaminophen (TYLENOL) tablet 650 mg   alum & mag hydroxide-simeth (MAALOX/MYLANTA) 200-200-20 MG/5ML suspension 30 mL   magnesium hydroxide (MILK OF MAGNESIA) suspension 30 mL   traZODone (DESYREL) tablet 50 mg   OLANZapine zydis (ZYPREXA) disintegrating tablet 5 mg   OLANZapine zydis (ZYPREXA) disintegrating tablet 2.5 mg   thiamine (VITAMIN B1) injection 100 mg   multivitamin with minerals tablet 1 tablet   chlordiazePOXIDE (LIBRIUM) capsule 25 mg   loperamide (IMODIUM) capsule 2-4  mg   ondansetron (ZOFRAN-ODT) disintegrating tablet 4 mg   dicyclomine (BENTYL) tablet 20 mg   methocarbamol (ROBAXIN) tablet 500 mg   naproxen (NAPROSYN) tablet 500 mg   PTA Medications  Medication Sig   Probiotic Product (PROBIOTIC ADVANCED PO) Take 1 capsule by mouth 2 (two) times a week.   ondansetron (ZOFRAN-ODT) 8 MG disintegrating tablet Take 1 tablet (8 mg total) by mouth every 8 (eight) hours as needed for nausea or vomiting. (Patient not taking: Reported on 04/13/2023)   omeprazole (PRILOSEC) 40 MG capsule Take 1 capsule (40 mg total) by mouth daily. (Patient not taking: Reported on 04/13/2023)   famotidine (PEPCID) 20 MG tablet Take 1 tablet (20 mg total) by mouth 2 (two) times daily. (Patient not taking: Reported on 04/13/2023)   fluconazole (DIFLUCAN) 150 MG tablet Take 1 tablet (150 mg total) by mouth daily. (Patient not taking: Reported on 04/13/2023)    methocarbamol (ROBAXIN) 500 MG tablet Take 1 tablet (500 mg total) by mouth every 8 (eight) hours as needed for muscle spasms. (Patient not taking: Reported on 04/13/2023)   OLANZapine (ZYPREXA) 5 MG tablet Take 1 tablet (5 mg total) by mouth at bedtime. (Patient not taking: Reported on 04/13/2023)   ondansetron (ZOFRAN-ODT) 4 MG disintegrating tablet Take 1 tablet (4 mg total) by mouth every 8 (eight) hours as needed. (Patient taking differently: Take 4 mg by mouth every 8 (eight) hours as needed for nausea or vomiting.)   dicyclomine (BENTYL) 20 MG tablet Take 1 tablet (20 mg total) by mouth 2 (two) times daily. (Patient taking differently: Take 20 mg by mouth See admin instructions. Take 20 mg by mouth two to three times a day as needed for spasms)   dicyclomine (BENTYL) 10 MG capsule TAKE 2 CAPSULES BY MOUTH 4 TIMES DAILY AS NEEDED FOR SPASMS. (Patient not taking: Reported on 04/13/2023)   gabapentin (NEURONTIN) 300 MG capsule TAKE 1 CAPSULE(300 MG) BY MOUTH THREE TIMES DAILY (Patient taking differently: Take 300 mg by mouth 3 (three) times daily.)    Long Term Goals: Improvement in symptoms so as ready for discharge  Short Term Goals: Patient will verbalize feelings in meetings with treatment team members., Patient will attend at least of 50% of the groups daily., Pt will complete the PHQ9 on admission, day 3 and discharge., and Patient will take medications as prescribed daily.  Medical Decision Making  Patient admitted to the [facility]- facility based crisis unit under IVC for mood stabilization and substance abuse treatment.  Labs Lab Orders         Hemoglobin A1c         Lipid panel     Medication regimen Zyprexa Zydis 2.5 mg p.o. daily for mood stabilization Zyprexa 5 mg p.o. nightly for mood stabilization Gabapentin 100 mg p.o. 3 times daily for off-label cocaine use and anxiety Multivitamin p.o. daily for supplementation  Prn medications  Librium 25 mg every 6 hours for CIWA greater than  10 for benzo withdrawal Robaxin 500 mg every 8 hours as needed for muscle spasms Naproxen 500 mg p.o. twice daily for body aches Vistaril 25 mg p.o. 3 times daily for anxiety Tylenol 650 mg every 6 hours for mouth pain Bentyl 20 mg every 6 hours as needed for abdominal cramping, or spasms  Recommendations  Based on my evaluation the patient does not appear to have an emergency medical condition.  Layla Barter, NP 04/14/23  3:53 PM

## 2023-04-14 NOTE — ED Provider Notes (Signed)
Pt accepted to [facility], EMTALA completed, pt agreeable    Sloan Leiter, DO 04/14/23 1236

## 2023-04-14 NOTE — ED Provider Notes (Signed)
Emergency Medicine Observation Re-evaluation Note  Gina Dorsey is a 32 y.o. female, seen on rounds today.  Pt initially presented to the ED for complaints of Psychiatric Evaluation Currently, the patient is resting in bed.  Physical Exam  BP (!) 166/119 (BP Location: Left Arm)   Pulse 73   Temp 98.6 F (37 C) (Axillary)   Resp 19   Ht 5\' 4"  (1.626 m)   Wt 54.4 kg   LMP 02/20/2023   SpO2 99%   BMI 20.59 kg/m  Physical Exam General: nad Cardiac: extremities well perfused Lungs: no resp distress Abd: not peritoneal  Psych: hyperverbal  ED Course / MDM  EKG:EKG Interpretation  Date/Time:  Thursday Apr 13 2023 13:28:23 EDT Ventricular Rate:  67 PR Interval:  158 QRS Duration: 100 QT Interval:  414 QTC Calculation: 437 R Axis:   88 Text Interpretation: Normal sinus rhythm Normal ECG When compared with ECG of 09-Feb-2023 08:12, PREVIOUS ECG IS PRESENT No significant change was found Confirmed by Drema Pry [phone]) on 04/14/2023 2:32:01 AM  I have reviewed the labs performed to date as well as medications administered while in observation.  Recent changes in the last 24 hours include pt required multiple doses of GI medications overnight. She reports unable to sleep overnight, UDS reviewed, +cocaine +amphetamines. Will give rpt dose of droperidol and start protonix po.   Plan  Current plan is for placement.    Sloan Leiter, DO 04/14/23 [phone]

## 2023-04-14 NOTE — ED Notes (Signed)
Patient was admitted to [facility] from [facility] for detox from opiates, benzo's, cocaine and THC.  Patient reports using heavily and she became aggressive towards family and her father took out IVC.  Patient remains IVC'd at this time.  However she is in agreement with being here and is amenable to treatment.  Patient was anxious but cooperative with admission process.  She has rapid speech and thought and was somewhat tangential.  Patient was oriented to unit and room.  She then went straight into a group that was being held.  She is presently attending a second group and is participating well.  Patient reports an understanding that she "needs Help" due to the amount of substances she consumes.  Will monitor and provide safety and support while on unit.

## 2023-04-14 NOTE — Group Note (Signed)
Group Topic: Change and Accountability  Group Date: 04/14/2023 Start Time: 1330 End Time: 1425 Facilitators: Vonzell Schlatter B  Department: [facility]  Number of Participants: 4  Group Focus: daily focus Treatment Modality:  Psychoeducation Interventions utilized were support Purpose: increase insight  Name: Gina Dorsey Date of Birth: 06/29/1991  MR: [id number]    Level of Participation: minimal Quality of Participation: attentive and cooperative Interactions with others: gave feedback Mood/Affect: positive Triggers (if applicable): na Cognition: coherent/clear Progress: Moderate Response: na Plan: follow-up needed  Patients Problems:  Patient Active Problem List   Diagnosis Date Noted   Polysubstance (including opioids) dependence, daily use (HCC) 04/14/2023   Bipolar I disorder with mania (HCC) 04/13/2023   Bipolar disorder (HCC) 07/24/2022   Diarrhea 01/13/2021   Nausea and vomiting 01/13/2021   Lower abdominal pain 01/13/2021   Depression with suicidal ideation 09/28/2013   Bipolar I disorder, most recent episode (or current) depressed, severe, without mention of psychotic behavior 09/28/2013   Cannabis dependence (HCC) 05/16/2012   Borderline personality disorder (HCC) 05/16/2012   Generalized anxiety disorder 05/16/2012   Panic disorder without agoraphobia 05/16/2012

## 2023-04-14 NOTE — ED Notes (Signed)
Patient was transferred via [employer].

## 2023-04-14 NOTE — ED Notes (Signed)
SPIRITUALITY GROUP NOTE  Spirituality group facilitated by Wilkie Aye, MDiv, B[facility].  Group Description: Group focused on topic of hope. Patients participated in facilitated discussion around topic, connecting with one another around experiences and definitions for hope. Group members engaged with visual explorer photos, reflecting on what hope looks like for them today. Group engaged in discussion around how their definitions of hope are present today in hospital.  Modalities: Psycho-social ed, Adlerian, Narrative, MI  Patient Progress: Gina Dorsey was present throughout group.  Engaged with facilitator and group members. Speech appeared somewhat pressured, but she was easily redirectable and not monopolizing. She identified areas of hope that she wishes to hold while at [facility].
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
## 2023-04-15 DIAGNOSIS — R259 Unspecified abnormal involuntary movements: Secondary | ICD-10-CM | POA: Diagnosis not present

## 2023-04-15 LAB — LIPID PANEL
Cholesterol: 233 mg/dL — ABNORMAL HIGH (ref 0–200)
HDL: 72 mg/dL (ref >40)
LDL Cholesterol: 141 mg/dL — ABNORMAL HIGH (ref 0–99)
Total CHOL/HDL Ratio: 3.2 RATIO
Triglycerides: 102 mg/dL (ref <150)
VLDL: 20 mg/dL (ref 0–40)

## 2023-04-15 LAB — HEMOGLOBIN A1C
Hgb A1c MFr Bld: 5.3 % (ref 4.8–5.6)
Mean Plasma Glucose: 105.41 mg/dL

## 2023-04-15 MED ORDER — CLONIDINE HCL 0.1 MG PO TABS: 0.1000 mg | ORAL_TABLET | Freq: Four times a day (QID) | ORAL | Status: DC

## 2023-04-15 MED ORDER — LORAZEPAM 2 MG/ML IJ SOLN: 0.5000 mg | INTRAMUSCULAR | Status: DC | PRN

## 2023-04-15 MED ORDER — LORAZEPAM 1 MG PO TABS: 1.0000 mg | ORAL_TABLET | ORAL | Status: DC | PRN

## 2023-04-15 MED ORDER — OLANZAPINE 5 MG PO TBDP
ORAL_TABLET | ORAL | Status: AC
  Filled 2023-04-15: qty 1

## 2023-04-15 MED ORDER — CLONIDINE HCL 0.1 MG PO TABS
0.1000 mg | ORAL_TABLET | Freq: Every day | ORAL | Status: DC
Start: 2023-04-20 — End: 2023-04-15

## 2023-04-15 MED ORDER — CLONIDINE HCL 0.1 MG PO TABS: 0.1000 mg | ORAL_TABLET | ORAL | Status: DC

## 2023-04-15 MED ORDER — FLUOXETINE HCL 10 MG PO CAPS
10.0000 mg | ORAL_CAPSULE | Freq: Every day | ORAL | Status: DC
  Administered 2023-04-15 – 2023-04-16 (×2): 10 mg via ORAL
  Filled 2023-04-15 (×2): qty 1

## 2023-04-15 MED ORDER — CHLORDIAZEPOXIDE HCL 25 MG PO CAPS
25.0000 mg | ORAL_CAPSULE | Freq: Four times a day (QID) | ORAL | Status: AC
  Administered 2023-04-15 (×4): 25 mg via ORAL
  Filled 2023-04-15 (×5): qty 1

## 2023-04-15 MED ORDER — CLONIDINE HCL 0.1 MG PO TABS: 0.1000 mg | ORAL_TABLET | Freq: Four times a day (QID) | ORAL | Status: DC | PRN

## 2023-04-15 MED ORDER — CHLORDIAZEPOXIDE HCL 25 MG PO CAPS
25.0000 mg | ORAL_CAPSULE | Freq: Three times a day (TID) | ORAL | Status: DC
  Administered 2023-04-16: 25 mg via ORAL
  Filled 2023-04-15: qty 1

## 2023-04-15 MED ORDER — CHLORDIAZEPOXIDE HCL 25 MG PO CAPS: 25.0000 mg | ORAL_CAPSULE | Freq: Every day | ORAL | Status: DC

## 2023-04-15 MED ORDER — CHLORDIAZEPOXIDE HCL 25 MG PO CAPS: 25.0000 mg | ORAL_CAPSULE | ORAL | Status: DC

## 2023-04-15 MED ORDER — THIAMINE HCL 100 MG/ML IJ SOLN: 100.0000 mg | Freq: Once | INTRAMUSCULAR | Status: DC

## 2023-04-15 MED ORDER — OLANZAPINE 15 MG PO TBDP
7.5000 mg | ORAL_TABLET | Freq: Every day | ORAL | Status: DC
  Administered 2023-04-15: 7.5 mg via ORAL
  Filled 2023-04-15: qty 1

## 2023-04-15 MED ORDER — CLONIDINE HCL 0.1 MG PO TABS
0.1000 mg | ORAL_TABLET | Freq: Four times a day (QID) | ORAL | Status: DC | PRN
  Administered 2023-04-15: 0.1 mg via ORAL
  Filled 2023-04-15: qty 1

## 2023-04-15 NOTE — ED Notes (Signed)
Patient is no longer IVC'd it was rescinded.  Patient signed voluntary.

## 2023-04-15 NOTE — ED Notes (Signed)
Patient sitting in dining room watching television, will continue to monitor patient for safety

## 2023-04-15 NOTE — ED Notes (Signed)
Patient has been awake and alert all day.  She can be needy and intrusive with rapid speech and thought.  Patients IVC was rescinded and since then she has been toying with the idea of discharging herself from the hospital.  Patient will state, "I want to go but I think I should stay one more day."  Patient supported with staying in treatment.  Patients father called and would like to speak to MD.  MD notified.  Will monitor and provide support to patient.  No evidence of withdrawal at this time.

## 2023-04-15 NOTE — ED Notes (Signed)
Patient came to this nurse asking to be discharged. She stated she is no longer experiencing withdrawal symptoms. She also plans on following up with Intensive outpatient therapy as well as Narcotics [facility]. A message has been sent to the provider on duty.

## 2023-04-15 NOTE — ED Notes (Signed)
Patient b/p elevated 170/106 63 retaken several times and remains elevated.  Taken manually and continues to be elevated at 170/96 .  Spoke with NP and since patient just received eve dose of librium B/P to be retaken in one hour if it remains elevated she is to receive 0.1mg  clonidine.  Will endorse.

## 2023-04-15 NOTE — ED Notes (Signed)
Snacks given 

## 2023-04-15 NOTE — ED Notes (Signed)
Patient continues to rest with no sxs of distress noted - will continue to monitor for safety 

## 2023-04-15 NOTE — ED Provider Notes (Cosign Needed Addendum)
Behavioral Health Progress Note  Date and Time: 04/15/2023 11:50 AM Name: Gina Dorsey MRN:  [id number]  Subjective:  Gina Dorsey is a 32 year old female patient with a past psychiatric history significant for bipolar (?), depression, anxiety, panic attacks, polysubstance abuse and past suicide attempts who was transferred from the [facility] emergency department to the [facility] facility based crisis unit today for substance abuse treatment and mood stabilization.   Overnight patient was not noted to have no behavioral issues.   On assessment this AM patient endorses that she feels better mentally and physically. She reports that her stomach has been hurting the last 2 days but it feels somewhat better. Her appetite is still poor, but she has the urge to eat. Patient reports that she was able to sleep well last night. She reports that she now believes that her father was right to get her IVC'd, and she feels she should stay throughout at least the weekend, in case she becomes physically ill again in withdrawal. Patient reports that she also wants to minimize chances of relapse if that is the case. She reports interest in getting outpatient tx and therapy as her next step. Patient denies active SI, HI and AVH on assessment today. She also denies any sense of paranoia or delusions. Patient report that she does continue to have fleeting thoughts of passive SI but reports that she will talk with staff if they become more active.   Patient reports that she was using 50-100mg  of opioids daily (percocet or vicodin) and approx 1-2mg  of Xanax of Klonopin daily. Patient reports that it was harder to gfind the BZDs and last use for both was 5/15, 3 days ago. She reports she has been using cocaine for 4 years and pills for 2 after getting addicted attempting to treat back pain. She reports that she started using THC around 32 yo and this was her introduction to substances.   Patient reports that she  has Borderline Personality d/o, and was dx 8 years ago, but prior to that was believed to have bipolar. Patient endorses that she has never had dysphoria outside of using cocaine, but endorses that she can very easily be impulsive when her mood is low. She reports that when she becomes dysphoric or depressed she will threaten to harm herself or have a SA.    Update: per RN patient's father called asking to speak with provider after patient called and said her IVC was dc'd/ Provider attempted 2x to call father, but father was not available and the phone went to VM. Patient is not attempting to leave or having any behavior concerns this afternoon, after calling her father.    Diagnosis:  Final diagnoses:  Borderline personality disorder (HCC)  Polysubstance abuse (HCC)  Bipolar disorder, current episode depressed, severe, without psychotic features (HCC)    Total Time spent with patient: 30 minutes  Past Psychiatric History: past psychiatric history significant for bipolar (?), Borderline Personality d/o, depression, anxiety, panic attacks, polysubstance abuse and past suicide attempts (requiring hospitalization)  Previous medications: celexa (hives), Prozac ( was on for years it did help and then eventually pooped out), Abilify, Zyprexa (helpful), Zoloft (failed), Cymbalta, lamictal (helpful)  Past Medical History: No seizures, hx of fibromyalgia Family History: none pertinent Family Psychiatric  History: none known Social History: Single. Patient resides alone in an apartment. Patient is unemployed and states that she receives financial support from her father. Patient states that she has an upcoming court date on  June 4 for fraud. No children.    Additional Social History:                         Sleep: Good  Appetite:  Poor  Current Medications:  Current Facility-Administered Medications  Medication Dose Route Frequency Provider Last Rate Last Admin   acetaminophen  (TYLENOL) tablet 650 mg  650 mg Oral Q6H PRN White, Patrice L, NP       alum & mag hydroxide-simeth (MAALOX/MYLANTA) 200-200-20 MG/5ML suspension 30 mL  30 mL Oral Q4H PRN White, Patrice L, NP   30 mL at 04/15/23 0005   chlordiazePOXIDE (LIBRIUM) capsule 25 mg  25 mg Oral QID Eliseo Gum B, MD   25 mg at 04/15/23 1143   Followed by   Melene Muller ON 04/16/2023] chlordiazePOXIDE (LIBRIUM) capsule 25 mg  25 mg Oral TID Bobbye Morton, MD       Followed by   Melene Muller ON 04/17/2023] chlordiazePOXIDE (LIBRIUM) capsule 25 mg  25 mg Oral BH-qamhs Bobbye Morton, MD       Followed by   Melene Muller ON 04/18/2023] chlordiazePOXIDE (LIBRIUM) capsule 25 mg  25 mg Oral Daily Eliseo Gum B, MD       cloNIDine (CATAPRES) tablet 0.1 mg  0.1 mg Oral Q6H PRN Bobbye Morton, MD       dicyclomine (BENTYL) tablet 20 mg  20 mg Oral Q6H PRN White, Patrice L, NP   20 mg at 04/14/23 1943   FLUoxetine (PROZAC) capsule 10 mg  10 mg Oral Daily Eliseo Gum B, MD   10 mg at 04/15/23 1143   gabapentin (NEURONTIN) capsule 100 mg  100 mg Oral TID White, Patrice L, NP   100 mg at 04/15/23 1019   hydrOXYzine (ATARAX) tablet 25 mg  25 mg Oral TID PRN Liborio Nixon L, NP   25 mg at 04/15/23 0005   loperamide (IMODIUM) capsule 2-4 mg  2-4 mg Oral PRN White, Patrice L, NP       LORazepam (ATIVAN) tablet 1 mg  1 mg Oral PRN Bobbye Morton, MD       Or   LORazepam (ATIVAN) injection 0.5 mg  0.5 mg Intramuscular PRN Bobbye Morton, MD       magnesium hydroxide (MILK OF MAGNESIA) suspension 30 mL  30 mL Oral Daily PRN White, Patrice L, NP   30 mL at 04/14/23 1943   methocarbamol (ROBAXIN) tablet 500 mg  500 mg Oral Q8H PRN White, Patrice L, NP   500 mg at 04/14/23 1943   multivitamin with minerals tablet 1 tablet  1 tablet Oral Daily White, Patrice L, NP   1 tablet at 04/15/23 1019   naproxen (NAPROSYN) tablet 500 mg  500 mg Oral BID PRN White, Patrice L, NP       OLANZapine zydis (ZYPREXA) disintegrating tablet 7.5 mg  7.5 mg Oral QHS  Eliseo Gum B, MD       ondansetron (ZOFRAN-ODT) disintegrating tablet 4 mg  4 mg Oral Q6H PRN White, Patrice L, NP   4 mg at 04/14/23 2114   thiamine (VITAMIN B1) injection 100 mg  100 mg Intramuscular Once Eliseo Gum B, MD       traZODone (DESYREL) tablet 50 mg  50 mg Oral QHS PRN White, Patrice L, NP   50 mg at 04/14/23 2111   Current Outpatient Medications  Medication Sig Dispense Refill   dicyclomine (BENTYL) 10 MG capsule TAKE 2  CAPSULES BY MOUTH 4 TIMES DAILY AS NEEDED FOR SPASMS. (Patient not taking: Reported on 04/13/2023) 120 capsule 0   dicyclomine (BENTYL) 20 MG tablet Take 1 tablet (20 mg total) by mouth 2 (two) times daily. (Patient taking differently: Take 20 mg by mouth See admin instructions. Take 20 mg by mouth two to three times a day as needed for spasms) 20 tablet 0   famotidine (PEPCID) 20 MG tablet Take 1 tablet (20 mg total) by mouth 2 (two) times daily. (Patient not taking: Reported on 04/13/2023) 60 tablet 11   fluconazole (DIFLUCAN) 150 MG tablet Take 1 tablet (150 mg total) by mouth daily. (Patient not taking: Reported on 04/13/2023) 1 tablet 1   gabapentin (NEURONTIN) 300 MG capsule TAKE 1 CAPSULE(300 MG) BY MOUTH THREE TIMES DAILY (Patient taking differently: Take 300 mg by mouth 3 (three) times daily.) 180 capsule 0   methocarbamol (ROBAXIN) 500 MG tablet Take 1 tablet (500 mg total) by mouth every 8 (eight) hours as needed for muscle spasms. (Patient not taking: Reported on 04/13/2023) 21 tablet 0   OLANZapine (ZYPREXA) 5 MG tablet Take 1 tablet (5 mg total) by mouth at bedtime. (Patient not taking: Reported on 04/13/2023) 30 tablet 0   omeprazole (PRILOSEC) 40 MG capsule Take 1 capsule (40 mg total) by mouth daily. (Patient not taking: Reported on 04/13/2023) 30 capsule 11   ondansetron (ZOFRAN-ODT) 4 MG disintegrating tablet Take 1 tablet (4 mg total) by mouth every 8 (eight) hours as needed. (Patient taking differently: Take 4 mg by mouth every 8 (eight) hours as  needed for nausea or vomiting.) 20 tablet 0   ondansetron (ZOFRAN-ODT) 8 MG disintegrating tablet Take 1 tablet (8 mg total) by mouth every 8 (eight) hours as needed for nausea or vomiting. (Patient not taking: Reported on 04/13/2023) 12 tablet 0   Probiotic Product (PROBIOTIC ADVANCED PO) Take 1 capsule by mouth 2 (two) times a week.      Labs  Lab Results:  Admission on 04/14/2023  Component Date Value Ref Range Status   Hgb A1c MFr Bld 04/15/2023 5.3  4.8 - 5.6 % Final   Comment: (NOTE) Pre diabetes:          5.7%-6.4%  Diabetes:              >6.4%  Glycemic control for   <7.0% adults with diabetes    Mean Plasma Glucose 04/15/2023 105.41  mg/dL Final   Performed at [facility], [address], [place], [place] [address]   Cholesterol 04/15/2023 233 (H)  0 - 200 mg/dL Final   Triglycerides 62/13/0865 102  <150 mg/dL Final   HDL 78/46/9629 72  >40 mg/dL Final   Total CHOL/HDL Ratio 04/15/2023 3.2  RATIO Final   VLDL 04/15/2023 20  0 - 40 mg/dL Final   LDL Cholesterol 04/15/2023 141 (H)  0 - 99 mg/dL Final   Comment:        Total Cholesterol/HDL:CHD Risk Coronary Heart Disease Risk Table                     Men   Women  1/2 Average Risk   3.4   3.3  Average Risk       5.0   4.4  2 X Average Risk   9.6   7.1  3 X Average Risk  23.4   11.0        Use the calculated Patient Ratio above and the CHD Risk  Table to determine the patient's CHD Risk.        ATP III CLASSIFICATION (LDL):  <100     mg/dL   Optimal  409-811  mg/dL   Near or Above                    Optimal  130-159  mg/dL   Borderline  914-782  mg/dL   High  >956     mg/dL   Very High Performed at [facility], [address], [place], [place] [address]   Admission on 04/13/2023, Discharged on 04/14/2023  Component Date Value Ref Range Status   WBC 04/13/2023 14.2 (H)  4.0 - 10.5 K/uL Final   RBC 04/13/2023 5.09  3.87 - 5.11 MIL/uL Final   Hemoglobin 04/13/2023 14.7  12.0 - 15.0 g/dL Final   HCT  65/78/4696 43.9  36.0 - 46.0 % Final   MCV 04/13/2023 86.2  80.0 - 100.0 fL Final   MCH 04/13/2023 28.9  26.0 - 34.0 pg Final   MCHC 04/13/2023 33.5  30.0 - 36.0 g/dL Final   RDW 29/52/8413 14.2  11.5 - 15.5 % Final   Platelets 04/13/2023 335  150 - 400 K/uL Final   nRBC 04/13/2023 0.0  0.0 - 0.2 % Final   Neutrophils Relative % 04/13/2023 81  % Final   Neutro Abs 04/13/2023 11.4 (H)  1.7 - 7.7 K/uL Final   Lymphocytes Relative 04/13/2023 14  % Final   Lymphs Abs 04/13/2023 2.0  0.7 - 4.0 K/uL Final   Monocytes Relative 04/13/2023 5  % Final   Monocytes Absolute 04/13/2023 0.7  0.1 - 1.0 K/uL Final   Eosinophils Relative 04/13/2023 0  % Final   Eosinophils Absolute 04/13/2023 0.0  0.0 - 0.5 K/uL Final   Basophils Relative 04/13/2023 0  % Final   Basophils Absolute 04/13/2023 0.0  0.0 - 0.1 K/uL Final   Immature Granulocytes 04/13/2023 0  % Final   Abs Immature Granulocytes 04/13/2023 0.04  0.00 - 0.07 K/uL Final   Performed at [facility], [address], [place], [place] [address]   Sodium 04/13/2023 137  135 - 145 mmol/L Final   Potassium 04/13/2023 3.4 (L)  3.5 - 5.1 mmol/L Final   Chloride 04/13/2023 92 (L)  98 - 111 mmol/L Final   CO2 04/13/2023 30  22 - 32 mmol/L Final   Glucose, Bld 04/13/2023 119 (H)  70 - 99 mg/dL Final   Glucose reference range applies only to samples taken after fasting for at least 8 hours.   BUN 04/13/2023 19  6 - 20 mg/dL Final   Creatinine, Ser 04/13/2023 0.91  0.44 - 1.00 mg/dL Final   Calcium 02/72/5366 10.5 (H)  8.9 - 10.3 mg/dL Final   Total Protein 44/01/4741 9.1 (H)  6.5 - 8.1 g/dL Final   Albumin 59/56/3875 5.2 (H)  3.5 - 5.0 g/dL Final   AST 64/33/2951 20  15 - 41 U/L Final   ALT 04/13/2023 15  0 - 44 U/L Final   Alkaline Phosphatase 04/13/2023 81  38 - 126 U/L Final   Total Bilirubin 04/13/2023 0.7  0.3 - 1.2 mg/dL Final   GFR, Estimated 04/13/2023 >60  >60 mL/min Final   Comment: (NOTE) Calculated using the CKD-EPI  Creatinine Equation (2021)    Anion gap 04/13/2023 15  5 - 15 Final   Performed at [facility], [address], [place], [place] [address]   I-stat  hCG, quantitative 04/13/2023 <5.0  <5 mIU/mL Final   Comment 3 04/13/2023          Final   Comment:   GEST. AGE      CONC.  (mIU/mL)   <=1 WEEK        5 - 50     2 WEEKS       50 - 500     3 WEEKS       100 - 10,000     4 WEEKS     1,000 - 30,000        FEMALE AND NON-PREGNANT FEMALE:     LESS THAN 5 mIU/mL    Lipase 04/13/2023 35  11 - 51 U/L Final   Performed at [facility], [address], [place], [place] [address]   Color, Urine 04/13/2023 YELLOW  YELLOW Final   APPearance 04/13/2023 HAZY (A)  CLEAR Final   Specific Gravity, Urine 04/13/2023 1.014  1.005 - 1.030 Final   pH 04/13/2023 8.0  5.0 - 8.0 Final   Glucose, UA 04/13/2023 NEGATIVE  NEGATIVE mg/dL Final   Hgb urine dipstick 04/13/2023 SMALL (A)  NEGATIVE Final   Bilirubin Urine 04/13/2023 NEGATIVE  NEGATIVE Final   Ketones, ur 04/13/2023 5 (A)  NEGATIVE mg/dL Final   Protein, ur 78/29/5621 30 (A)  NEGATIVE mg/dL Final   Nitrite 30/86/5784 NEGATIVE  NEGATIVE Final   Leukocytes,Ua 04/13/2023 NEGATIVE  NEGATIVE Final   RBC / HPF 04/13/2023 0-5  0 - 5 RBC/hpf Final   WBC, UA 04/13/2023 0-5  0 - 5 WBC/hpf Final   Bacteria, UA 04/13/2023 RARE (A)  NONE SEEN Final   Squamous Epithelial / HPF 04/13/2023 11-20  0 - 5 /HPF Final   Performed at [facility], [address], [place], [place] [address]   Alcohol, Ethyl (B) 04/13/2023 <10  <10 mg/dL Final   Comment: (NOTE) Lowest detectable limit for serum alcohol is 10 mg/dL.  For medical purposes only. Performed at [facility], [address] [place], [place] [address]    Salicylate Lvl 04/13/2023 <7.0 (L)  7.0 - 30.0 mg/dL Final   Performed at [facility], [address] [place], [place] [address]   Opiates 04/13/2023 POSITIVE (A)   NONE DETECTED Final   Cocaine 04/13/2023 POSITIVE (A)  NONE DETECTED Final   Benzodiazepines 04/13/2023 POSITIVE (A)  NONE DETECTED Final   Amphetamines 04/13/2023 NONE DETECTED  NONE DETECTED Final   Tetrahydrocannabinol 04/13/2023 POSITIVE (A)  NONE DETECTED Final   Barbiturates 04/13/2023 NONE DETECTED  NONE DETECTED Final   Comment: (NOTE) DRUG SCREEN FOR MEDICAL PURPOSES ONLY.  IF CONFIRMATION IS NEEDED FOR ANY PURPOSE, NOTIFY LAB WITHIN 5 DAYS.  LOWEST DETECTABLE LIMITS FOR URINE DRUG SCREEN Drug Class                     Cutoff (ng/mL) Amphetamine and metabolites    1000 Barbiturate and metabolites    200 Benzodiazepine                 200 Opiates and metabolites        300 Cocaine and metabolites        300 THC                            50 Performed at [facility], [address], [place], [place] [address]   Admission  on 03/14/2023, Discharged on 03/14/2023  Component Date Value Ref Range Status   WBC 03/14/2023 11.9 (H)  4.0 - 10.5 K/uL Final   RBC 03/14/2023 4.55  3.87 - 5.11 MIL/uL Final   Hemoglobin 03/14/2023 13.6  12.0 - 15.0 g/dL Final   HCT 96/02/5408 41.7  36.0 - 46.0 % Final   MCV 03/14/2023 91.6  80.0 - 100.0 fL Final   MCH 03/14/2023 29.9  26.0 - 34.0 pg Final   MCHC 03/14/2023 32.6  30.0 - 36.0 g/dL Final   RDW 81/19/1478 14.3  11.5 - 15.5 % Final   Platelets 03/14/2023 339  150 - 400 K/uL Final   nRBC 03/14/2023 0.0  0.0 - 0.2 % Final   Neutrophils Relative % 03/14/2023 76  % Final   Neutro Abs 03/14/2023 9.0 (H)  1.7 - 7.7 K/uL Final   Lymphocytes Relative 03/14/2023 17  % Final   Lymphs Abs 03/14/2023 2.1  0.7 - 4.0 K/uL Final   Monocytes Relative 03/14/2023 5  % Final   Monocytes Absolute 03/14/2023 0.6  0.1 - 1.0 K/uL Final   Eosinophils Relative 03/14/2023 2  % Final   Eosinophils Absolute 03/14/2023 0.2  0.0 - 0.5 K/uL Final   Basophils Relative 03/14/2023 0  % Final   Basophils Absolute 03/14/2023 0.0  0.0 - 0.1 K/uL  Final   Immature Granulocytes 03/14/2023 0  % Final   Abs Immature Granulocytes 03/14/2023 0.03  0.00 - 0.07 K/uL Final   Performed at [facility], [address] [place], [place] [address]   Sodium 03/14/2023 137  135 - 145 mmol/L Final   Potassium 03/14/2023 4.8  3.5 - 5.1 mmol/L Final   HEMOLYSIS AT THIS LEVEL MAY AFFECT RESULT   Chloride 03/14/2023 101  98 - 111 mmol/L Final   CO2 03/14/2023 25  22 - 32 mmol/L Final   Glucose, Bld 03/14/2023 123 (H)  70 - 99 mg/dL Final   Glucose reference range applies only to samples taken after fasting for at least 8 hours.   BUN 03/14/2023 10  6 - 20 mg/dL Final   Creatinine, Ser 03/14/2023 0.97  0.44 - 1.00 mg/dL Final   Calcium 13/06/6577 9.2  8.9 - 10.3 mg/dL Final   Total Protein 46/96/2952 7.4  6.5 - 8.1 g/dL Final   Albumin 84/13/2440 4.1  3.5 - 5.0 g/dL Final   AST 09/24/2535 23  15 - 41 U/L Final   HEMOLYSIS AT THIS LEVEL MAY AFFECT RESULT   ALT 03/14/2023 11  0 - 44 U/L Final   HEMOLYSIS AT THIS LEVEL MAY AFFECT RESULT   Alkaline Phosphatase 03/14/2023 72  38 - 126 U/L Final   Total Bilirubin 03/14/2023 0.6  0.3 - 1.2 mg/dL Final   HEMOLYSIS AT THIS LEVEL MAY AFFECT RESULT   GFR, Estimated 03/14/2023 >60  >60 mL/min Final   Comment: (NOTE) Calculated using the CKD-EPI Creatinine Equation (2021)    Anion gap 03/14/2023 11  5 - 15 Final   Performed at [facility], [address], [place], [place] [address]   Color, Urine 03/14/2023 STRAW (A)  YELLOW Final   APPearance 03/14/2023 CLEAR  CLEAR Final   Specific Gravity, Urine 03/14/2023 1.004 (L)  1.005 - 1.030 Final   pH 03/14/2023 7.0  5.0 - 8.0 Final   Glucose, UA 03/14/2023 NEGATIVE  NEGATIVE mg/dL Final   Hgb urine dipstick 03/14/2023 NEGATIVE  NEGATIVE Final   Bilirubin Urine 03/14/2023 NEGATIVE  NEGATIVE Final  Ketones, ur 03/14/2023 NEGATIVE  NEGATIVE mg/dL Final   Protein, ur 40/98/1191 NEGATIVE  NEGATIVE mg/dL Final   Nitrite  47/82/9562 NEGATIVE  NEGATIVE Final   Leukocytes,Ua 03/14/2023 NEGATIVE  NEGATIVE Final   Performed at [facility], [address] [place], [place] [address]  Admission on 03/03/2023, Discharged on 03/03/2023  Component Date Value Ref Range Status   Lipase 03/03/2023 46  11 - 51 U/L Final   Performed at [facility], [address], [place], [place] [address]   Sodium 03/03/2023 137  135 - 145 mmol/L Final   Potassium 03/03/2023 3.7  3.5 - 5.1 mmol/L Final   Chloride 03/03/2023 100  98 - 111 mmol/L Final   CO2 03/03/2023 27  22 - 32 mmol/L Final   Glucose, Bld 03/03/2023 175 (H)  70 - 99 mg/dL Final   Glucose reference range applies only to samples taken after fasting for at least 8 hours.   BUN 03/03/2023 16  6 - 20 mg/dL Final   Creatinine, Ser 03/03/2023 0.85  0.44 - 1.00 mg/dL Final   Calcium 96/29/5284 9.9  8.9 - 10.3 mg/dL Final   Total Protein 13/24/4010 7.2  6.5 - 8.1 g/dL Final   Albumin 27/25/3664 4.4  3.5 - 5.0 g/dL Final   AST 40/34/7425 17  15 - 41 U/L Final   ALT 03/03/2023 11  0 - 44 U/L Final   Alkaline Phosphatase 03/03/2023 68  38 - 126 U/L Final   Total Bilirubin 03/03/2023 0.3  0.3 - 1.2 mg/dL Final   GFR, Estimated 03/03/2023 >60  >60 mL/min Final   Comment: (NOTE) Calculated using the CKD-EPI Creatinine Equation (2021)    Anion gap 03/03/2023 10  5 - 15 Final   Performed at [facility], [address], [place], [place] [address]   WBC 03/03/2023 10.7 (H)  4.0 - 10.5 K/uL Final   RBC 03/03/2023 4.16  3.87 - 5.11 MIL/uL Final   Hemoglobin 03/03/2023 12.3  12.0 - 15.0 g/dL Final   HCT 75/64/3329 37.1  36.0 - 46.0 % Final   MCV 03/03/2023 89.2  80.0 - 100.0 fL Final   MCH 03/03/2023 29.6  26.0 - 34.0 pg Final   MCHC 03/03/2023 33.2  30.0 - 36.0 g/dL Final   RDW 51/88/4166 14.4  11.5 - 15.5 % Final   Platelets 03/03/2023 338  150 - 400 K/uL Final   nRBC 03/03/2023 0.0  0.0 - 0.2 % Final   Performed at  [facility], [address] [place], [place] [address]   hCG, Garey Ham Chain, Quant, S 03/03/2023 <1  <5 mIU/mL Final   Comment:          GEST. AGE      CONC.  (mIU/mL)   <=1 WEEK        5 - 50     2 WEEKS       50 - 500     3 WEEKS       100 - 10,000     4 WEEKS     1,000 - 30,000     5 WEEKS     3,500 - 115,000   6-8 WEEKS     12,000 - 270,000    12 WEEKS     15,000 - 220,000        FEMALE AND NON-PREGNANT FEMALE:     LESS THAN 5 mIU/mL Performed at [facility], [address], [place], [place] [address]  Color, Urine 03/03/2023 COLORLESS (A)  YELLOW Final   APPearance 03/03/2023 CLEAR  CLEAR Final   Specific Gravity, Urine 03/03/2023 1.035 (H)  1.005 - 1.030 Final   pH 03/03/2023 7.0  5.0 - 8.0 Final   Glucose, UA 03/03/2023 NEGATIVE  NEGATIVE mg/dL Final   Hgb urine dipstick 03/03/2023 NEGATIVE  NEGATIVE Final   Bilirubin Urine 03/03/2023 NEGATIVE  NEGATIVE Final   Ketones, ur 03/03/2023 NEGATIVE  NEGATIVE mg/dL Final   Protein, ur 81/19/1478 NEGATIVE  NEGATIVE mg/dL Final   Nitrite 29/56/2130 NEGATIVE  NEGATIVE Final   Leukocytes,Ua 03/03/2023 NEGATIVE  NEGATIVE Final   Performed at [facility], [address], [place], [place] [address]   TSH 03/03/2023 0.586  0.350 - 4.500 uIU/mL Final   Comment: Performed by a 3rd Generation assay with a functional sensitivity of <=0.01 uIU/mL. Performed at [facility], [address], [place], [place] [address]   Admission on 02/09/2023, Discharged on 02/09/2023  Component Date Value Ref Range Status   WBC 02/09/2023 14.7 (H)  4.0 - 10.5 K/uL Final   RBC 02/09/2023 4.45  3.87 - 5.11 MIL/uL Final   Hemoglobin 02/09/2023 13.2  12.0 - 15.0 g/dL Final   HCT 95/28/4132 40.8  36.0 - 46.0 % Final   MCV 02/09/2023 91.7  80.0 - 100.0 fL Final   MCH 02/09/2023 29.7  26.0 - 34.0 pg Final   MCHC 02/09/2023 32.4  30.0 - 36.0 g/dL Final   RDW 44/11/270 14.1  11.5 -  15.5 % Final   Platelets 02/09/2023 359  150 - 400 K/uL Final   nRBC 02/09/2023 0.0  0.0 - 0.2 % Final   Neutrophils Relative % 02/09/2023 80  % Final   Neutro Abs 02/09/2023 11.8 (H)  1.7 - 7.7 K/uL Final   Lymphocytes Relative 02/09/2023 13  % Final   Lymphs Abs 02/09/2023 1.9  0.7 - 4.0 K/uL Final   Monocytes Relative 02/09/2023 5  % Final   Monocytes Absolute 02/09/2023 0.7  0.1 - 1.0 K/uL Final   Eosinophils Relative 02/09/2023 2  % Final   Eosinophils Absolute 02/09/2023 0.2  0.0 - 0.5 K/uL Final   Basophils Relative 02/09/2023 0  % Final   Basophils Absolute 02/09/2023 0.0  0.0 - 0.1 K/uL Final   Immature Granulocytes 02/09/2023 0  % Final   Abs Immature Granulocytes 02/09/2023 0.05  0.00 - 0.07 K/uL Final   Performed at [facility], [address] [place], [place] [address]   Sodium 02/09/2023 138  135 - 145 mmol/L Final   Potassium 02/09/2023 4.1  3.5 - 5.1 mmol/L Final   Chloride 02/09/2023 100  98 - 111 mmol/L Final   CO2 02/09/2023 28  22 - 32 mmol/L Final   Glucose, Bld 02/09/2023 125 (H)  70 - 99 mg/dL Final   Glucose reference range applies only to samples taken after fasting for at least 8 hours.   BUN 02/09/2023 10  6 - 20 mg/dL Final   Creatinine, Ser 02/09/2023 0.82  0.44 - 1.00 mg/dL Final   Calcium 40/34/7425 9.3  8.9 - 10.3 mg/dL Final   Total Protein 95/63/8756 7.3  6.5 - 8.1 g/dL Final   Albumin 43/32/9518 3.9  3.5 - 5.0 g/dL Final   AST 84/16/6063 20  15 - 41 U/L Final   ALT 02/09/2023 13  0 - 44 U/L Final   Alkaline Phosphatase 02/09/2023 76  38 - 126 U/L Final   Total Bilirubin 02/09/2023 0.3  0.3 - 1.2 mg/dL Final   GFR, Estimated 02/09/2023 >60  >60 mL/min Final   Comment: (NOTE) Calculated using the CKD-EPI Creatinine Equation (2021)    Anion gap 02/09/2023 10  5 - 15 Final   Performed at [facility], [address] [place], [place] [address]   Lipase 02/09/2023 30  11 - 51 U/L Final   Performed at [facility], [address] [place], [place] [address]  Admission on 11/16/2022, Discharged on 11/16/2022  Component Date Value Ref Range Status   Lipase 11/16/2022 <10 (L)  11 - 51 U/L Final   Performed at [facility], [address], [place], [place] [address]   Sodium 11/16/2022 140  135 - 145 mmol/L Final   Potassium 11/16/2022 3.7  3.5 - 5.1 mmol/L Final   Chloride 11/16/2022 103  98 - 111 mmol/L Final   CO2 11/16/2022 27  22 - 32 mmol/L Final   Glucose, Bld 11/16/2022 116 (H)  70 - 99 mg/dL Final   Glucose reference range applies only to samples taken after fasting for at least 8 hours.   BUN 11/16/2022 6  6 - 20 mg/dL Final   Creatinine, Ser 11/16/2022 0.84  0.44 - 1.00 mg/dL Final   Calcium 91/47/8295 9.5  8.9 - 10.3 mg/dL Final   Total Protein 62/13/0865 6.8  6.5 - 8.1 g/dL Final   Albumin 78/46/9629 4.3  3.5 - 5.0 g/dL Final   AST 52/84/1324 19  15 - 41 U/L Final   ALT 11/16/2022 13  0 - 44 U/L Final   Alkaline Phosphatase 11/16/2022 63  38 - 126 U/L Final   Total Bilirubin 11/16/2022 0.4  0.3 - 1.2 mg/dL Final   GFR, Estimated 11/16/2022 >60  >60 mL/min Final   Comment: (NOTE) Calculated using the CKD-EPI Creatinine Equation (2021)    Anion gap 11/16/2022 10  5 - 15 Final   Performed at [facility], [address] [place], [place] [address]   WBC 11/16/2022 9.3  4.0 - 10.5 K/uL Final   RBC 11/16/2022 4.14  3.87 - 5.11 MIL/uL Final   Hemoglobin 11/16/2022 12.5  12.0 - 15.0 g/dL Final   HCT 72/53/6644 38.5  36.0 - 46.0 % Final   MCV 11/16/2022 93.0  80.0 - 100.0 fL Final   MCH 11/16/2022 30.2  26.0 - 34.0 pg Final   MCHC 11/16/2022 32.5  30.0 - 36.0 g/dL Final   RDW 03/47/4259 14.1  11.5 - 15.5 % Final   Platelets 11/16/2022 288  150 - 400 K/uL Final   nRBC 11/16/2022 0.0  0.0 - 0.2 % Final   Performed at [facility], [address] [place], [place] [address]   Color, Urine 11/16/2022 YELLOW  YELLOW Final   APPearance 11/16/2022  CLEAR  CLEAR Final   Specific Gravity, Urine 11/16/2022 1.012  1.005 - 1.030 Final   pH 11/16/2022 7.5  5.0 - 8.0 Final   Glucose, UA 11/16/2022 NEGATIVE  NEGATIVE mg/dL Final   Hgb urine dipstick 11/16/2022 NEGATIVE  NEGATIVE Final   Bilirubin Urine 11/16/2022 NEGATIVE  NEGATIVE Final   Ketones, ur 11/16/2022 NEGATIVE  NEGATIVE mg/dL Final   Protein, ur 56/43/3295 NEGATIVE  NEGATIVE mg/dL Final   Nitrite 18/84/1660 NEGATIVE  NEGATIVE Final   Leukocytes,Ua 11/16/2022 NEGATIVE  NEGATIVE Final   Performed at [facility], [address] [place], [place] [address]   Preg Test, Ur 11/16/2022 NEGATIVE  NEGATIVE Final   Comment:  THE SENSITIVITY OF THIS METHODOLOGY IS >20 mIU/mL. Performed at [facility], [address] [place], [place] [address]    SARS Coronavirus 2 by RT PCR 11/16/2022 NEGATIVE  NEGATIVE Final   Comment: (NOTE) SARS-CoV-2 target nucleic acids are NOT DETECTED.  The SARS-CoV-2 RNA is generally detectable in upper respiratory specimens during the acute phase of infection. The lowest concentration of SARS-CoV-2 viral copies this assay can detect is 138 copies/mL. A negative result does not preclude SARS-Cov-2 infection and should not be used as the sole basis for treatment or other patient management decisions. A negative result may occur with  improper specimen collection/handling, submission of specimen other than nasopharyngeal swab, presence of viral mutation(s) within the areas targeted by this assay, and inadequate number of viral copies(<138 copies/mL). A negative result must be combined with clinical observations, patient history, and epidemiological information. The expected result is Negative.  Fact Sheet for Patients:  BloggerCourse.com  Fact Sheet for Healthcare Providers:  SeriousBroker.it  This test is no                          t yet approved or cleared  by the [place] FDA and  has been authorized for detection and/or diagnosis of SARS-CoV-2 by FDA under an Emergency Use Authorization (EUA). This EUA will remain  in effect (meaning this test can be used) for the duration of the COVID-19 declaration under Section 564(b)(1) of the Act, 21 U.S.C.section 360bbb-3(b)(1), unless the authorization is terminated  or revoked sooner.       Influenza A by PCR 11/16/2022 NEGATIVE  NEGATIVE Final   Influenza B by PCR 11/16/2022 NEGATIVE  NEGATIVE Final   Comment: (NOTE) The Xpert Xpress SARS-CoV-2/FLU/RSV plus assay is intended as an aid in the diagnosis of influenza from Nasopharyngeal swab specimens and should not be used as a sole basis for treatment. Nasal washings and aspirates are unacceptable for Xpert Xpress SARS-CoV-2/FLU/RSV testing.  Fact Sheet for Patients: BloggerCourse.com  Fact Sheet for Healthcare Providers: SeriousBroker.it  This test is not yet approved or cleared by the [place] FDA and has been authorized for detection and/or diagnosis of SARS-CoV-2 by FDA under an Emergency Use Authorization (EUA). This EUA will remain in effect (meaning this test can be used) for the duration of the COVID-19 declaration under Section 564(b)(1) of the Act, 21 U.S.C. section 360bbb-3(b)(1), unless the authorization is terminated or revoked.     Resp Syncytial Virus by PCR 11/16/2022 NEGATIVE  NEGATIVE Final   Comment: (NOTE) Fact Sheet for Patients: BloggerCourse.com  Fact Sheet for Healthcare Providers: SeriousBroker.it  This test is not yet approved or cleared by the [place] FDA and has been authorized for detection and/or diagnosis of SARS-CoV-2 by FDA under an Emergency Use Authorization (EUA). This EUA will remain in effect (meaning this test can be used) for the duration of the COVID-19 declaration under Section  564(b)(1) of the Act, 21 U.S.C. section 360bbb-3(b)(1), unless the authorization is terminated or revoked.  Performed at [facility], [address] [place], [place] [address]     Blood Alcohol level:  Lab Results  Component Value Date   [facility] <10 04/13/2023   ETH <10 07/23/2022    Metabolic Disorder Labs: Lab Results  Component Value Date   HGBA1C 5.3 04/15/2023   MPG 105.41 04/15/2023   No results found for: "PROLACTIN" Lab Results  Component Value Date   CHOL 233 (H) 04/15/2023   TRIG  102 04/15/2023   HDL 72 04/15/2023   CHOLHDL 3.2 04/15/2023   VLDL 20 04/15/2023   LDLCALC 141 (H) 04/15/2023    Therapeutic Lab Levels: No results found for: "LITHIUM" No results found for: "VALPROATE" No results found for: "CBMZ"  Physical Findings   AUDIT    Flowsheet Row [facility]  Alcohol Use Disorder Identification Test Final Score (AUDIT) 0      PHQ2-9    Flowsheet Row ED from 04/14/2023 in [facility] Office Visit from 04/13/2016 in [facility]  PHQ-2 Total Score 5 0  PHQ-9 Total Score 26 1      Flowsheet Row ED from 04/14/2023 in [facility] ED from 04[facility]13/2023 in [facility] Department at [facility] ED from 03/15/2023 in [facility] Emergency Department at [facility]  C-SSRS RISK CATEGORY No Risk Error: Question 2 not populated No Risk        Musculoskeletal  Strength & Muscle Tone: within normal limits Gait & Station: normal Patient leans: N/A  Psychiatric Specialty Exam  Presentation  General Appearance:  Appropriate for Environment  Eye Contact: Good  Speech: Clear and Coherent  Speech Volume: Normal  Handedness: Right   Mood and Affect  Mood: Dysphoric  Affect: Appropriate   Thought Process  Thought  Processes: Coherent  Descriptions of Associations:Intact  Orientation:Full (Time, Place and Person)  Thought Content:Logical     Hallucinations:Hallucinations: None  Ideas of Reference:None  Suicidal Thoughts:Suicidal Thoughts: No  Homicidal Thoughts:Homicidal Thoughts: No   Sensorium  Memory: Immediate Good; Recent Good  Judgment: Fair  Insight: Fair   Art therapist  Concentration: Fair  Attention Span: Fair  Recall: Good  Fund of Knowledge: Good  Language: Good   Psychomotor Activity  Psychomotor Activity: Psychomotor Activity: Normal   Assets  Assets: Communication Skills; Desire for Improvement; Leisure Time; Resilience   Sleep  Sleep: Sleep: Good   Nutritional Assessment (For OBS and [facility] admissions only) Has the patient had a weight loss or gain of 10 pounds or more in the last 3 months?: Yes Has the patient had a decrease in food intake/or appetite?: Yes Does the patient have dental problems?: No Does the patient have eating habits or behaviors that may be indicators of an eating disorder including binging or inducing vomiting?: No Has the patient recently lost weight without trying?: 2.0 Has the patient been eating poorly because of a decreased appetite?: 1 Malnutrition Screening Tool Score: 3 Nutritional Assessment Referrals: Medication/Tx changes    Physical Exam  Physical Exam Constitutional:      Appearance: Normal appearance.  HENT:     Head: Normocephalic and atraumatic.  Pulmonary:     Effort: Pulmonary effort is normal.  Neurological:     Mental Status: She is alert and oriented to person, place, and time.    Review of Systems  Psychiatric/Behavioral:  Positive for depression. Negative for hallucinations. The patient does not have insomnia.    Blood pressure 132/76, pulse 62, temperature 97.9 F (36.6 C), temperature source Oral, resp. rate 18, SpO2 98 %. There is no height or weight on file to calculate  BMI.  Treatment Plan Summary: Daily contact with patient to assess and evaluate symptoms and progress in treatment and Medication management  Patient appears improved this AM. She endorses feeling benefit from being back on Zyprexa. While patient did not endorse a hx of mania in the  past, her long hx of substance use could cloud this picture, and patient herself has a hx of feeling stable on both lamictal and Zyprexa in her past. Will continue Zyprexa as patient is no longer presenting with bizarre behaviors and is sleeping well, will consolidate to night time as patient does get sedated a bit from AM dose. Will also start Prozac, as patient is willing to give it another try since it helped in the pass. Patient continues to endorse low mood, and reports that this contributes to her substance use and her SA hx in combination with her BPD, leading to increase impulsivity and poor emotional regulation. If patient is truly bipolar, continuing this regimen may also be beneficial. Will place patient on scheduled Librium taper given the frequency of BZD use and she is now 3 days out as well as Clonidine PRN detox for Opioid withdrawal.  IVC will be dc'd patient willing to stay voluntarily.   EKG- Qtc 437 UDS:  + Opiates, cocaine, BZDs, THC CMP: K+ 3.4, Na 137,  CBC: WBC 14.2, ANC 11.4  Opioid use d/o, severe Opioid withdrawal  - Scheduled Opioid detox w/ PRNs  Sedative hypnotic use use d/o, severe- BZD BZD withdrawal - Librium taper scheduled w/ Ativan PRN  PRN -Tylenol 650mg  q6h, pain -Maalox 30ml q4h, indigestion -Atarax 25mg  TID, anxiety -Milk of Mag 30mL, constipation -Trazodone 50mg  QHS, insomnia  - Zofran 4mg  q6h , nausea -Naproxen 500mg  BID PRN, pain - robaxin 500mg  q8h, muscle spams -loperamide 2-4mg , diarrhea - bentyl 20mg  q6h, abdominal cramping   Borderline Personality d/o MDD vs SIMD vs Bipolar d/o, current episode depressed - Start Prozac 10mg  daily - Change Zyprexa to  7.5mg  QHS    PGY-3 Bobbye Morton, MD 04/15/2023 11:50 AM

## 2023-04-15 NOTE — Group Note (Signed)
Group Topic: Emotional Regulation  Group Date: 04/15/2023 Start Time: 1000 End Time: 1024 Facilitators: Levander Campion  Department: [facility]  Number of Participants: 2  Group Focus: anger management Treatment Modality:  Behavior Modification Therapy and Individual Therapy Interventions utilized were patient education and problem solving Purpose: enhance coping skills  Name: Gina Dorsey Date of Birth: 13-Jul-1991  MR: [id number]    Level of Participation: active Quality of Participation: attentive and cooperative Interactions with others: gave feedback Mood/Affect: appropriate Triggers (if applicable): n/a Cognition: coherent/clear Progress: Moderate Response: n/a Plan: follow-up needed  Patients Problems:  Patient Active Problem List   Diagnosis Date Noted  . Polysubstance (including opioids) dependence, daily use (HCC) 04/14/2023  . Substance abuse (HCC) 04/14/2023  . Bipolar I disorder with mania (HCC) 04/13/2023  . Bipolar disorder (HCC) 07/24/2022  . Diarrhea 01/13/2021  . Nausea and vomiting 01/13/2021  . Lower abdominal pain 01/13/2021  . Depression with suicidal ideation 09/28/2013  . Bipolar I disorder, most recent episode (or current) depressed, severe, without mention of psychotic behavior 09/28/2013  . Cannabis dependence (HCC) 05/16/2012  . Borderline personality disorder (HCC) 05/16/2012  . Generalized anxiety disorder 05/16/2012  . Panic disorder without agoraphobia 05/16/2012

## 2023-04-15 NOTE — Group Note (Signed)
Group Topic: Decisional Balance/Substance Abuse  Group Date: 04/15/2023 Start Time: 1430 End Time: 1456 Facilitators: Jenean Lindau, RN  Department: [facility]  Number of Participants: 3  Group Focus: chemical dependency education Treatment Modality:  Behavior Modification Therapy Interventions utilized were patient education and problem solving Purpose: enhance coping skills, explore maladaptive thinking, express feelings, express irrational fears, improve communication skills, and increase insight  Name: Gina Dorsey Date of Birth: 16-Feb-1991  MR: [id number]    Level of Participation: active Quality of Participation: attentive, cooperative, engaged, and hyperactive Interactions with others: gave feedback Mood/Affect: appropriate Triggers (if applicable):   Cognition: concrete and goal directed Progress: Gaining insight Response:   Plan: follow-up needed  Patients Problems:  Patient Active Problem List   Diagnosis Date Noted   Polysubstance (including opioids) dependence, daily use (HCC) 04/14/2023   Substance abuse (HCC) 04/14/2023   Bipolar I disorder with mania (HCC) 04/13/2023   Bipolar disorder (HCC) 07/24/2022   Diarrhea 01/13/2021   Nausea and vomiting 01/13/2021   Lower abdominal pain 01/13/2021   Depression with suicidal ideation 09/28/2013   Bipolar I disorder, most recent episode (or current) depressed, severe, without mention of psychotic behavior 09/28/2013   Cannabis dependence (HCC) 05/16/2012   Borderline personality disorder (HCC) 05/16/2012   Generalized anxiety disorder 05/16/2012   Panic disorder without agoraphobia 05/16/2012

## 2023-04-15 NOTE — ED Notes (Signed)
Patient is awake and alert on unit.  She reports "feeling better"today.  She is calmer, less anxious, less animated and less verbal.  Patient is without withdrawal symptoms at this time.  She is presently watching tv with peer.  No somatic distress or complaint.  Will monitor and provide a safe environment.  She remains IVC's

## 2023-04-16 ENCOUNTER — Encounter (HOSPITAL_COMMUNITY): Payer: Self-pay | Admitting: Behavioral Health

## 2023-04-16 DIAGNOSIS — R259 Unspecified abnormal involuntary movements: Secondary | ICD-10-CM | POA: Diagnosis not present

## 2023-04-16 MED ORDER — CLONIDINE HCL 0.1 MG PO TABS
0.1000 mg | ORAL_TABLET | Freq: Two times a day (BID) | ORAL | Status: DC
  Administered 2023-04-16: 0.1 mg via ORAL
  Filled 2023-04-16: qty 1

## 2023-04-16 MED ORDER — HYDROXYZINE HCL 25 MG PO TABS: 25.0000 mg | ORAL_TABLET | Freq: Three times a day (TID) | ORAL | 0 refills | Status: DC | PRN

## 2023-04-16 MED ORDER — OLANZAPINE 15 MG PO TBDP: 7.5000 mg | ORAL_TABLET | Freq: Every day | ORAL | 0 refills | Status: DC

## 2023-04-16 MED ORDER — CLONIDINE HCL 0.1 MG PO TABS: 0.1000 mg | ORAL_TABLET | Freq: Every day | ORAL | Status: DC

## 2023-04-16 MED ORDER — GABAPENTIN 100 MG PO CAPS: 100.0000 mg | ORAL_CAPSULE | Freq: Three times a day (TID) | ORAL | 0 refills | Status: DC

## 2023-04-16 MED ORDER — FLUOXETINE HCL 10 MG PO CAPS: 10.0000 mg | ORAL_CAPSULE | Freq: Every day | ORAL | 0 refills | Status: DC

## 2023-04-16 MED ORDER — CLONIDINE HCL 0.1 MG PO TABS: 0.1000 mg | ORAL_TABLET | Freq: Two times a day (BID) | ORAL | 0 refills | Status: DC

## 2023-04-16 MED ORDER — TRAZODONE HCL 50 MG PO TABS: 50.0000 mg | ORAL_TABLET | Freq: Every evening | ORAL | 0 refills | Status: DC | PRN

## 2023-04-16 NOTE — ED Notes (Signed)
Patient has spent the morning in the dayroom watching television. Patient denies SI/HI and AVH. Patient has been expressing that she would like to go home. Patient will discuss this with the provider during rounding. Patient has eaten breakfast and received her medications. Patient has been calm and cooperative with staff and the patients that are on the unit. Patient is being monitored for safety.

## 2023-04-16 NOTE — ED Notes (Signed)
Pt is currently sleeping, no distress noted, environmental check complete, will continue to monitor patient for safety.  

## 2023-04-16 NOTE — ED Notes (Signed)
Patient currently standing at the nurses station, no distress noted, will continue to monitor patient for safety

## 2023-04-16 NOTE — ED Notes (Signed)
Patient was discharged to home. Patient denied SI/HI and AVH. Patient reports she will be attending outpatient therapy on tomorrow. Patient was given her AVS with community resources and her medications were called to the [facility].

## 2023-04-16 NOTE — ED Provider Notes (Addendum)
FBC/OBS ASAP Discharge Summary  Date and Time: 04/16/2023 11:42 AM  Name: Gina Dorsey  MRN:  [id number]   Discharge Diagnoses:  Final diagnoses:  Borderline personality disorder (HCC)  Polysubstance abuse (HCC)  Bipolar disorder, current episode depressed, severe, without psychotic features (HCC)    Subjective: MAURIELLE Dorsey is a 32 year old female patient with a past psychiatric history significant for bipolar (?), depression, anxiety, panic attacks, polysubstance abuse and past suicide attempts who was transferred from the [facility] emergency department to the [facility] facility based crisis unit today for substance abuse treatment and mood stabilization.   Stay Summary:   Patient initially presented IVC'd and with bizarre behaviors. Patient was started on Zyprexa and 24 hrs presented less bizarre and near baseline. IVC was rescinded and patient endorsed wish to stay for her health, and interest in IOP.  Patient endorsed feeling that Zyprexa makes her feel more "stable." It was consolidated to QHS dosing to decrease chance of sedation. Patient also endorsed she has been feeling more dysphoric lately and was nervous that this would increase chance of relapse. Patient was started on Prozac as she had had success with this in the past. While on the unit patient interacted well with staff and mileu. She was often seen in the dayroom even when others remained in their room. She was noted to sleep well. While her appetite remained low, she endorsed that this would take time as it had been a chronic issue with her substance use and she had recently bought protein shakes at home to help. She endorsed that although she tried to eat more on the unit, it made her nauseous. Patient denied SI, HI, and AVH. Patient was noted to have some hypertension like due to withdrawal, she was placed back on scheduled clonidine. Patient has found that it also helps her feel more calm. Patient is aware that she  often acts on impulse and has a dx of Borderline Personality d/o and again likes being on medication as she feels that his helps  decrease this.  She endorses improved insight and is appreciated of her father's support and understands why he petitioned for IVC. She will continue to heavily rely on them. Patient endorses wish to go home, and return as a walk-in to be set up for medication mgmt and referral for CDIOP. Patient endorses that she thinks now is a good time for her as her phone with her dealer contacts is broken, and her dad has been reaching out to her real friends. She also has very limited ability to get around and no excess cash.   Provider spoke with patient's father prior to dc and he endorsed that he is paying patient's bills and does not intend to give her spare cash. He also felt that patient being on medication for her mood was beneficial.   Total Time spent with patient: 30 minutes  Past Psychiatric History: past psychiatric history significant for bipolar (?), Borderline Personality d/o, depression, anxiety, panic attacks, polysubstance abuse and past suicide attempts (requiring hospitalization)   Previous medications: celexa (hives), Prozac ( was on for years it did help and then eventually pooped out), Abilify, Zyprexa (helpful), Zoloft (failed), Cymbalta, lamictal (helpful)   Past Medical History: No seizures, hx of fibromyalgia Family History: none pertinent Family Psychiatric  History: none known Social History: Single. Patient resides alone in an apartment. Patient is unemployed and states that she receives financial support from her father. Patient states that she has an upcoming  court date on June 4 for fraud. No children.   Tobacco Cessation:  A prescription for an FDA-approved tobacco cessation medication was offered at discharge and the patient refused  Current Medications:  Current Facility-Administered Medications  Medication Dose Route Frequency Provider Last  Rate Last Admin   acetaminophen (TYLENOL) tablet 650 mg  650 mg Oral Q6H PRN White, Patrice L, NP       alum & mag hydroxide-simeth (MAALOX/MYLANTA) 200-200-20 MG/5ML suspension 30 mL  30 mL Oral Q4H PRN White, Patrice L, NP   30 mL at 04/15/23 0005   chlordiazePOXIDE (LIBRIUM) capsule 25 mg  25 mg Oral TID Eliseo Gum B, MD   25 mg at 04/16/23 1610   Followed by   Melene Muller ON 04/17/2023] chlordiazePOXIDE (LIBRIUM) capsule 25 mg  25 mg Oral BH-qamhs Eliseo Gum B, MD       Followed by   Melene Muller ON 04/18/2023] chlordiazePOXIDE (LIBRIUM) capsule 25 mg  25 mg Oral Daily Eliseo Gum B, MD       cloNIDine (CATAPRES) tablet 0.1 mg  0.1 mg Oral Q6H PRN Bing Neighbors, NP   0.1 mg at 04/15/23 2320   cloNIDine (CATAPRES) tablet 0.1 mg  0.1 mg Oral BID Eliseo Gum B, MD   0.1 mg at 04/16/23 1134   [START ON 04/18/2023] cloNIDine (CATAPRES) tablet 0.1 mg  0.1 mg Oral Daily Bobbye Morton, MD       dicyclomine (BENTYL) tablet 20 mg  20 mg Oral Q6H PRN White, Patrice L, NP   20 mg at 04/15/23 2132   FLUoxetine (PROZAC) capsule 10 mg  10 mg Oral Daily Eliseo Gum B, MD   10 mg at 04/16/23 9604   gabapentin (NEURONTIN) capsule 100 mg  100 mg Oral TID Liborio Nixon L, NP   100 mg at 04/16/23 5409   hydrOXYzine (ATARAX) tablet 25 mg  25 mg Oral TID PRN Liborio Nixon L, NP   25 mg at 04/15/23 2305   loperamide (IMODIUM) capsule 2-4 mg  2-4 mg Oral PRN White, Patrice L, NP       LORazepam (ATIVAN) tablet 1 mg  1 mg Oral PRN Eliseo Gum B, MD       Or   LORazepam (ATIVAN) injection 0.5 mg  0.5 mg Intramuscular PRN Eliseo Gum B, MD       magnesium hydroxide (MILK OF MAGNESIA) suspension 30 mL  30 mL Oral Daily PRN White, Patrice L, NP   30 mL at 04/14/23 1943   methocarbamol (ROBAXIN) tablet 500 mg  500 mg Oral Q8H PRN White, Patrice L, NP   500 mg at 04/15/23 2132   multivitamin with minerals tablet 1 tablet  1 tablet Oral Daily White, Patrice L, NP   1 tablet at 04/16/23 0917   naproxen (NAPROSYN)  tablet 500 mg  500 mg Oral BID PRN White, Patrice L, NP       OLANZapine zydis (ZYPREXA) disintegrating tablet 7.5 mg  7.5 mg Oral QHS Raye Slyter B, MD   7.5 mg at 04/15/23 2119   ondansetron (ZOFRAN-ODT) disintegrating tablet 4 mg  4 mg Oral Q6H PRN White, Patrice L, NP   4 mg at 04/15/23 1622   thiamine (VITAMIN B1) injection 100 mg  100 mg Intramuscular Once Eliseo Gum B, MD       traZODone (DESYREL) tablet 50 mg  50 mg Oral QHS PRN White, Patrice L, NP   50 mg at 04/15/23 2305   Current Outpatient Medications  Medication Sig Dispense Refill   cloNIDine (CATAPRES) 0.1 MG tablet Take 1 tablet (0.1 mg total) by mouth 2 (two) times daily. Take 0.1mg  2x daily for 2 days then take 0.1mg  daily for 2 days. 6 tablet 0   dicyclomine (BENTYL) 20 MG tablet Take 1 tablet (20 mg total) by mouth 2 (two) times daily. (Patient taking differently: Take 20 mg by mouth See admin instructions. Take 20 mg by mouth two to three times a day as needed for spasms) 20 tablet 0   famotidine (PEPCID) 20 MG tablet Take 1 tablet (20 mg total) by mouth 2 (two) times daily. (Patient not taking: Reported on 04/13/2023) 60 tablet 11   fluconazole (DIFLUCAN) 150 MG tablet Take 1 tablet (150 mg total) by mouth daily. (Patient not taking: Reported on 04/13/2023) 1 tablet 1   [START ON 04/17/2023] FLUoxetine (PROZAC) 10 MG capsule Take 1 capsule (10 mg total) by mouth daily. 30 capsule 0   gabapentin (NEURONTIN) 100 MG capsule Take 1 capsule (100 mg total) by mouth 3 (three) times daily. 90 capsule 0   hydrOXYzine (ATARAX) 25 MG tablet Take 1 tablet (25 mg total) by mouth 3 (three) times daily as needed for anxiety. 30 tablet 0   methocarbamol (ROBAXIN) 500 MG tablet Take 1 tablet (500 mg total) by mouth every 8 (eight) hours as needed for muscle spasms. (Patient not taking: Reported on 04/13/2023) 21 tablet 0   OLANZapine zydis (ZYPREXA) 15 MG disintegrating tablet Take 0.5 tablets (7.5 mg total) by mouth at bedtime. 30 tablet 0    omeprazole (PRILOSEC) 40 MG capsule Take 1 capsule (40 mg total) by mouth daily. (Patient not taking: Reported on 04/13/2023) 30 capsule 11   ondansetron (ZOFRAN-ODT) 4 MG disintegrating tablet Take 1 tablet (4 mg total) by mouth every 8 (eight) hours as needed. (Patient taking differently: Take 4 mg by mouth every 8 (eight) hours as needed for nausea or vomiting.) 20 tablet 0   ondansetron (ZOFRAN-ODT) 8 MG disintegrating tablet Take 1 tablet (8 mg total) by mouth every 8 (eight) hours as needed for nausea or vomiting. (Patient not taking: Reported on 04/13/2023) 12 tablet 0   Probiotic Product (PROBIOTIC ADVANCED PO) Take 1 capsule by mouth 2 (two) times a week.     traZODone (DESYREL) 50 MG tablet Take 1 tablet (50 mg total) by mouth at bedtime as needed for sleep. 30 tablet 0    PTA Medications:  Facility Ordered Medications  Medication   [COMPLETED] alum & mag hydroxide-simeth (MAALOX/MYLANTA) 200-200-20 MG/5ML suspension 30 mL   And   [COMPLETED] lidocaine (XYLOCAINE) 2 % viscous mouth solution 15 mL   [COMPLETED] hyoscyamine (LEVSIN) tablet 0.25 mg   [COMPLETED] droperidol (INAPSINE) 2.5 MG/ML injection 2.5 mg   acetaminophen (TYLENOL) tablet 650 mg   alum & mag hydroxide-simeth (MAALOX/MYLANTA) 200-200-20 MG/5ML suspension 30 mL   magnesium hydroxide (MILK OF MAGNESIA) suspension 30 mL   traZODone (DESYREL) tablet 50 mg   multivitamin with minerals tablet 1 tablet   loperamide (IMODIUM) capsule 2-4 mg   ondansetron (ZOFRAN-ODT) disintegrating tablet 4 mg   dicyclomine (BENTYL) tablet 20 mg   methocarbamol (ROBAXIN) tablet 500 mg   naproxen (NAPROSYN) tablet 500 mg   hydrOXYzine (ATARAX) tablet 25 mg   gabapentin (NEURONTIN) capsule 100 mg   thiamine (VITAMIN B1) injection 100 mg   [COMPLETED] chlordiazePOXIDE (LIBRIUM) capsule 25 mg   Followed by   chlordiazePOXIDE (LIBRIUM) capsule 25 mg   Followed by   Melene Muller ON 04/17/2023]  chlordiazePOXIDE (LIBRIUM) capsule 25 mg   Followed  by   Melene Muller ON 04/18/2023] chlordiazePOXIDE (LIBRIUM) capsule 25 mg   LORazepam (ATIVAN) tablet 1 mg   Or   LORazepam (ATIVAN) injection 0.5 mg   OLANZapine zydis (ZYPREXA) disintegrating tablet 7.5 mg   FLUoxetine (PROZAC) capsule 10 mg   cloNIDine (CATAPRES) tablet 0.1 mg   cloNIDine (CATAPRES) tablet 0.1 mg   [START ON 04/18/2023] cloNIDine (CATAPRES) tablet 0.1 mg   PTA Medications  Medication Sig   Probiotic Product (PROBIOTIC ADVANCED PO) Take 1 capsule by mouth 2 (two) times a week.   ondansetron (ZOFRAN-ODT) 8 MG disintegrating tablet Take 1 tablet (8 mg total) by mouth every 8 (eight) hours as needed for nausea or vomiting. (Patient not taking: Reported on 04/13/2023)   omeprazole (PRILOSEC) 40 MG capsule Take 1 capsule (40 mg total) by mouth daily. (Patient not taking: Reported on 04/13/2023)   famotidine (PEPCID) 20 MG tablet Take 1 tablet (20 mg total) by mouth 2 (two) times daily. (Patient not taking: Reported on 04/13/2023)   fluconazole (DIFLUCAN) 150 MG tablet Take 1 tablet (150 mg total) by mouth daily. (Patient not taking: Reported on 04/13/2023)   methocarbamol (ROBAXIN) 500 MG tablet Take 1 tablet (500 mg total) by mouth every 8 (eight) hours as needed for muscle spasms. (Patient not taking: Reported on 04/13/2023)   ondansetron (ZOFRAN-ODT) 4 MG disintegrating tablet Take 1 tablet (4 mg total) by mouth every 8 (eight) hours as needed. (Patient taking differently: Take 4 mg by mouth every 8 (eight) hours as needed for nausea or vomiting.)   dicyclomine (BENTYL) 20 MG tablet Take 1 tablet (20 mg total) by mouth 2 (two) times daily. (Patient taking differently: Take 20 mg by mouth See admin instructions. Take 20 mg by mouth two to three times a day as needed for spasms)   cloNIDine (CATAPRES) 0.1 MG tablet Take 1 tablet (0.1 mg total) by mouth 2 (two) times daily. Take 0.1mg  2x daily for 2 days then take 0.1mg  daily for 2 days.   [START ON 04/17/2023] FLUoxetine (PROZAC) 10 MG capsule  Take 1 capsule (10 mg total) by mouth daily.   hydrOXYzine (ATARAX) 25 MG tablet Take 1 tablet (25 mg total) by mouth 3 (three) times daily as needed for anxiety.   OLANZapine zydis (ZYPREXA) 15 MG disintegrating tablet Take 0.5 tablets (7.5 mg total) by mouth at bedtime.   traZODone (DESYREL) 50 MG tablet Take 1 tablet (50 mg total) by mouth at bedtime as needed for sleep.   gabapentin (NEURONTIN) 100 MG capsule Take 1 capsule (100 mg total) by mouth 3 (three) times daily.       04/14/2023    3:10 PM 04/13/2016   10:00 AM  Depression screen PHQ 2/9  Decreased Interest 3 0  Down, Depressed, Hopeless 2 0  PHQ - 2 Score 5 0  Altered sleeping 3 0  Tired, decreased energy 3 1  Change in appetite 3 0  Feeling bad or failure about yourself  3 0  Trouble concentrating 3 0  Moving slowly or fidgety/restless 3 0  Suicidal thoughts 3 0  PHQ-9 Score 26 1  Difficult doing work/chores Very difficult     Flowsheet Row ED from 04/14/2023 in [facility] ED from 04/13/2023 in [facility] Emergency Department at [facility] ED from 03/15/2023 in [facility] Emergency Department at [facility]  C-SSRS RISK CATEGORY No Risk Error: Question 2 not populated No Risk  Musculoskeletal  Strength & Muscle Tone: within normal limits Gait & Station: normal Patient leans: N/A  Psychiatric Specialty Exam  Presentation  General Appearance:  Appropriate for Environment  Eye Contact: Good  Speech: Clear and Coherent; Pressured  Speech Volume: Normal  Handedness: Right   Mood and Affect  Mood: Euthymic  Affect: Appropriate   Thought Process  Thought Processes: Coherent  Descriptions of Associations:Intact  Orientation:Full (Time, Place and Person)  Thought Content:Logical     Hallucinations:Hallucinations: None  Ideas of Reference:None  Suicidal Thoughts:Suicidal Thoughts: No  Homicidal Thoughts:Homicidal Thoughts:  No   Sensorium  Memory: Immediate Good; Recent Good  Judgment: Good  Insight: Fair   Art therapist  Concentration: Good  Attention Span: Good  Recall: Good  Fund of Knowledge: Good  Language: Good   Psychomotor Activity  Psychomotor Activity: Psychomotor Activity: Normal   Assets  Assets: Desire for Improvement; Communication Skills; Resilience; Social Support   Sleep  Sleep: Sleep: Good   No data recorded  Physical Exam  Physical Exam HENT:     Head: Normocephalic and atraumatic.  Pulmonary:     Effort: Pulmonary effort is normal.  Neurological:     Mental Status: She is alert and oriented to person, place, and time.    Review of Systems  Psychiatric/Behavioral:  Negative for depression, hallucinations and suicidal ideas. The patient does not have insomnia.    Blood pressure (!) 143/97, pulse 63, temperature 98.4 F (36.9 C), temperature source Oral, resp. rate 18, SpO2 100 %. There is no height or weight on file to calculate BMI.  Demographic Factors:  Caucasian and Living alone  Loss Factors: Legal issues  Historical Factors: Impulsivity  Risk Reduction Factors:   Sense of responsibility to family  Continued Clinical Symptoms:  Previous Psychiatric Diagnoses and Treatments  Cognitive Features That Contribute To Risk:  None    Suicide Risk:  Mild:  Suicidal ideation of limited frequency, intensity, duration, and specificity.  There are no identifiable plans, no associated intent, mild dysphoria and related symptoms, good self-control (both objective and subjective assessment), few other risk factors, and identifiable protective factors, including available and accessible social support. Patient's hx of impulsivity suggest mild vs minimal risk.   Plan Of Care/Follow-up recommendations:  Follow up recommendations: - Activity as tolerated. - Diet as recommended by PCP. - Keep all scheduled follow-up appointments as  recommended.   Disposition:   Patient has been dx with BPD, her mood instability (self reported and by father) and positive response to medications led to continued Zyprexa use. Will not remove patient's Bipolar dx, although patient endorses that this has been changed to BPD in the past.  Stimulant abuse- cocaine Opioid use d/o, severe Opioid withdrawal  -Clonidine 0.1mg  BID x 2 days then 0.1mg  daily x 2 days   - F/U with walk-in clinic for referral to CDIOP  Sedative hypnotic use use d/o, severe- BZD   - F/U with walk-in clinic for referral to CDIOP  PRN -Tylenol 650mg  q6h, pain -Maalox 30ml q4h, indigestion -Atarax 25mg  TID, anxiety -Milk of Mag 30mL, constipation -Trazodone 50mg  QHS, insomnia  - Zofran 4mg  q6h , nausea -Naproxen 500mg  BID PRN, pain - robaxin 500mg  q8h, muscle spams -loperamide 2-4mg , diarrhea - bentyl 20mg  q6h, abdominal cramping    Borderline Personality d/o MDD vs SIMD vs Bipolar d/o, current episode depressed - Continue Prozac 10mg  daily - Continue Zyprexa to 7.5mg  QHS   PGY-3 Bobbye Morton, MD 04/16/2023, 11:42 AM

## 2023-04-16 NOTE — Discharge Instructions (Signed)
Walk-In Clinic Hours   Walk In Hours are available for Medication Management and Therapy on the following days and times:   Monday  Medication Management - 7:30 am - 11 am  Therapy - 7:30 am - 4 pm  Tuesday   Medication Management - 7:30 am - 11 am  Therapy - 7:30 am - 4 pm  Wednesday   Medication Management - 7:30 am - 11 am  Therapy - 7:30 am - 4 pm  Thursday  Medication Management - 7:30 am - 11 am  Therapy - 7:30 am - 4 pm  Friday   Medication Management - 7:30 am - 11 am  Therapy - 12 pm - 4 pm  * Please note that limited slots are available, on any given day, for both services and appointments are distributed on a first come, first serve bases.   ** Walk-Ins/Scheduled Appointments must be [employer] and Uninsured or have Medicaid Coverage.

## 2023-04-18 ENCOUNTER — Telehealth (HOSPITAL_COMMUNITY): Payer: Self-pay

## 2023-04-19 ENCOUNTER — Telehealth (HOSPITAL_COMMUNITY): Payer: Self-pay

## 2023-04-20 ENCOUNTER — Telehealth (HOSPITAL_COMMUNITY): Payer: Self-pay | Admitting: *Deleted

## 2023-04-20 ENCOUNTER — Ambulatory Visit (INDEPENDENT_AMBULATORY_CARE_PROVIDER_SITE_OTHER): Payer: No Typology Code available for payment source | Admitting: Psychiatry

## 2023-04-20 ENCOUNTER — Encounter (HOSPITAL_COMMUNITY): Payer: Self-pay | Admitting: Psychiatry

## 2023-04-20 VITALS — BP 117/76 | HR 86 | Temp 98.7°F

## 2023-04-20 DIAGNOSIS — F1910 Other psychoactive substance abuse, uncomplicated: Secondary | ICD-10-CM | POA: Diagnosis not present

## 2023-04-20 DIAGNOSIS — F411 Generalized anxiety disorder: Secondary | ICD-10-CM

## 2023-04-20 DIAGNOSIS — F4310 Post-traumatic stress disorder, unspecified: Secondary | ICD-10-CM

## 2023-04-20 DIAGNOSIS — F17210 Nicotine dependence, cigarettes, uncomplicated: Secondary | ICD-10-CM

## 2023-04-20 DIAGNOSIS — F603 Borderline personality disorder: Secondary | ICD-10-CM

## 2023-04-20 DIAGNOSIS — F17200 Nicotine dependence, unspecified, uncomplicated: Secondary | ICD-10-CM

## 2023-04-20 MED ORDER — GABAPENTIN 100 MG PO CAPS: 100.0000 mg | ORAL_CAPSULE | Freq: Three times a day (TID) | ORAL | 3 refills | Status: DC

## 2023-04-20 MED ORDER — FLUOXETINE HCL 10 MG PO CAPS: 10.0000 mg | ORAL_CAPSULE | Freq: Every day | ORAL | 3 refills | Status: DC

## 2023-04-20 MED ORDER — NICOTINE 21 MG/24HR TD PT24
21.0000 mg | MEDICATED_PATCH | Freq: Every day | TRANSDERMAL | 3 refills | Status: DC
Start: 2023-04-20 — End: 2023-05-10

## 2023-04-20 MED ORDER — TRAZODONE HCL 50 MG PO TABS
50.0000 mg | ORAL_TABLET | Freq: Every evening | ORAL | 3 refills | Status: DC | PRN
Start: 2023-04-20 — End: 2023-07-24

## 2023-04-20 MED ORDER — OLANZAPINE 15 MG PO TBDP
7.5000 mg | ORAL_TABLET | Freq: Every day | ORAL | 3 refills | Status: DC
Start: 2023-04-20 — End: 2023-07-24

## 2023-04-20 MED ORDER — HYDROXYZINE HCL 25 MG PO TABS
25.0000 mg | ORAL_TABLET | Freq: Three times a day (TID) | ORAL | 3 refills | Status: DC | PRN
Start: 2023-04-20 — End: 2023-07-24
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
## 2023-04-20 NOTE — Telephone Encounter (Signed)
Fax from [facility] for patients Clonidine. Per Gerlean Ren, MDs note appears Clonidine being tapered down and only given six days of meds. She has a future appt later this month. Will forward this to [place], but it looks like it is not appropriate to get a new rx.

## 2023-04-20 NOTE — Telephone Encounter (Signed)
Correct No refill, this is an [facility] patient as well. Thanks

## 2023-05-10 ENCOUNTER — Encounter: Payer: Self-pay | Admitting: Gastroenterology

## 2023-05-10 ENCOUNTER — Ambulatory Visit: Payer: No Typology Code available for payment source | Admitting: Gastroenterology

## 2023-05-10 VITALS — BP 120/60 | HR 74 | Ht 64.0 in | Wt 134.0 lb

## 2023-05-10 DIAGNOSIS — K581 Irritable bowel syndrome with constipation: Secondary | ICD-10-CM | POA: Diagnosis not present

## 2023-05-10 DIAGNOSIS — K2100 Gastro-esophageal reflux disease with esophagitis, without bleeding: Secondary | ICD-10-CM | POA: Diagnosis not present

## 2023-05-10 MED ORDER — DICYCLOMINE HCL 20 MG PO TABS: 20.0000 mg | ORAL_TABLET | Freq: Two times a day (BID) | ORAL | 11 refills | Status: AC

## 2023-05-10 MED ORDER — ONDANSETRON 8 MG PO TBDP: 8.0000 mg | ORAL_TABLET | Freq: Two times a day (BID) | ORAL | 3 refills | Status: DC | PRN

## 2023-05-10 NOTE — Progress Notes (Signed)
Assessment     IBS-C GERD with LA Class A esophagitis   Recommendations    Resume dicyclomine 20 mg po bid prn abdominal pain  Continue fiber supplement daily  Resume Zofran 8 mg po bid prn N/V Omeprazole 20 mg po bid or famotidine 20 mg po bid and follow antireflux measures REV in 1 year   HPI    This is a 32 year old female former patient of Dr. Orvan Falconer with GERD, IBS-C complaining of lower abdominal pain, nausea and mild constipation. GERD symptoms controlled on OTC omeprazole or famotidine. Has run out of dicyclomine and ondansetron. Recent CT AP was unremarkable. Colonoscopy and EGD in April 2022 were unremarkable.    Labs / Imaging       Latest Ref Rng & Units 04/13/2023   12:19 PM 03/14/2023    7:31 AM 03/03/2023    7:02 PM  Hepatic Function  Total Protein 6.5 - 8.1 g/dL 9.1  7.4  7.2   Albumin 3.5 - 5.0 g/dL 5.2  4.1  4.4   AST 15 - 41 U/L 20  23  17    ALT 0 - 44 U/L 15  11  11    Alk Phosphatase 38 - 126 U/L 81  72  68   Total Bilirubin 0.3 - 1.2 mg/dL 0.7  0.6  0.3        Latest Ref Rng & Units 04/13/2023   12:19 PM 03/14/2023    7:31 AM 03/03/2023    7:02 PM  CBC  WBC 4.0 - 10.5 K/uL 14.2  11.9  10.7   Hemoglobin 12.0 - 15.0 g/dL 40.9  81.1  91.4   Hematocrit 36.0 - 46.0 % 43.9  41.7  37.1   Platelets 150 - 400 K/uL 335  339  338      CT ABDOMEN PELVIS W CONTRAST CLINICAL DATA:  Mild splenomegaly, hematemesis, upper abdominal pain.  EXAM: CT ABDOMEN AND PELVIS WITH CONTRAST  TECHNIQUE: Multidetector CT imaging of the abdomen and pelvis was performed using the standard protocol following bolus administration of intravenous contrast.  RADIATION DOSE REDUCTION: This exam was performed according to the departmental dose-optimization program which includes automated exposure control, adjustment of the mA and/or kV according to patient size and/or use of iterative reconstruction technique.  CONTRAST:  ISOVUE-300 IOPAMIDOL (ISOVUE-300)  INJECTION 61%  COMPARISON:  CT abdomen and pelvis 04/06/2021  FINDINGS: Lower chest: No acute abnormality.  Hepatobiliary: No focal liver abnormality is seen. No gallstones, gallbladder wall thickening, or biliary dilatation.  Pancreas: Unremarkable.  Spleen: The spleen is normal in size measuring 10.8 cm in craniocaudal dimension. This is unchanged from 04/06/2021.  Adrenals/Urinary Tract: Normal adrenal glands. No urinary calculi or hydronephrosis. Unremarkable bladder.  Stomach/Bowel: Normal caliber large and small bowel. No bowel wall thickening. The appendix is not definitively visualized. No secondary signs of appendicitis. Stomach is within normal limits.  Vascular/Lymphatic: No significant vascular findings are present. No enlarged abdominal or pelvic lymph nodes.  Reproductive: Uterus and bilateral adnexa are unremarkable.  Other: Trace free fluid in the pelvis is likely physiologic. No free intraperitoneal air.  Musculoskeletal: No acute osseous abnormality.  IMPRESSION: 1. No acute abnormality in the abdomen or pelvis. 2. Normal size spleen, unchanged from 04/06/2021.  Electronically Signed   By: Minerva Fester M.D.   On: 03/03/2023 20:41   Current Medications, Allergies, Past Medical History, Past Surgical History, Family History and Social History were reviewed in [employer] record.  Physical Exam: General: Well developed, well nourished, no acute distress Head: Normocephalic and atraumatic Eyes: Sclerae anicteric, EOMI Ears: Normal auditory acuity Mouth: No deformities or lesions noted Lungs: Clear throughout to auscultation Heart: Regular rate and rhythm; No rubs or bruits; 2/6 systolic murmur Abdomen: Soft, non tender and non distended. No masses, hepatosplenomegaly or hernias noted. Normal Bowel sounds Rectal: Not done Musculoskeletal: Symmetrical with no gross deformities  Pulses:  Normal pulses noted Extremities: No  edema or deformities noted Neurological: Alert oriented x 4, grossly nonfocal Psychological:  Alert and cooperative. Normal mood and affect   Tinslee Klare T. Russella Dar, MD 05/10/2023, 2:14 PM

## 2023-05-10 NOTE — Patient Instructions (Signed)
If your blood pressure at your visit was 140/90 or greater, please contact your primary care physician to follow up on this. ______________________________________________________  If you are age 33 or older, your body mass index should be between 23-30. Your Body mass index is 23 kg/m. If this is out of the aforementioned range listed, please consider follow up with your Primary Care Provider.  If you are age 3 or younger, your body mass index should be between 19-25. Your Body mass index is 23 kg/m. If this is out of the aformentioned range listed, please consider follow up with your Primary Care Provider.  ________________________________________________________  The [place] [facility] providers would like to encourage you to use [facility] to communicate with providers for non-urgent requests or questions.  Due to long hold times on the telephone, sending your provider a message by [facility] may be a faster and more efficient way to get a response.  Please allow 48 business hours for a response.  Please remember that this is for non-urgent requests.  _______________________________________________________  Due to recent changes in healthcare laws, you may see the results of your imaging and laboratory studies on MyChart before your provider has had a chance to review them.  We understand that in some cases there may be results that are confusing or concerning to you. Not all laboratory results come back in the same time frame and the provider may be waiting for multiple results in order to interpret others.  Please give [place] 48 hours in order for your provider to thoroughly review all the results before contacting the office for clarification of your results.   We have sent the following medications to your pharmacy for you to pick up at your convenience: Zofran Bentyl  We are giving you anti reflux measures handout today.  Thank you for choosing me and [facility].  Venita Lick. Pleas Koch.,  MD., Clementeen Graham

## 2023-05-22 ENCOUNTER — Encounter (HOSPITAL_BASED_OUTPATIENT_CLINIC_OR_DEPARTMENT_OTHER): Payer: Self-pay | Admitting: Emergency Medicine

## 2023-05-22 ENCOUNTER — Other Ambulatory Visit: Payer: Self-pay

## 2023-05-22 ENCOUNTER — Emergency Department (HOSPITAL_BASED_OUTPATIENT_CLINIC_OR_DEPARTMENT_OTHER)
Admission: EM | Admit: 2023-05-22 | Discharge: 2023-05-22 | Disposition: A | Discharge status: Home / Self Care | Payer: No Typology Code available for payment source | Attending: Emergency Medicine | Admitting: Emergency Medicine

## 2023-05-22 DIAGNOSIS — R112 Nausea with vomiting, unspecified: Secondary | ICD-10-CM | POA: Insufficient documentation

## 2023-05-22 LAB — URINALYSIS, ROUTINE W REFLEX MICROSCOPIC
Bacteria, UA: NONE SEEN
Bilirubin Urine: NEGATIVE
Glucose, UA: NEGATIVE mg/dL
Ketones, ur: NEGATIVE mg/dL
Leukocytes,Ua: NEGATIVE
Nitrite: NEGATIVE
Protein, ur: 30 mg/dL — AB
Specific Gravity, Urine: 1.031 — ABNORMAL HIGH (ref 1.005–1.030)
pH: 7 (ref 5.0–8.0)

## 2023-05-22 LAB — COMPREHENSIVE METABOLIC PANEL
ALT: 9 U/L (ref 0–44)
AST: 15 U/L (ref 15–41)
Albumin: 4.8 g/dL (ref 3.5–5.0)
Alkaline Phosphatase: 81 U/L (ref 38–126)
Anion gap: 10 (ref 5–15)
BUN: 22 mg/dL — ABNORMAL HIGH (ref 6–20)
CO2: 29 mmol/L (ref 22–32)
Calcium: 10.1 mg/dL (ref 8.9–10.3)
Chloride: 98 mmol/L (ref 98–111)
Creatinine, Ser: 0.97 mg/dL (ref 0.44–1.00)
GFR, Estimated: >60 mL/min (ref >60)
Glucose, Bld: 147 mg/dL — ABNORMAL HIGH (ref 70–99)
Potassium: 3.5 mmol/L (ref 3.5–5.1)
Sodium: 137 mmol/L (ref 135–145)
Total Bilirubin: 0.4 mg/dL (ref 0.3–1.2)
Total Protein: 7.9 g/dL (ref 6.5–8.1)

## 2023-05-22 LAB — LIPASE, BLOOD: Lipase: <10 U/L — ABNORMAL LOW (ref 11–51)

## 2023-05-22 LAB — CBC
HCT: 42.9 % (ref 36.0–46.0)
Hemoglobin: 14.4 g/dL (ref 12.0–15.0)
MCH: 29.3 pg (ref 26.0–34.0)
MCHC: 33.6 g/dL (ref 30.0–36.0)
MCV: 87.2 fL (ref 80.0–100.0)
Platelets: 361 10*3/uL (ref 150–400)
RBC: 4.92 MIL/uL (ref 3.87–5.11)
RDW: 14.8 % (ref 11.5–15.5)
WBC: 11.5 10*3/uL — ABNORMAL HIGH (ref 4.0–10.5)
nRBC: 0 % (ref 0.0–0.2)

## 2023-05-22 LAB — PREGNANCY, URINE: Preg Test, Ur: NEGATIVE

## 2023-05-22 MED ORDER — SODIUM CHLORIDE 0.9 % IV BOLUS
1000.0000 mL | Freq: Once | INTRAVENOUS | Status: AC
  Administered 2023-05-22: 1000 mL via INTRAVENOUS

## 2023-05-22 MED ORDER — ONDANSETRON HCL 4 MG/2ML IJ SOLN
4.0000 mg | Freq: Once | INTRAMUSCULAR | Status: AC
  Administered 2023-05-22: 4 mg via INTRAVENOUS
  Filled 2023-05-22: qty 2

## 2023-05-22 MED ORDER — ONDANSETRON 4 MG PO TBDP: 4.0000 mg | ORAL_TABLET | Freq: Three times a day (TID) | ORAL | 0 refills | Status: DC | PRN

## 2023-05-22 NOTE — ED Triage Notes (Signed)
Pt arrives to ED with c/o emesis, nausea, vomiting x3 days.

## 2023-05-22 NOTE — ED Notes (Signed)
Pt states she is feeling better after receiving fluids and zofran. Ready for d/c

## 2023-05-22 NOTE — ED Notes (Signed)
Pt states she is unable to void until she receives fluids.

## 2023-05-22 NOTE — ED Provider Notes (Signed)
[place] EMERGENCY DEPARTMENT AT [facility] Provider Note   CSN: [id number] Arrival date & time: 05/22/23  1444     History Chief Complaint  Patient presents with   Emesis    Gina Dorsey is a 32 y.o. female.  Patient presents to the emergency department complaints of vomiting.  She reports this been ongoing for the last few days and is currently not able to tolerate oral intake without vomiting.  Denies any fevers, abdominal pain, diarrhea, abdominal pain. Has presented to the ED previously for similar symptoms which at those visits appeared to potentially be due to cannabis use. States last use of cannabis was approximately 3 days ago. Denies any other substance use at this time.   Emesis Associated symptoms: abdominal pain        Home Medications Prior to Admission medications   Medication Sig Start Date End Date Taking? Authorizing Provider  ondansetron (ZOFRAN-ODT) 4 MG disintegrating tablet Take 1 tablet (4 mg total) by mouth every 8 (eight) hours as needed for nausea or vomiting. 05/22/23  Yes Tarique Loveall A, PA-C  dicyclomine (BENTYL) 20 MG tablet Take 1 tablet (20 mg total) by mouth 2 (two) times daily. 05/10/23   Meryl Dare, MD  FLUoxetine (PROZAC) 10 MG capsule Take 1 capsule (10 mg total) by mouth daily. 04/20/23   Shanna Cisco, NP  gabapentin (NEURONTIN) 100 MG capsule Take 1 capsule (100 mg total) by mouth 3 (three) times daily. 04/20/23   Shanna Cisco, NP  hydrOXYzine (ATARAX) 25 MG tablet Take 1 tablet (25 mg total) by mouth 3 (three) times daily as needed for anxiety. 04/20/23   Shanna Cisco, NP  OLANZapine zydis (ZYPREXA) 15 MG disintegrating tablet Take 0.5 tablets (7.5 mg total) by mouth at bedtime. 04/20/23   Shanna Cisco, NP  ondansetron (ZOFRAN-ODT) 8 MG disintegrating tablet Take 1 tablet (8 mg total) by mouth 2 (two) times daily as needed for nausea or vomiting. 05/10/23   Meryl Dare, MD  Probiotic Product  (PROBIOTIC ADVANCED PO) Take 1 capsule by mouth 2 (two) times a week.    [provider]  traZODone (DESYREL) 50 MG tablet Take 1 tablet (50 mg total) by mouth at bedtime as needed for sleep. 04/20/23   Shanna Cisco, NP      Allergies    Fish-derived products, Latex, Mucinex [guaifenesin er], Nitrofurantoin, Reglan [metoclopramide], Shellfish-derived products, Sulfa antibiotics, Sulfamethoxazole-trimethoprim, Bupropion, Citalopram hydrobromide, and Morphine and codeine    Review of Systems   Review of Systems  Gastrointestinal:  Positive for abdominal pain, nausea and vomiting.  All other systems reviewed and are negative.   Physical Exam Updated Vital Signs BP (!) 143/95   Pulse 82   Temp 98.9 F (37.2 C) (Oral)   Resp 16   Ht 5\' 4"  (1.626 m)   Wt 60.8 kg   SpO2 99%   BMI 23.00 kg/m  Physical Exam Vitals and nursing note reviewed.  Constitutional:      General: She is not in acute distress.    Appearance: She is well-developed.  HENT:     Head: Normocephalic and atraumatic.  Eyes:     Conjunctiva/sclera: Conjunctivae normal.  Cardiovascular:     Rate and Rhythm: Normal rate and regular rhythm.     Heart sounds: No murmur heard. Pulmonary:     Effort: Pulmonary effort is normal. No respiratory distress.     Breath sounds: Normal breath sounds.  Abdominal:  Palpations: Abdomen is soft.     Tenderness: There is no abdominal tenderness.  Musculoskeletal:        General: No swelling.     Cervical back: Neck supple.  Skin:    General: Skin is warm and dry.     Capillary Refill: Capillary refill takes less than 2 seconds.  Neurological:     Mental Status: She is alert.  Psychiatric:        Mood and Affect: Mood normal.     ED Results / Procedures / Treatments   Labs (all labs ordered are listed, but only abnormal results are displayed) Labs Reviewed  LIPASE, BLOOD - Abnormal; Notable for the following components:      Result Value   Lipase <10  (*)    All other components within normal limits  COMPREHENSIVE METABOLIC PANEL - Abnormal; Notable for the following components:   Glucose, Bld 147 (*)    BUN 22 (*)    All other components within normal limits  CBC - Abnormal; Notable for the following components:   WBC 11.5 (*)    All other components within normal limits  URINALYSIS, ROUTINE W REFLEX MICROSCOPIC - Abnormal; Notable for the following components:   Specific Gravity, Urine 1.031 (*)    Hgb urine dipstick SMALL (*)    Protein, ur 30 (*)    All other components within normal limits  PREGNANCY, URINE    EKG None  Radiology No results found.  Procedures Procedures   Medications Ordered in ED Medications  sodium chloride 0.9 % bolus 1,000 mL (0 mLs Intravenous Stopped 05/22/23 1758)  ondansetron (ZOFRAN) injection 4 mg (4 mg Intravenous Given 05/22/23 1708)    ED Course/ Medical Decision Making/ A&P                           Medical Decision Making Amount and/or Complexity of Data Reviewed Labs: ordered.  Risk Prescription drug management.   This patient presents to the ED for concern of emesis.  Differential diagnosis includes cannabinoid hyperemesis syndrome, gastroenteritis, bowel obstruction, appendicitis, cholecystitis   Lab Tests:  I Ordered, and personally interpreted labs.  The pertinent results include: CBC with mild leukocytosis, CMP with elevation in BUN but renal function otherwise unimpaired, lipase negative, urine pregnancy negative, urinalysis with some notable dehydration with increased specific gravity no evidence of infection   Medicines ordered and prescription drug management:  I ordered medication including fluids, Zofran for dehydration, nausea Reevaluation of the patient after these medicines showed that the patient improved I have reviewed the patients home medicines and have made adjustments as needed   Problem List / ED Course:  Patient presents to the emergency  department complaints of vomiting and abdominal pain.  Reports that this has been ongoing for the last 2 days.  No known around sick contacts at home with similar symptoms but does believe that she may have eaten something that triggered her symptoms.  Denies any substance use but does endorse that she did use marijuana about 3 days ago.  Patient has been seen previously she has tested positive for cannabis use at those times.  Will evaluate patient with labs and begin treatment with fluids and antiemetics.  Will reassess patient shortly.  No acute indication for imaging at this time as patient does not have any abdominal tenderness on initial examination. Patient reports significant improvement in symptoms with the use of fluids and IV Zofran. No active vomiting  witnessed here in the ED. Will discharge patient with new prescription for Zofran ODT that can taken at home for continued management of nausea. Advised against the use of cannabis given patient's history of frequent episodes of vomiting. Patient agreeable with treatment plan and verbalized understanding all return precautions.  Final Clinical Impression(s) / ED Diagnoses Final diagnoses:  Nausea and vomiting, unspecified vomiting type    Rx / DC Orders ED Discharge Orders          Ordered    ondansetron (ZOFRAN-ODT) 4 MG disintegrating tablet  Every 8 hours PRN        05/22/23 1747              Smitty Knudsen, PA-C 05/23/23 1610    Gwyneth Sprout, MD 05/26/23 1752

## 2023-05-22 NOTE — Discharge Instructions (Addendum)
You were seen in the emergency department for nausea and vomiting.  After administration of fluids and Zofran, he reported significant improvement in your symptoms and you are stable for discharge.  Your labs were largely reassuring.  If you have any acute worsening of your symptoms, return to the emergency department for further evaluation.

## 2023-06-05 ENCOUNTER — Ambulatory Visit (HOSPITAL_COMMUNITY): Payer: No Typology Code available for payment source | Admitting: Clinical

## 2023-06-13 ENCOUNTER — Other Ambulatory Visit: Payer: Self-pay | Admitting: Physical Medicine and Rehabilitation

## 2023-07-24 ENCOUNTER — Ambulatory Visit (INDEPENDENT_AMBULATORY_CARE_PROVIDER_SITE_OTHER): Payer: No Typology Code available for payment source | Admitting: Psychiatry

## 2023-07-24 ENCOUNTER — Encounter (HOSPITAL_COMMUNITY): Payer: Self-pay | Admitting: Psychiatry

## 2023-07-24 DIAGNOSIS — F603 Borderline personality disorder: Secondary | ICD-10-CM

## 2023-07-24 DIAGNOSIS — F411 Generalized anxiety disorder: Secondary | ICD-10-CM | POA: Diagnosis not present

## 2023-07-24 MED ORDER — HYDROXYZINE HCL 25 MG PO TABS
25.0000 mg | ORAL_TABLET | Freq: Three times a day (TID) | ORAL | 3 refills | Status: DC | PRN
Start: 2023-07-24 — End: 2023-10-06

## 2023-07-24 MED ORDER — FLUOXETINE HCL 10 MG PO CAPS: 10.0000 mg | ORAL_CAPSULE | Freq: Every day | ORAL | 3 refills | Status: DC

## 2023-07-24 MED ORDER — GABAPENTIN 100 MG PO CAPS: 100.0000 mg | ORAL_CAPSULE | Freq: Three times a day (TID) | ORAL | 3 refills | Status: DC

## 2023-07-24 MED ORDER — OLANZAPINE 15 MG PO TBDP: 7.5000 mg | ORAL_TABLET | Freq: Every day | ORAL | 3 refills | Status: DC

## 2023-07-24 MED ORDER — TRAZODONE HCL 50 MG PO TABS
50.0000 mg | ORAL_TABLET | Freq: Every evening | ORAL | 3 refills | Status: DC | PRN
Start: 2023-07-24 — End: 2023-10-06
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
## 2023-07-29 ENCOUNTER — Other Ambulatory Visit: Payer: Self-pay

## 2023-07-29 ENCOUNTER — Encounter (HOSPITAL_COMMUNITY): Payer: Self-pay

## 2023-07-29 ENCOUNTER — Emergency Department (HOSPITAL_COMMUNITY)
Admission: EM | Admit: 2023-07-29 | Discharge: 2023-07-30 | Disposition: A | Discharge status: Home / Self Care | Payer: No Typology Code available for payment source | Attending: Emergency Medicine | Admitting: Emergency Medicine

## 2023-07-29 DIAGNOSIS — T402X1A Poisoning by other opioids, accidental (unintentional), initial encounter: Secondary | ICD-10-CM | POA: Diagnosis present

## 2023-07-29 DIAGNOSIS — Z91040 Latex allergy status: Secondary | ICD-10-CM | POA: Insufficient documentation

## 2023-07-29 DIAGNOSIS — D72829 Elevated white blood cell count, unspecified: Secondary | ICD-10-CM | POA: Diagnosis not present

## 2023-07-29 DIAGNOSIS — T50901A Poisoning by unspecified drugs, medicaments and biological substances, accidental (unintentional), initial encounter: Secondary | ICD-10-CM

## 2023-07-29 HISTORY — DX: Opioid abuse, uncomplicated: F11.10

## 2023-07-29 MED ORDER — ONDANSETRON HCL 4 MG/2ML IJ SOLN
4.0000 mg | Freq: Once | INTRAMUSCULAR | Status: AC
  Administered 2023-07-30: 4 mg via INTRAVENOUS
  Filled 2023-07-29: qty 2

## 2023-07-29 NOTE — ED Notes (Signed)
Dr. Nicanor Alcon notified and present to bedside promptly.
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
## 2023-07-30 DIAGNOSIS — T402X1A Poisoning by other opioids, accidental (unintentional), initial encounter: Secondary | ICD-10-CM | POA: Diagnosis not present

## 2023-07-30 LAB — CBC WITH DIFFERENTIAL/PLATELET
Abs Immature Granulocytes: 0.06 10*3/uL (ref 0.00–0.07)
Basophils Absolute: 0 10*3/uL (ref 0.0–0.1)
Basophils Relative: 0 %
Eosinophils Absolute: 0.2 10*3/uL (ref 0.0–0.5)
Eosinophils Relative: 2 %
HCT: 35.1 % — ABNORMAL LOW (ref 36.0–46.0)
Hemoglobin: 11.4 g/dL — ABNORMAL LOW (ref 12.0–15.0)
Immature Granulocytes: 0 %
Lymphocytes Relative: 11 %
Lymphs Abs: 1.6 10*3/uL (ref 0.7–4.0)
MCH: 29.7 pg (ref 26.0–34.0)
MCHC: 32.5 g/dL (ref 30.0–36.0)
MCV: 91.4 fL (ref 80.0–100.0)
Monocytes Absolute: 0.8 10*3/uL (ref 0.1–1.0)
Monocytes Relative: 5 %
Neutro Abs: 12.6 10*3/uL — ABNORMAL HIGH (ref 1.7–7.7)
Neutrophils Relative %: 82 %
Platelets: 214 10*3/uL (ref 150–400)
RBC: 3.84 MIL/uL — ABNORMAL LOW (ref 3.87–5.11)
RDW: 14.9 % (ref 11.5–15.5)
WBC: 15.4 10*3/uL — ABNORMAL HIGH (ref 4.0–10.5)
nRBC: 0 % (ref 0.0–0.2)

## 2023-07-30 LAB — I-STAT CHEM 8, ED
BUN: 15 mg/dL (ref 6–20)
Calcium, Ion: 1.16 mmol/L (ref 1.15–1.40)
Chloride: 100 mmol/L (ref 98–111)
Creatinine, Ser: 0.9 mg/dL (ref 0.44–1.00)
Glucose, Bld: 109 mg/dL — ABNORMAL HIGH (ref 70–99)
HCT: 35 % — ABNORMAL LOW (ref 36.0–46.0)
Hemoglobin: 11.9 g/dL — ABNORMAL LOW (ref 12.0–15.0)
Potassium: 4.1 mmol/L (ref 3.5–5.1)
Sodium: 136 mmol/L (ref 135–145)
TCO2: 25 mmol/L (ref 22–32)

## 2023-07-30 NOTE — ED Notes (Signed)
Pt place on 2L [place] due to low oxygen levels.

## 2023-07-30 NOTE — ED Notes (Addendum)
Pt is up trying to call for ride. She is tearful that her parents will not answer her call. States she is ready to go home. EDP made aware

## 2023-07-30 NOTE — ED Provider Notes (Signed)
EMERGENCY DEPARTMENT AT [facility] Provider Note   CSN: [id number] Arrival date & time: 07/29/23  2303     History  Chief Complaint  Patient presents with   Drug Overdose   Vomiting    Gina Dorsey is a 32 y.o. female.  The history is provided by the patient.  Drug Overdose This is a recurrent problem. The current episode started less than 1 hour ago. The problem occurs constantly. The problem has been resolved. Pertinent negatives include no chest pain, no abdominal pain, no headaches and no shortness of breath. Nothing aggravates the symptoms. Nothing relieves the symptoms. Treatments tried: narcan. The treatment provided significant relief.  Patient with a history of polysubstance abuse presents with overdose that was accidental on oxycodone reportedly.  Patient received narcan and is now awake and alert.  Patient is awake and alert at this time.       Home Medications Prior to Admission medications   Medication Sig Start Date End Date Taking? Authorizing Provider  dicyclomine (BENTYL) 20 MG tablet Take 1 tablet (20 mg total) by mouth 2 (two) times daily. 05/10/23   Meryl Dare, MD  FLUoxetine (PROZAC) 10 MG capsule Take 1 capsule (10 mg total) by mouth daily. 07/24/23   Shanna Cisco, NP  gabapentin (NEURONTIN) 100 MG capsule Take 1 capsule (100 mg total) by mouth 3 (three) times daily. 07/24/23   Shanna Cisco, NP  hydrOXYzine (ATARAX) 25 MG tablet Take 1 tablet (25 mg total) by mouth 3 (three) times daily as needed for anxiety. 07/24/23   Shanna Cisco, NP  OLANZapine zydis (ZYPREXA) 15 MG disintegrating tablet Take 0.5 tablets (7.5 mg total) by mouth at bedtime. 07/24/23   Shanna Cisco, NP  ondansetron (ZOFRAN-ODT) 4 MG disintegrating tablet Take 1 tablet (4 mg total) by mouth every 8 (eight) hours as needed for nausea or vomiting. 05/22/23   Smitty Knudsen, PA-C  ondansetron (ZOFRAN-ODT) 8 MG disintegrating tablet Take 1  tablet (8 mg total) by mouth 2 (two) times daily as needed for nausea or vomiting. 05/10/23   Meryl Dare, MD  Probiotic Product (PROBIOTIC ADVANCED PO) Take 1 capsule by mouth 2 (two) times a week.    [provider]  traZODone (DESYREL) 50 MG tablet Take 1 tablet (50 mg total) by mouth at bedtime as needed for sleep. 07/24/23   Shanna Cisco, NP      Allergies    Fish-derived products, Latex, Mucinex [guaifenesin er], Nitrofurantoin, Reglan [metoclopramide], Shellfish-derived products, Sulfa antibiotics, Sulfamethoxazole-trimethoprim, Bupropion, Citalopram hydrobromide, and Morphine and codeine    Review of Systems   Review of Systems  Constitutional:  Negative for fever.  HENT:  Negative for ear pain.   Respiratory:  Negative for shortness of breath.   Cardiovascular:  Negative for chest pain.  Gastrointestinal:  Negative for abdominal pain.  Genitourinary:  Negative for dysuria.  Neurological:  Negative for headaches.  Psychiatric/Behavioral:  Negative for behavioral problems.   All other systems reviewed and are negative.   Physical Exam Updated Vital Signs BP 112/64   Pulse 92   Temp 98.5 F (36.9 C) (Oral)   Resp 17   Ht 5\' 4"  (1.626 m)   Wt 66.4 kg   LMP 07/29/2023 (Exact Date)   SpO2 97%   BMI 25.13 kg/m  Physical Exam Vitals and nursing note reviewed.  Constitutional:      General: She is not in acute distress.    Appearance:  She is well-developed.  HENT:     Head: Normocephalic and atraumatic.     Nose: Nose normal.  Eyes:     Pupils: Pupils are equal, round, and reactive to light.  Cardiovascular:     Rate and Rhythm: Normal rate and regular rhythm.     Pulses: Normal pulses.     Heart sounds: Normal heart sounds.  Pulmonary:     Effort: Pulmonary effort is normal. No respiratory distress.     Breath sounds: Normal breath sounds.  Abdominal:     General: Bowel sounds are normal. There is no distension.     Palpations: Abdomen is soft.      Tenderness: There is no abdominal tenderness. There is no guarding or rebound.  Musculoskeletal:        General: Normal range of motion.     Cervical back: Normal range of motion and neck supple.  Skin:    General: Skin is dry.     Capillary Refill: Capillary refill takes less than 2 seconds.     Findings: No erythema or rash.  Neurological:     General: No focal deficit present.     Mental Status: She is oriented to person, place, and time.     Deep Tendon Reflexes: Reflexes normal.  Psychiatric:        Mood and Affect: Mood normal.    ED Results / Procedures / Treatments   Labs (all labs ordered are listed, but only abnormal results are displayed) Results for orders placed or performed during the hospital encounter of 07/29/23  CBC with Differential  Result Value Ref Range   WBC 15.4 (H) 4.0 - 10.5 K/uL   RBC 3.84 (L) 3.87 - 5.11 MIL/uL   Hemoglobin 11.4 (L) 12.0 - 15.0 g/dL   HCT 16.1 (L) 09.6 - 04.5 %   MCV 91.4 80.0 - 100.0 fL   MCH 29.7 26.0 - 34.0 pg   MCHC 32.5 30.0 - 36.0 g/dL   RDW 40.9 81.1 - 91.4 %   Platelets 214 150 - 400 K/uL   nRBC 0.0 0.0 - 0.2 %   Neutrophils Relative % 82 %   Neutro Abs 12.6 (H) 1.7 - 7.7 K/uL   Lymphocytes Relative 11 %   Lymphs Abs 1.6 0.7 - 4.0 K/uL   Monocytes Relative 5 %   Monocytes Absolute 0.8 0.1 - 1.0 K/uL   Eosinophils Relative 2 %   Eosinophils Absolute 0.2 0.0 - 0.5 K/uL   Basophils Relative 0 %   Basophils Absolute 0.0 0.0 - 0.1 K/uL   Immature Granulocytes 0 %   Abs Immature Granulocytes 0.06 0.00 - 0.07 K/uL  I-stat chem 8, ED (not at [facility], [facility] or [facility])  Result Value Ref Range   Sodium 136 135 - 145 mmol/L   Potassium 4.1 3.5 - 5.1 mmol/L   Chloride 100 98 - 111 mmol/L   BUN 15 6 - 20 mg/dL   Creatinine, Ser 7.82 0.44 - 1.00 mg/dL   Glucose, Bld 956 (H) 70 - 99 mg/dL   Calcium, Ion 2.13 0.86 - 1.40 mmol/L   TCO2 25 22 - 32 mmol/L   Hemoglobin 11.9 (L) 12.0 - 15.0 g/dL   HCT 57.8 (L) 46.9 - 62.9 %   No  results found.   Radiology No results found.  Procedures Procedures    Medications Ordered in ED Medications  ondansetron (ZOFRAN) injection 4 mg (4 mg Intravenous Given 07/30/23 0156)    ED Course/ Medical Decision Making/  A&P                                 Medical Decision Making Patient with a h/o substance abuse who accidentally overdosed and was   Amount and/or Complexity of Data Reviewed Labs: ordered.    Details: White count slight elevation 15.4 low hemoglobin 11.4, normal platelets . Normal sodium 136, normal potassium 4.1, normal creatinine 0.9   Risk Prescription drug management. Risk Details: Awake and alert and PO challenging in the department.  No SI or HI.  Stable for discharge.  Strict return.      Final Clinical Impression(s) / ED Diagnoses Final diagnoses:  Accidental overdose, initial encounter   Return for intractable cough, coughing up blood, fevers > 100.4 unrelieved by medication, shortness of breath, intractable vomiting, chest pain, shortness of breath, weakness, numbness, changes in speech, facial asymmetry, abdominal pain, passing out, Inability to tolerate liquids or food, cough, altered mental status or any concerns. No signs of systemic illness or infection. The patient is nontoxic-appearing on exam and vital signs are within normal limits.  I have reviewed the triage vital signs and the nursing notes. Pertinent labs & imaging results that were available during my care of the patient were reviewed by me and considered in my medical decision making (see chart for details). After history, exam, and medical workup I feel the patient has been appropriately medically screened and is safe for discharge home. Pertinent diagnoses were discussed with the patient. Patient was given return precautions.  Rx / DC Orders ED Discharge Orders     None         Courtenay Hirth, MD 07/30/23 1610

## 2023-08-17 ENCOUNTER — Other Ambulatory Visit (HOSPITAL_COMMUNITY): Payer: Self-pay | Admitting: Psychiatry

## 2023-08-17 DIAGNOSIS — F603 Borderline personality disorder: Secondary | ICD-10-CM

## 2023-08-20 ENCOUNTER — Ambulatory Visit (HOSPITAL_COMMUNITY)
Admission: EM | Admit: 2023-08-20 | Discharge: 2023-08-20 | Disposition: A | Discharge status: Home / Self Care | Payer: Medicaid Other | Attending: Family Medicine | Admitting: Family Medicine

## 2023-08-20 ENCOUNTER — Encounter (HOSPITAL_COMMUNITY): Payer: Self-pay

## 2023-08-20 DIAGNOSIS — J189 Pneumonia, unspecified organism: Secondary | ICD-10-CM | POA: Diagnosis not present

## 2023-08-20 MED ORDER — AZITHROMYCIN 250 MG PO TABS: ORAL_TABLET | ORAL | 0 refills | Status: DC
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
## 2023-08-20 NOTE — ED Triage Notes (Signed)
Patient reports that she has had nasal congestion with green drainage x 3 days and began coughing yesterday-non productive.  Patient states she had a negative home Covid test last night and denies taking any medications for her symptoms.

## 2023-08-20 NOTE — Discharge Instructions (Signed)
It was nice seeing you today. You might have walking pneumonia. We'll treat with antibiotics. If your symptoms worsen, please return for a chest xray.

## 2023-08-22 ENCOUNTER — Encounter (HOSPITAL_COMMUNITY): Payer: Self-pay

## 2023-08-22 ENCOUNTER — Other Ambulatory Visit: Payer: Self-pay

## 2023-08-22 ENCOUNTER — Emergency Department (HOSPITAL_COMMUNITY)
Admission: EM | Admit: 2023-08-22 | Discharge: 2023-08-22 | Discharge status: Against Medical Advice | Payer: Medicaid Other | Attending: Emergency Medicine | Admitting: Emergency Medicine

## 2023-08-22 DIAGNOSIS — R112 Nausea with vomiting, unspecified: Secondary | ICD-10-CM | POA: Insufficient documentation

## 2023-08-22 DIAGNOSIS — R1012 Left upper quadrant pain: Secondary | ICD-10-CM | POA: Insufficient documentation

## 2023-08-22 DIAGNOSIS — Z5321 Procedure and treatment not carried out due to patient leaving prior to being seen by health care provider: Secondary | ICD-10-CM | POA: Diagnosis not present

## 2023-08-22 NOTE — ED Triage Notes (Signed)
LUQ abodminal pain that started at 0300, at 0600 pt started having N/V. Took zofran at 1000, but it is not helping. Pt has had blood intermittently when she throws up. Pt is currently on Zpack.

## 2023-09-07 ENCOUNTER — Telehealth (HOSPITAL_COMMUNITY): Payer: Self-pay

## 2023-09-07 NOTE — Telephone Encounter (Signed)
Therapist sent the following email: " She saw Gretchen Short, DNP on 08-17-23 and it said she would be seen in 3 months, but I don't see any upcoming appointments"   Remigio Eisenmenger, MS., LMFT, LCAS 09-07-23

## 2023-10-03 ENCOUNTER — Telehealth: Payer: Self-pay | Admitting: Gastroenterology

## 2023-10-03 MED ORDER — PROMETHAZINE HCL 50 MG RE SUPP: 50.0000 mg | Freq: Four times a day (QID) | RECTAL | 1 refills | Status: DC | PRN

## 2023-10-03 MED ORDER — PROMETHAZINE HCL 25 MG RE SUPP: 25.0000 mg | Freq: Four times a day (QID) | RECTAL | 1 refills | Status: DC | PRN

## 2023-10-03 NOTE — Telephone Encounter (Signed)
Inbound call from patient, states she was prescribed suppositories, and she would like those refilled to [facility] on [address].

## 2023-10-03 NOTE — Telephone Encounter (Signed)
Patient is requesting medication to be sent to [facility] on [address] due to medication being on back order.

## 2023-10-03 NOTE — Telephone Encounter (Signed)
Patient states she has been previously prescribed promethazine suppositories for nausea and the refills are expired. Patient is requesting a refill be sent to her pharmacy. Informed patient I will send a refill to her pharmacy but do not take zofran and the suppositories together. Patient verbalized understanding.

## 2023-10-03 NOTE — Telephone Encounter (Signed)
Prescription sent to patient's pharmacy per patient's request. 

## 2023-10-03 NOTE — Telephone Encounter (Signed)
Called [facility] on [address] and cancelled the prescription for promethazine suppositories. Sent in the suppositories 25 mg to [facility] on [address] per patient's request.

## 2023-10-03 NOTE — Addendum Note (Signed)
Addended by: Illene Bolus on: 10/03/2023 03:44 PM   Modules accepted: Orders

## 2023-10-03 NOTE — Addendum Note (Signed)
Addended by: Illene Bolus on: 10/03/2023 02:48 PM   Modules accepted: Orders

## 2023-10-03 NOTE — Telephone Encounter (Signed)
[facility] rep called to inform the office that they promethazine is on back order. Asked if a change can be made to 25 mg instead.  Tele [phone]

## 2023-10-03 NOTE — Telephone Encounter (Signed)
Inbound call from patient, states she no longer wishes to have it sent to [facility], now wants the 25 mg instead.

## 2023-10-06 ENCOUNTER — Encounter (HOSPITAL_COMMUNITY): Payer: Self-pay | Admitting: Psychiatry

## 2023-10-06 ENCOUNTER — Telehealth (HOSPITAL_COMMUNITY): Payer: Medicaid Other | Admitting: Psychiatry

## 2023-10-06 DIAGNOSIS — F411 Generalized anxiety disorder: Secondary | ICD-10-CM

## 2023-10-06 DIAGNOSIS — F603 Borderline personality disorder: Secondary | ICD-10-CM

## 2023-10-06 MED ORDER — TRAZODONE HCL 50 MG PO TABS
50.0000 mg | ORAL_TABLET | Freq: Every evening | ORAL | 3 refills | Status: DC | PRN
Start: 2023-10-06 — End: 2023-12-20

## 2023-10-06 MED ORDER — HYDROXYZINE HCL 25 MG PO TABS
25.0000 mg | ORAL_TABLET | Freq: Three times a day (TID) | ORAL | 3 refills | Status: DC | PRN
Start: 2023-10-06 — End: 2023-12-20

## 2023-10-06 MED ORDER — OLANZAPINE 15 MG PO TBDP: 7.5000 mg | ORAL_TABLET | Freq: Every day | ORAL | 3 refills | Status: DC

## 2023-10-06 MED ORDER — FLUOXETINE HCL 10 MG PO CAPS: 10.0000 mg | ORAL_CAPSULE | Freq: Every day | ORAL | 3 refills | Status: DC

## 2023-10-06 MED ORDER — GABAPENTIN 100 MG PO CAPS: 100.0000 mg | ORAL_CAPSULE | Freq: Three times a day (TID) | ORAL | 3 refills | Status: DC
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
## 2023-11-27 NOTE — Telephone Encounter (Signed)
sent 

## 2023-11-27 NOTE — Telephone Encounter (Signed)
Sent!

## 2023-12-20 ENCOUNTER — Encounter (HOSPITAL_COMMUNITY): Payer: Self-pay | Admitting: Psychiatry

## 2023-12-20 ENCOUNTER — Telehealth (HOSPITAL_COMMUNITY): Payer: Medicaid Other | Admitting: Psychiatry

## 2023-12-20 DIAGNOSIS — F411 Generalized anxiety disorder: Secondary | ICD-10-CM

## 2023-12-20 DIAGNOSIS — F603 Borderline personality disorder: Secondary | ICD-10-CM | POA: Diagnosis not present

## 2023-12-20 DIAGNOSIS — Z0000 Encounter for general adult medical examination without abnormal findings: Secondary | ICD-10-CM

## 2023-12-20 MED ORDER — TRAZODONE HCL 100 MG PO TABS
100.0000 mg | ORAL_TABLET | Freq: Every evening | ORAL | 3 refills | Status: DC | PRN
Start: 2023-12-20 — End: 2024-03-13

## 2023-12-20 MED ORDER — HYDROXYZINE HCL 25 MG PO TABS
25.0000 mg | ORAL_TABLET | Freq: Three times a day (TID) | ORAL | 3 refills | Status: DC | PRN
Start: 2023-12-20 — End: 2024-03-13

## 2023-12-20 MED ORDER — FLUOXETINE HCL 20 MG PO CAPS: 20.0000 mg | ORAL_CAPSULE | Freq: Every day | ORAL | 3 refills | Status: DC

## 2023-12-20 MED ORDER — GABAPENTIN 300 MG PO CAPS: 300.0000 mg | ORAL_CAPSULE | Freq: Three times a day (TID) | ORAL | 3 refills | Status: DC

## 2023-12-20 MED ORDER — OLANZAPINE 15 MG PO TBDP
7.5000 mg | ORAL_TABLET | Freq: Every day | ORAL | 3 refills | Status: DC
Start: 2023-12-20 — End: 2024-03-13
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
## 2024-01-09 ENCOUNTER — Encounter (HOSPITAL_COMMUNITY): Payer: Self-pay

## 2024-01-09 ENCOUNTER — Emergency Department (HOSPITAL_COMMUNITY)
Admission: EM | Admit: 2024-01-09 | Discharge: 2024-01-09 | Discharge status: Against Medical Advice | Payer: Medicaid Other | Attending: Emergency Medicine | Admitting: Emergency Medicine

## 2024-01-09 ENCOUNTER — Other Ambulatory Visit: Payer: Self-pay

## 2024-01-09 DIAGNOSIS — R109 Unspecified abdominal pain: Secondary | ICD-10-CM | POA: Insufficient documentation

## 2024-01-09 DIAGNOSIS — R1110 Vomiting, unspecified: Secondary | ICD-10-CM | POA: Diagnosis not present

## 2024-01-09 DIAGNOSIS — Z5321 Procedure and treatment not carried out due to patient leaving prior to being seen by health care provider: Secondary | ICD-10-CM | POA: Diagnosis not present

## 2024-01-09 LAB — URINALYSIS, ROUTINE W REFLEX MICROSCOPIC
Bacteria, UA: NONE SEEN
Bilirubin Urine: NEGATIVE
Glucose, UA: NEGATIVE mg/dL
Ketones, ur: NEGATIVE mg/dL
Leukocytes,Ua: NEGATIVE
Nitrite: NEGATIVE
Protein, ur: NEGATIVE mg/dL
Specific Gravity, Urine: 1.016 (ref 1.005–1.030)
pH: 6 (ref 5.0–8.0)

## 2024-01-09 LAB — CBC WITH DIFFERENTIAL/PLATELET
Abs Immature Granulocytes: 0.02 10*3/uL (ref 0.00–0.07)
Basophils Absolute: 0 10*3/uL (ref 0.0–0.1)
Basophils Relative: 0 %
Eosinophils Absolute: 0.1 10*3/uL (ref 0.0–0.5)
Eosinophils Relative: 1 %
HCT: 49 % — ABNORMAL HIGH (ref 36.0–46.0)
Hemoglobin: 16.2 g/dL — ABNORMAL HIGH (ref 12.0–15.0)
Immature Granulocytes: 0 %
Lymphocytes Relative: 15 %
Lymphs Abs: 1.8 10*3/uL (ref 0.7–4.0)
MCH: 30.1 pg (ref 26.0–34.0)
MCHC: 33.1 g/dL (ref 30.0–36.0)
MCV: 91.1 fL (ref 80.0–100.0)
Monocytes Absolute: 0.4 10*3/uL (ref 0.1–1.0)
Monocytes Relative: 4 %
Neutro Abs: 10 10*3/uL — ABNORMAL HIGH (ref 1.7–7.7)
Neutrophils Relative %: 80 %
Platelets: 406 10*3/uL — ABNORMAL HIGH (ref 150–400)
RBC: 5.38 MIL/uL — ABNORMAL HIGH (ref 3.87–5.11)
RDW: 13.4 % (ref 11.5–15.5)
WBC: 12.4 10*3/uL — ABNORMAL HIGH (ref 4.0–10.5)
nRBC: 0 % (ref 0.0–0.2)

## 2024-01-09 LAB — COMPREHENSIVE METABOLIC PANEL
ALT: 12 U/L (ref 0–44)
AST: 17 U/L (ref 15–41)
Albumin: 5.2 g/dL — ABNORMAL HIGH (ref 3.5–5.0)
Alkaline Phosphatase: 91 U/L (ref 38–126)
Anion gap: 10 (ref 5–15)
BUN: 14 mg/dL (ref 6–20)
CO2: 26 mmol/L (ref 22–32)
Calcium: 10.2 mg/dL (ref 8.9–10.3)
Chloride: 104 mmol/L (ref 98–111)
Creatinine, Ser: 0.89 mg/dL (ref 0.44–1.00)
GFR, Estimated: >60 mL/min (ref >60)
Glucose, Bld: 127 mg/dL — ABNORMAL HIGH (ref 70–99)
Potassium: 4.3 mmol/L (ref 3.5–5.1)
Sodium: 140 mmol/L (ref 135–145)
Total Bilirubin: 0.6 mg/dL (ref 0.0–1.2)
Total Protein: 8.9 g/dL — ABNORMAL HIGH (ref 6.5–8.1)

## 2024-01-09 MED ORDER — TRAMADOL HCL 50 MG PO TABS
100.0000 mg | ORAL_TABLET | Freq: Once | ORAL | Status: AC
  Administered 2024-01-09: 100 mg via ORAL
  Filled 2024-01-09: qty 2

## 2024-01-09 NOTE — ED Notes (Signed)
Noted pt stepped out of lobby to smoke

## 2024-01-09 NOTE — ED Triage Notes (Signed)
Abdominal pain since 5am also vomiting. BIBA.  Denies blood in stool. Hasn't been able to take her tramadol because she had no transportation. 2 episodes of vomiting with EMS. 160/90 100 99% 126 BG

## 2024-01-09 NOTE — ED Notes (Addendum)
Noted pt ambulatory in lobby. Steady gait.

## 2024-01-09 NOTE — ED Provider Triage Note (Signed)
Emergency Medicine Provider Triage Evaluation Note  Gina Dorsey , a 33 y.o. female  was evaluated in triage.  Pt complains of Abdominal pain.  Review of Systems  Positive: Generalized pain, nausea Negative: Diarrhea  Physical Exam  BP (!) 140/78 (BP Location: Right Arm)   Pulse 78   Temp 98.2 F (36.8 C) (Oral)   Resp 16   LMP 12/30/2023 (Approximate)   SpO2 99%  Gen:   Awake, no distress  Screaming in triage Resp:  Normal effort  MSK:   Moves extremities without difficulty  Other:    Medical Decision Making  Medically screening exam initiated at 8:40 AM.  Appropriate orders placed.  Volanda Napoleon was informed that the remainder of the evaluation will be completed by another provider, this initial triage assessment does not replace that evaluation, and the importance of remaining in the ED until their evaluation is complete.  H/o IBS, out of Tramadol. Last dose 48 hours ago.    Elpidio Anis, PA-C 01/09/24 0840

## 2024-02-12 ENCOUNTER — Encounter: Payer: Self-pay | Admitting: Obstetrics and Gynecology

## 2024-02-12 ENCOUNTER — Ambulatory Visit (INDEPENDENT_AMBULATORY_CARE_PROVIDER_SITE_OTHER): Payer: Medicaid Other | Admitting: Obstetrics and Gynecology

## 2024-02-12 ENCOUNTER — Other Ambulatory Visit (HOSPITAL_COMMUNITY)
Admission: RE | Admit: 2024-02-12 | Discharge: 2024-02-12 | Disposition: A | Discharge status: Home / Self Care | Source: Ambulatory Visit | Attending: Obstetrics and Gynecology | Admitting: Obstetrics and Gynecology

## 2024-02-12 VITALS — BP 122/81 | HR 69 | Wt 165.0 lb

## 2024-02-12 DIAGNOSIS — Z1331 Encounter for screening for depression: Secondary | ICD-10-CM

## 2024-02-12 DIAGNOSIS — Z01419 Encounter for gynecological examination (general) (routine) without abnormal findings: Secondary | ICD-10-CM | POA: Diagnosis present

## 2024-02-12 DIAGNOSIS — G8929 Other chronic pain: Secondary | ICD-10-CM

## 2024-02-12 DIAGNOSIS — Z124 Encounter for screening for malignant neoplasm of cervix: Secondary | ICD-10-CM

## 2024-02-12 DIAGNOSIS — R102 Pelvic and perineal pain: Secondary | ICD-10-CM

## 2024-02-12 DIAGNOSIS — Z1151 Encounter for screening for human papillomavirus (HPV): Secondary | ICD-10-CM | POA: Insufficient documentation

## 2024-02-12 DIAGNOSIS — N946 Dysmenorrhea, unspecified: Secondary | ICD-10-CM

## 2024-02-12 MED ORDER — NORETHINDRONE ACETATE 5 MG PO TABS: 5.0000 mg | ORAL_TABLET | Freq: Every day | ORAL | 2 refills | Status: DC

## 2024-02-12 NOTE — Progress Notes (Unsigned)
 ANNUAL EXAM Patient name: Gina Dorsey MRN [id number]  Date of birth: 1991-03-11 Chief Complaint:   Gynecologic Exam (/)  History of Present Illness:   Gina Dorsey is a 33 y.o. G0P0000 being seen today for a routine annual exam.  Current complaints: dysmenorrhea  Menstrual concerns? Yes   Breast or nipple changes? No  Contraception use? Yes condom; briefly tried opill Sexually active? No   #Dysmenorrhea and Chronic Pelvic pain  Notes having several years of severe pelvic pain, most pronounced with menses. Has tried OTC analgesia without improvement. Notes several ED visits for pain management. Attempted OTC POP which caused prolonged bleeding. Does not want to try IUD as she doesn't want anything in her body. She is willing to try combination - current tobacco use. She is concerned she has endometriosis based on her symptoms. She has no prior surgeries. No prior abnormal pap smear. She endoreses dyspareunia and dyschezia. She has GI issues as well and has been seen by GI for chronic nausea and was started on bentyl and zofran which she uses as needed.  She has no intention of becoming pregnant and would be ok with hysterectomy if necessary. At this time, she is would like diagnostic surgery.   CHC: tobacco use  Patient's last menstrual period was 01/22/2024 (within days).   The pregnancy intention screening data noted above was reviewed. Potential methods of contraception were discussed. The patient elected to proceed with No data recorded.   Last pap No results found for: "DIAGPAP", "HPVHIGH", "ADEQPAP"   H/O abnormal pap: no Last mammogram: n/a.  Last colonoscopy: n/a.      12/20/2023    8:02 AM 10/06/2023    8:10 AM 07/24/2023   11:28 AM 04/20/2023    2:21 PM 04/16/2023   11:59 AM  Depression screen PHQ 2/9  Decreased Interest       Down, Depressed, Hopeless       PHQ - 2 Score       Altered sleeping       Tired, decreased energy       Change in appetite        Feeling bad or failure about yourself        Trouble concentrating       Moving slowly or fidgety/restless       Suicidal thoughts       PHQ-9 Score       Difficult doing work/chores          Information is confidential and restricted. Go to Review Flowsheets to unlock data.        12/20/2023    8:04 AM 10/06/2023    8:11 AM 07/24/2023   11:27 AM 04/20/2023    2:20 PM  GAD 7 : Generalized Anxiety Score  Nervous, Anxious, on Edge      Control/stop worrying      Worry too much - different things      Trouble relaxing      Restless      Easily annoyed or irritable      Afraid - awful might happen      Total GAD 7 Score      Anxiety Difficulty         Information is confidential and restricted. Go to Review Flowsheets to unlock data.     Review of Systems:   Pertinent items are noted in HPI Denies any headaches, blurred vision, fatigue, shortness of breath, chest pain, abdominal pain, abnormal vaginal discharge/itching/odor/irritation, problems  with periods, bowel movements, urination, or intercourse unless otherwise stated above. Pertinent History Reviewed:  Reviewed past medical,surgical, social and family history.  Reviewed problem list, medications and allergies. Physical Assessment:   Vitals:   02/12/24 1347  BP: 122/81  Pulse: 69  Weight: 165 lb (74.8 kg)  Body mass index is 28.32 kg/m.        Physical Examination:   General appearance - well appearing, and in no distress  Mental status - alert, oriented to person, place, and time  Psych:  She has a normal mood and affect  Skin - warm and dry, normal color, no suspicious lesions noted  Chest - effort normal, all lung fields clear to auscultation bilaterally  Heart - normal rate and regular rhythm  Breasts - breasts appear normal, no suspicious masses, no skin or nipple changes or  axillary nodes  Abdomen - soft, nontender, nondistended, no masses or organomegaly  Pelvic -  VULVA: normal appearing vulva with no  masses, tenderness or lesions   VAGINA: normal appearing vagina with normal color and discharge, no lesions   CERVIX: normal appearing cervix without discharge or lesions, no CMT  Thin prep pap is done with HR HPV cotesting  UTERUS: uterus is felt to be normal size, shape, consistency and nontender   ADNEXA: No adnexal masses or tenderness noted.  Normal appearing vulva Normal vulvar sensation bilaterally Posterior vaginal wall tender Right levator ani 5/10 Right ischiococcygeous 6/10 Right obturator internus 5/10 Left levator ani 1/10 Left ischioccocygeous 3/10 Left obturator internus 5/10 Anterior vaginal wall tender  Extremities:  No swelling or varicosities noted  Chaperone present for exam  No results found for this or any previous visit (from the past 24 hours).    Assessment & Plan:   1. Well woman exam with routine gynecological exam (Primary) - Cervical cancer screening: Discussed guidelines. Pap with HPV collected - GC/CT: declines - Birth Control:  abstinence - Breast Health: Encouraged self breast awareness/SBE. Teaching provided.  - F/U 12 months and prn  - Cytology - PAP  2. Screening for cervical cancer - Cytology - PAP  3. Dysmenorrhea - Discussed differential: i.e. secondary dysmenorrhea vs endometriosis vs other etiology - Discussed only way to tell definitively regarding endometriosis is either by US showing an endometrioma or by laparoscopy. Would advise against surgery unless other treatment options have failed or concerns regarding infertility. We removed the impact on [facility] on personal risk of endometriosis.  - Discussed treatment options: Motrin prn severe cramps along with tylenol, OCPs, Nuvaring, Depo, Nexplanon, Lng-IUD and other hormonal options i.e. Pollie Friar. We discussed the risks and benefits of OCPs, including long term benefits and risk of VTE with estrogen containing options. We discussed the role of PFPT.  - We also discussed possible  prevention of progression of endometriosis if this is the cause with use of OCPs, although not definitive.  - We reviewed use of laparoscopy in the assistance of fertility if endometriosis but that would only be after she has tried for at least 6 months.  - At this time, she would like to do: Hormonal therapy with aygestin and Surgical intervention  - Ambulatory Referral For Surgery Scheduling - norethindrone (AYGESTIN) 5 MG tablet; Take 1 tablet (5 mg total) by mouth daily.  Dispense: 30 tablet; Refill: 2  4. Chronic pelvic pain in female Discussed multifactorial nature of pelvic pain - dysmenorrhea possibly due to endometriosis, as well as pelvic myalgia, and chronic GI issues. Discussed that surgery was for diagnostic  purposes with some safe excision with leaving gyn organs in situ. Discussed that if endometriosis noted, would still recommend menstrual suppression for management after surgery.  - Ambulatory Referral For Surgery Scheduling - norethindrone (AYGESTIN) 5 MG tablet; Take 1 tablet (5 mg total) by mouth daily.  Dispense: 30 tablet; Refill: 2  Patient desires surgical management with diagnostic laparoscopy and D&C for dysmenorrhea and pelvic pain.  The risks of surgery were discussed in detail with the patient including but not limited to: bleeding which may require transfusion or reoperation; infection which may require prolonged hospitalization or re-hospitalization and antibiotic therapy; injury to bowel, bladder, ureters and major vessels or other surrounding organs which may lead to other procedures; formation of adhesions; need for additional procedures including laparotomy or subsequent procedures secondary to intraoperative injury or abnormal pathology; thromboembolic phenomenon; incisional problems and other postoperative or anesthesia complications.  Patient was told that the likelihood that her condition and symptoms will be treated effectively with this surgical management was  moderate to high; the postoperative expectations were also discussed in detail. The patient also understands the alternative treatment options which were discussed in full. All questions were answered.  She was told that she will be contacted by our surgical scheduler regarding the time and date of her surgery; routine preoperative instructions will be given to her by the preoperative nursing team.    Printed patient education handouts about the procedure were given to the patient to review at home.   No orders of the defined types were placed in this encounter.   Meds: No orders of the defined types were placed in this encounter.   Follow-up: No follow-ups on file.  Lorriane Shire, MD 02/12/2024 2:12 PM

## 2024-02-15 ENCOUNTER — Other Ambulatory Visit (HOSPITAL_COMMUNITY): Payer: Self-pay | Admitting: Psychiatry

## 2024-02-15 DIAGNOSIS — F603 Borderline personality disorder: Secondary | ICD-10-CM

## 2024-02-16 LAB — CYTOLOGY - PAP
Comment: NEGATIVE
Diagnosis: NEGATIVE
High risk HPV: NEGATIVE

## 2024-02-18 ENCOUNTER — Other Ambulatory Visit: Payer: Self-pay | Admitting: Obstetrics & Gynecology

## 2024-02-18 ENCOUNTER — Encounter (HOSPITAL_BASED_OUTPATIENT_CLINIC_OR_DEPARTMENT_OTHER): Payer: Self-pay | Admitting: Obstetrics & Gynecology

## 2024-02-18 DIAGNOSIS — B9689 Other specified bacterial agents as the cause of diseases classified elsewhere: Secondary | ICD-10-CM

## 2024-02-18 MED ORDER — METRONIDAZOLE 500 MG PO TABS: 500.0000 mg | ORAL_TABLET | Freq: Two times a day (BID) | ORAL | 0 refills | Status: DC

## 2024-02-29 ENCOUNTER — Telehealth: Payer: Self-pay | Admitting: Family Medicine

## 2024-02-29 DIAGNOSIS — G8929 Other chronic pain: Secondary | ICD-10-CM

## 2024-02-29 DIAGNOSIS — N946 Dysmenorrhea, unspecified: Secondary | ICD-10-CM

## 2024-02-29 NOTE — Telephone Encounter (Signed)
 Patient called and had question for nurse she did not state her question just that she wanted a nurse to call back.

## 2024-03-04 NOTE — Telephone Encounter (Signed)
 Returned call to patient. She is currently having menstrual period and reports severe pain-- has had similar issues for past 10 years. Reports she is taking Aygestin as prescribed. Would like update on surgery scheduling process. I explained the request has been sent to surgery scheduler but that I can ask for an update. Depending on how far away surgery is-- she states she may need additional support with medications because pain is impacting ability to work. Also is interested in pelvic PT referral that was offered during appt with Ajewole.

## 2024-03-05 ENCOUNTER — Emergency Department (HOSPITAL_COMMUNITY)
Admission: EM | Admit: 2024-03-05 | Discharge: 2024-03-05 | Disposition: A | Discharge status: Home / Self Care | Attending: Emergency Medicine | Admitting: Emergency Medicine

## 2024-03-05 ENCOUNTER — Encounter (HOSPITAL_COMMUNITY): Payer: Self-pay

## 2024-03-05 ENCOUNTER — Other Ambulatory Visit: Payer: Self-pay

## 2024-03-05 DIAGNOSIS — R1084 Generalized abdominal pain: Secondary | ICD-10-CM

## 2024-03-05 DIAGNOSIS — Z5321 Procedure and treatment not carried out due to patient leaving prior to being seen by health care provider: Secondary | ICD-10-CM | POA: Diagnosis not present

## 2024-03-05 DIAGNOSIS — R339 Retention of urine, unspecified: Secondary | ICD-10-CM | POA: Diagnosis not present

## 2024-03-05 DIAGNOSIS — R102 Pelvic and perineal pain: Secondary | ICD-10-CM | POA: Insufficient documentation

## 2024-03-05 LAB — URINALYSIS, ROUTINE W REFLEX MICROSCOPIC
Bacteria, UA: NONE SEEN
Bilirubin Urine: NEGATIVE
Glucose, UA: NEGATIVE mg/dL
Ketones, ur: 20 mg/dL — AB
Nitrite: NEGATIVE
Protein, ur: >=300 mg/dL — AB
Specific Gravity, Urine: 1.018 (ref 1.005–1.030)
pH: 9 — ABNORMAL HIGH (ref 5.0–8.0)

## 2024-03-05 MED ORDER — ONDANSETRON HCL 4 MG/2ML IJ SOLN
4.0000 mg | Freq: Once | INTRAMUSCULAR | Status: DC
Start: 2024-03-05 — End: 2024-03-05

## 2024-03-05 NOTE — ED Provider Notes (Signed)
  The patient eloped prior to an evaluation, I did not evaluate this patient.       Ernie Avena, MD 03/05/24 Flossie Buffy

## 2024-03-05 NOTE — ED Triage Notes (Signed)
 Pt arrived reporting urinary retention for 1 day. Also endorses N/V.  States hx of IBS usually goes through intermittent episodes of nausea. Denies fever, chills or any other symptoms.

## 2024-03-06 ENCOUNTER — Encounter (HOSPITAL_COMMUNITY): Payer: Self-pay

## 2024-03-06 ENCOUNTER — Telehealth (HOSPITAL_COMMUNITY): Payer: Medicaid Other | Admitting: Psychiatry

## 2024-03-07 NOTE — Addendum Note (Signed)
 Addended by: Maxwell Marion E on: 03/07/2024 01:17 PM   Modules accepted: Orders

## 2024-03-07 NOTE — Telephone Encounter (Signed)
 Gina Shire, MD  You3 days ago   Ok to refer to pelvic physical therapy and can increase aygestin to 10mg  daily and see if that helps    Called patient to review recommendation to increase Aygestin. Patient declines at this time. States birth control has not been helpful in the past and Aygestin 5 mg daily has not been helpful a present. Offered pelvic PT referral-- patient would like this placed. Explained surgery scheduler and Dr. Briscoe Dorsey are working to find a surgery date and she will be contacted as soon as they have more information.

## 2024-03-08 ENCOUNTER — Other Ambulatory Visit: Payer: Self-pay

## 2024-03-08 ENCOUNTER — Telehealth: Payer: Self-pay

## 2024-03-08 ENCOUNTER — Emergency Department (HOSPITAL_COMMUNITY)
Admission: EM | Admit: 2024-03-08 | Discharge: 2024-03-08 | Disposition: A | Discharge status: Home / Self Care | Attending: Emergency Medicine | Admitting: Emergency Medicine

## 2024-03-08 DIAGNOSIS — Z91040 Latex allergy status: Secondary | ICD-10-CM | POA: Diagnosis not present

## 2024-03-08 DIAGNOSIS — R109 Unspecified abdominal pain: Secondary | ICD-10-CM

## 2024-03-08 DIAGNOSIS — R1032 Left lower quadrant pain: Secondary | ICD-10-CM | POA: Diagnosis not present

## 2024-03-08 DIAGNOSIS — R1012 Left upper quadrant pain: Secondary | ICD-10-CM | POA: Insufficient documentation

## 2024-03-08 MED ORDER — KETOROLAC TROMETHAMINE 30 MG/ML IJ SOLN
30.0000 mg | Freq: Once | INTRAMUSCULAR | Status: AC
  Administered 2024-03-08: 30 mg via INTRAMUSCULAR
  Filled 2024-03-08: qty 1

## 2024-03-08 MED ORDER — ONDANSETRON 8 MG PO TBDP
8.0000 mg | ORAL_TABLET | Freq: Once | ORAL | Status: AC
  Administered 2024-03-08: 8 mg via ORAL
  Filled 2024-03-08: qty 1

## 2024-03-08 NOTE — ED Provider Notes (Signed)
 [place] EMERGENCY DEPARTMENT AT [facility] Provider Note   CSN: [id number] Arrival date & time: 03/08/24  1017     History  Chief Complaint  Patient presents with   Abdominal Pain    Gina Dorsey is a 33 y.o. female.  33 year old female with chronic abdominal discomfort presents with left upper and left lower abdominal pain which she has had for quite some time.  Patient states that she gets this pain commonly in the morning and then gets better throughout the day.  This is no different when she is up for the past.  States that she has history of endometriosis and she is currently on her menstrual cycle.  States she threw up twice a day.  Notes that this is similar when she has had before and passed multiple times.  States that she is responsive to Toradol.  Denies any fever or chills.  No diarrhea.  Pain characterized as sharp and intermittent and waxes and wanes.  Nothing makes it better       Home Medications Prior to Admission medications   Medication Sig Start Date End Date Taking? Authorizing Provider  dicyclomine (BENTYL) 20 MG tablet Take 1 tablet (20 mg total) by mouth 2 (two) times daily. 05/10/23   Meryl Dare, MD  FLUoxetine (PROZAC) 20 MG capsule Take 1 capsule (20 mg total) by mouth daily. 12/20/23   Shanna Cisco, NP  gabapentin (NEURONTIN) 300 MG capsule Take 1 capsule (300 mg total) by mouth 3 (three) times daily. 12/20/23   Shanna Cisco, NP  hydrOXYzine (ATARAX) 25 MG tablet Take 1 tablet (25 mg total) by mouth 3 (three) times daily as needed for anxiety. 12/20/23   Shanna Cisco, NP  metroNIDAZOLE (FLAGYL) 500 MG tablet Take 1 tablet (500 mg total) by mouth 2 (two) times daily. 02/18/24   Jerene Bears, MD  norethindrone (AYGESTIN) 5 MG tablet Take 1 tablet (5 mg total) by mouth daily. 02/12/24   Lorriane Shire, MD  OLANZapine zydis (ZYPREXA) 15 MG disintegrating tablet Take 0.5 tablets (7.5 mg total) by mouth at bedtime.  12/20/23   Shanna Cisco, NP  ondansetron (ZOFRAN-ODT) 4 MG disintegrating tablet Take 1 tablet (4 mg total) by mouth every 8 (eight) hours as needed for nausea or vomiting. Patient not taking: Reported on 02/12/2024 05/22/23   Maryanna Shape A, PA-C  ondansetron (ZOFRAN-ODT) 8 MG disintegrating tablet Take 1 tablet (8 mg total) by mouth 2 (two) times daily as needed for nausea or vomiting. 05/10/23   Meryl Dare, MD  Probiotic Product (PROBIOTIC ADVANCED PO) Take 1 capsule by mouth 2 (two) times a week.    [provider]  promethazine (PROMETHEGAN) 25 MG suppository Place 1 suppository (25 mg total) rectally every 6 (six) hours as needed for nausea or vomiting. 10/03/23   Meryl Dare, MD  traZODone (DESYREL) 100 MG tablet Take 1 tablet (100 mg total) by mouth at bedtime as needed for sleep. 12/20/23   Shanna Cisco, NP  traZODone (DESYREL) 50 MG tablet TAKE 1 TABLET(50 MG) BY MOUTH AT BEDTIME AS NEEDED FOR SLEEP 02/15/24   Toy Cookey E, NP      Allergies    Bactrim [sulfamethoxazole-trimethoprim], Fish-derived products, Latex, Macrobid [nitrofurantoin], Mucinex [guaifenesin er], Reglan [metoclopramide], Shellfish-derived products, Sulfa antibiotics, Bupropion, Citalopram hydrobromide, and Morphine and codeine    Review of Systems   Review of Systems  All other systems reviewed and are negative.   Physical Exam  Updated Vital Signs LMP 02/15/2024 (Within Days)  Physical Exam Vitals and nursing note reviewed.  Constitutional:      General: She is not in acute distress.    Appearance: Normal appearance. She is well-developed. She is not toxic-appearing.  HENT:     Head: Normocephalic and atraumatic.  Eyes:     General: Lids are normal.     Conjunctiva/sclera: Conjunctivae normal.     Pupils: Pupils are equal, round, and reactive to light.  Neck:     Thyroid: No thyroid mass.     Trachea: No tracheal deviation.  Cardiovascular:     Rate and Rhythm:  Normal rate and regular rhythm.     Heart sounds: Normal heart sounds. No murmur heard.    No gallop.  Pulmonary:     Effort: Pulmonary effort is normal. No respiratory distress.     Breath sounds: Normal breath sounds. No stridor. No decreased breath sounds, wheezing, rhonchi or rales.  Abdominal:     General: There is no distension.     Palpations: Abdomen is soft.     Tenderness: There is no abdominal tenderness. There is no guarding or rebound.    Musculoskeletal:        General: No tenderness. Normal range of motion.     Cervical back: Normal range of motion and neck supple.  Skin:    General: Skin is warm and dry.     Findings: No abrasion or rash.  Neurological:     Mental Status: She is alert and oriented to person, place, and time. Mental status is at baseline.     GCS: GCS eye subscore is 4. GCS verbal subscore is 5. GCS motor subscore is 6.     Cranial Nerves: No cranial nerve deficit.     Sensory: No sensory deficit.     Motor: Motor function is intact.  Psychiatric:        Attention and Perception: Attention normal.        Speech: Speech normal.        Behavior: Behavior normal.     ED Results / Procedures / Treatments   Labs (all labs ordered are listed, but only abnormal results are displayed) Labs Reviewed - No data to display  EKG None  Radiology No results found.  Procedures Procedures    Medications Ordered in ED Medications  ketorolac (TORADOL) 30 MG/ML injection 30 mg (has no administration in time range)  ondansetron (ZOFRAN-ODT) disintegrating tablet 8 mg (has no administration in time range)    ED Course/ Medical Decision Making/ A&P                                 Medical Decision Making Risk Prescription drug management.   Patient given Toradol and Zofran for her discomfort.  When I went to reevaluate her she had eloped.        Final Clinical Impression(s) / ED Diagnoses Final diagnoses:  None    Rx / DC Orders ED  Discharge Orders     None         Lorre Nick, MD 03/08/24 1153

## 2024-03-08 NOTE — ED Triage Notes (Signed)
 Pt BIB EMS from home, c/o ongoing abdominal pain and intermittent N/V of unknown cause. Seen by GI and OB-GYN.   BP 172/118 P 86 SpO2 96%

## 2024-03-08 NOTE — Telephone Encounter (Signed)
 Called patient to provide surgery details. Patient is scheduled w/ Dr. Briscoe Deutscher on 04/18/24 at [facility] at 11 am and must arrive at 9 am. Patient voicemail is not accepting messages.

## 2024-03-10 ENCOUNTER — Emergency Department (HOSPITAL_BASED_OUTPATIENT_CLINIC_OR_DEPARTMENT_OTHER)
Admission: EM | Admit: 2024-03-10 | Discharge: 2024-03-10 | Disposition: A | Discharge status: Home / Self Care | Attending: Emergency Medicine | Admitting: Emergency Medicine

## 2024-03-10 ENCOUNTER — Other Ambulatory Visit: Payer: Self-pay

## 2024-03-10 ENCOUNTER — Encounter (HOSPITAL_BASED_OUTPATIENT_CLINIC_OR_DEPARTMENT_OTHER): Payer: Self-pay

## 2024-03-10 DIAGNOSIS — R1032 Left lower quadrant pain: Secondary | ICD-10-CM | POA: Diagnosis not present

## 2024-03-10 DIAGNOSIS — R1110 Vomiting, unspecified: Secondary | ICD-10-CM | POA: Diagnosis not present

## 2024-03-10 DIAGNOSIS — Z91040 Latex allergy status: Secondary | ICD-10-CM | POA: Insufficient documentation

## 2024-03-10 DIAGNOSIS — R300 Dysuria: Secondary | ICD-10-CM | POA: Diagnosis present

## 2024-03-10 DIAGNOSIS — R1031 Right lower quadrant pain: Secondary | ICD-10-CM | POA: Diagnosis not present

## 2024-03-10 LAB — URINALYSIS, W/ REFLEX TO CULTURE (INFECTION SUSPECTED)
Bilirubin Urine: NEGATIVE
Glucose, UA: NEGATIVE mg/dL
Ketones, ur: NEGATIVE mg/dL
Leukocytes,Ua: NEGATIVE
Nitrite: NEGATIVE
Protein, ur: NEGATIVE mg/dL
Specific Gravity, Urine: 1.009 (ref 1.005–1.030)
pH: 8 (ref 5.0–8.0)

## 2024-03-10 LAB — PREGNANCY, URINE: Preg Test, Ur: NEGATIVE

## 2024-03-10 MED ORDER — ONDANSETRON 4 MG PO TBDP
4.0000 mg | ORAL_TABLET | Freq: Once | ORAL | Status: AC
Start: 2024-03-10 — End: 2024-03-10
  Administered 2024-03-10: 4 mg via ORAL
  Filled 2024-03-10: qty 1

## 2024-03-10 MED ORDER — PHENAZOPYRIDINE HCL 200 MG PO TABS: 200.0000 mg | ORAL_TABLET | Freq: Three times a day (TID) | ORAL | 0 refills | Status: DC

## 2024-03-10 MED ORDER — ONDANSETRON 4 MG PO TBDP: 4.0000 mg | ORAL_TABLET | Freq: Three times a day (TID) | ORAL | 0 refills | Status: DC | PRN

## 2024-03-10 MED ORDER — PHENAZOPYRIDINE HCL 100 MG PO TABS
200.0000 mg | ORAL_TABLET | Freq: Once | ORAL | Status: AC
  Administered 2024-03-10: 200 mg via ORAL
  Filled 2024-03-10: qty 2

## 2024-03-10 MED ORDER — KETOROLAC TROMETHAMINE 15 MG/ML IJ SOLN
15.0000 mg | Freq: Once | INTRAMUSCULAR | Status: AC
Start: 2024-03-10 — End: 2024-03-10
  Administered 2024-03-10: 15 mg via INTRAMUSCULAR
  Filled 2024-03-10: qty 1

## 2024-03-10 NOTE — Discharge Instructions (Signed)
 Please read and follow all provided instructions.  Your diagnoses today include:  1. Dysuria     Tests performed today include: Urine test -does not suggest that you have an infection in your bladder Pregnancy test -negative Vital signs. See below for your results today.   Medications prescribed:  Pyridium - medication for bladder irriation  This medication will turn your urine orange. This is normal.   Zofran (ondansetron) - for nausea and vomiting  Home care instructions:  Follow any educational materials contained in this packet.  Follow-up instructions: Please follow-up with your primary care provider in 5 days if symptoms are not resolved for further evaluation of your symptoms.  Return instructions:  Please return to the Emergency Department if you experience worsening symptoms.  Return with fever, worsening pain, persistent vomiting, worsening pain in your back.  Please return if you have any other emergent concerns.  Additional Information:  Your vital signs today were: BP (!) 175/105 (BP Location: Right Arm)   Pulse 72   Temp 98.4 F (36.9 C) (Oral)   Resp 18   Ht 5\' 4"  (1.626 m)   Wt 68 kg   LMP 03/10/2024 (Within Days)   SpO2 100%   BMI 25.75 kg/m  If your blood pressure (BP) was elevated above 135/85 this visit, please have this repeated by your doctor within one month. --------------

## 2024-03-10 NOTE — ED Provider Notes (Signed)
 [place] EMERGENCY DEPARTMENT AT [facility] Provider Note   CSN: [id number] Arrival date & time: 03/10/24  1209     History  Chief Complaint  Patient presents with   Urinary Tract Infection    Gina Dorsey is a 33 y.o. female.  Patient with history of chronic pelvic pain, followed by OB/GYN, currently scheduled for exploratory laparotomy in early June --presents to the emergency department for concern for "urinary tract infection".  She reports burning with urination and stinging pain, worse with the urge to urinate.  She feels that this is different than her chronic pelvic pain.  She reports vomiting at times, last episode just prior to arrival.  No fevers.  No chest pain or shortness of breath.  UA on 4/8 was not suggestive of infection.  Patient reports current menstrual period and is having vaginal bleeding.  No significant discharge.       Home Medications Prior to Admission medications   Medication Sig Start Date End Date Taking? Authorizing Provider  dicyclomine (BENTYL) 20 MG tablet Take 1 tablet (20 mg total) by mouth 2 (two) times daily. 05/10/23   Asencion Blacksmith, MD  FLUoxetine (PROZAC) 20 MG capsule Take 1 capsule (20 mg total) by mouth daily. 12/20/23   Arlyne Bering, NP  gabapentin (NEURONTIN) 300 MG capsule Take 1 capsule (300 mg total) by mouth 3 (three) times daily. 12/20/23   Parsons, Brittney E, NP  hydrOXYzine (ATARAX) 25 MG tablet Take 1 tablet (25 mg total) by mouth 3 (three) times daily as needed for anxiety. 12/20/23   Parsons, Brittney E, NP  metroNIDAZOLE (FLAGYL) 500 MG tablet Take 1 tablet (500 mg total) by mouth 2 (two) times daily. 02/18/24   Lillian Rein, MD  norethindrone (AYGESTIN) 5 MG tablet Take 1 tablet (5 mg total) by mouth daily. 02/12/24   Ajewole, Christana, MD  OLANZapine zydis (ZYPREXA) 15 MG disintegrating tablet Take 0.5 tablets (7.5 mg total) by mouth at bedtime. 12/20/23   Parsons, Brittney E, NP  ondansetron  (ZOFRAN-ODT) 4 MG disintegrating tablet Take 1 tablet (4 mg total) by mouth every 8 (eight) hours as needed for nausea or vomiting. Patient not taking: Reported on 02/12/2024 05/22/23   Zelaya, Oscar A, PA-C  ondansetron (ZOFRAN-ODT) 8 MG disintegrating tablet Take 1 tablet (8 mg total) by mouth 2 (two) times daily as needed for nausea or vomiting. 05/10/23   Asencion Blacksmith, MD  Probiotic Product (PROBIOTIC ADVANCED PO) Take 1 capsule by mouth 2 (two) times a week.    [provider]  promethazine (PROMETHEGAN) 25 MG suppository Place 1 suppository (25 mg total) rectally every 6 (six) hours as needed for nausea or vomiting. 10/03/23   Asencion Blacksmith, MD  traZODone (DESYREL) 100 MG tablet Take 1 tablet (100 mg total) by mouth at bedtime as needed for sleep. 12/20/23   Arlyne Bering, NP  traZODone (DESYREL) 50 MG tablet TAKE 1 TABLET(50 MG) BY MOUTH AT BEDTIME AS NEEDED FOR SLEEP 02/15/24   Ardena Koyanagi E, NP      Allergies    Bactrim [sulfamethoxazole-trimethoprim], Fish-derived products, Latex, Macrobid [nitrofurantoin], Mucinex [guaifenesin er], Reglan [metoclopramide], Shellfish-derived products, Sulfa antibiotics, Bupropion, Citalopram hydrobromide, and Morphine and codeine    Review of Systems   Review of Systems  Physical Exam Updated Vital Signs BP (!) 175/105 (BP Location: Right Arm)   Pulse 72   Temp 98.4 F (36.9 C) (Oral)   Resp 18   Ht 5\' 4"  (1.626  m)   Wt 68 kg   LMP 03/10/2024 (Within Days)   SpO2 100%   BMI 25.75 kg/m   Physical Exam Vitals and nursing note reviewed.  Constitutional:      Appearance: She is well-developed.  HENT:     Head: Normocephalic and atraumatic.     Mouth/Throat:     Mouth: Mucous membranes are moist.  Eyes:     Conjunctiva/sclera: Conjunctivae normal.  Pulmonary:     Effort: No respiratory distress.  Abdominal:     Tenderness: There is abdominal tenderness in the right lower quadrant, suprapubic area and left lower  quadrant. There is no guarding or rebound. Negative signs include Murphy's sign and McBurney's sign.  Musculoskeletal:     Cervical back: Normal range of motion and neck supple.  Skin:    General: Skin is warm and dry.  Neurological:     Mental Status: She is alert.     ED Results / Procedures / Treatments   Labs (all labs ordered are listed, but only abnormal results are displayed) Labs Reviewed  URINALYSIS, W/ REFLEX TO CULTURE (INFECTION SUSPECTED) - Abnormal; Notable for the following components:      Result Value   Hgb urine dipstick MODERATE (*)    Bacteria, UA RARE (*)    All other components within normal limits  PREGNANCY, URINE    EKG None  Radiology No results found.  Procedures Procedures    Medications Ordered in ED Medications - No data to display  ED Course/ Medical Decision Making/ A&P    Patient seen and examined. History obtained directly from patient.   Labs/EKG: Ordered UA, urine pregnancy.  Imaging: None ordered  Medications/Fluids: None ordered  Most recent vital signs reviewed and are as follows: BP (!) 175/105 (BP Location: Right Arm)   Pulse 72   Temp 98.4 F (36.9 C) (Oral)   Resp 18   Ht 5\' 4"  (1.626 m)   Wt 68 kg   LMP 03/10/2024 (Within Days)   SpO2 100%   BMI 25.75 kg/m   Initial impression: Chronic pain, irritative bladder symptoms.  12:38 PM Reassessment performed. Patient appears stable.  Family member now at bedside.  Labs personally reviewed and interpreted including: UA with dipstick positive for hemoglobin otherwise no infectious concerns.  Pregnancy negative.  Reviewed pertinent lab work and imaging with patient at bedside. Questions answered.   Most current vital signs reviewed and are as follows: BP (!) 175/105 (BP Location: Right Arm)   Pulse 72   Temp 98.4 F (36.9 C) (Oral)   Resp 18   Ht 5\' 4"  (1.626 m)   Wt 68 kg   LMP 03/10/2024 (Within Days)   SpO2 100%   BMI 25.75 kg/m   Plan: Discharge to  home.  Will give IM Toradol here with Zofran and Pyridium.  Prescriptions written for: Pyridium and Zofran  Other home care instructions discussed: Hydration  ED return instructions discussed: The patient was urged to return to the Emergency Department immediately with worsening of current symptoms, worsening abdominal pain, persistent vomiting, blood noted in stools, fever, or any other concerns. The patient verbalized understanding.   Follow-up instructions discussed: Patient encouraged to follow-up with their PCP in 5 days.                                 Medical Decision Making Amount and/or Complexity of Data Reviewed Labs: ordered.   Patient  with irritative bladder symptoms on top of chronic pelvic pain.  She is on her menstrual period currently.  UA not overly compelling for infection.  Presentation not consistent with pyelonephritis.  No rebound or guarding on abdomen.  No focal lower abdominal pain.  Low concern for appendicitis, colitis, diverticulitis.  Treatment plan as above.  Patient looks well, nontoxic.         Final Clinical Impression(s) / ED Diagnoses Final diagnoses:  Dysuria    Rx / DC Orders ED Discharge Orders          Ordered    phenazopyridine (PYRIDIUM) 200 MG tablet  3 times daily        03/10/24 1237    ondansetron (ZOFRAN-ODT) 4 MG disintegrating tablet  Every 8 hours PRN        03/10/24 1237              Lyna Sandhoff, PA-C 03/10/24 1240    Almond Army, MD 03/10/24 1428

## 2024-03-11 ENCOUNTER — Telehealth: Payer: Self-pay

## 2024-03-11 NOTE — Telephone Encounter (Signed)
 Patient called to confirm her 04/29/24 surgery date w/ Dr Elester Grim. Patient received pre-op instructions and the surgery details via Mychart.

## 2024-03-13 ENCOUNTER — Telehealth (INDEPENDENT_AMBULATORY_CARE_PROVIDER_SITE_OTHER): Admitting: Psychiatry

## 2024-03-13 ENCOUNTER — Encounter (HOSPITAL_COMMUNITY): Payer: Self-pay | Admitting: Psychiatry

## 2024-03-13 DIAGNOSIS — F411 Generalized anxiety disorder: Secondary | ICD-10-CM

## 2024-03-13 DIAGNOSIS — F603 Borderline personality disorder: Secondary | ICD-10-CM

## 2024-03-13 MED ORDER — OLANZAPINE 15 MG PO TBDP: 7.5000 mg | ORAL_TABLET | Freq: Every day | ORAL | 3 refills | Status: DC

## 2024-03-13 MED ORDER — HYDROXYZINE HCL 25 MG PO TABS: 25.0000 mg | ORAL_TABLET | Freq: Three times a day (TID) | ORAL | 3 refills | Status: DC | PRN

## 2024-03-13 MED ORDER — TRAZODONE HCL 100 MG PO TABS: 100.0000 mg | ORAL_TABLET | Freq: Every evening | ORAL | 3 refills | Status: DC | PRN

## 2024-03-13 MED ORDER — FLUOXETINE HCL 20 MG PO CAPS: 20.0000 mg | ORAL_CAPSULE | Freq: Every day | ORAL | 3 refills | Status: DC

## 2024-03-13 MED ORDER — GABAPENTIN 300 MG PO CAPS: 300.0000 mg | ORAL_CAPSULE | Freq: Three times a day (TID) | ORAL | 3 refills | Status: DC
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
## 2024-04-18 ENCOUNTER — Inpatient Hospital Stay (HOSPITAL_COMMUNITY): Admit: 2024-04-18 | Admitting: Obstetrics and Gynecology

## 2024-04-18 SURGERY — LAPAROSCOPY, DIAGNOSTIC
Anesthesia: Choice

## 2024-04-23 NOTE — Pre-Procedure Instructions (Signed)
 Surgical Instructions   Your procedure is scheduled on April 29, 2024. Report to [facility] Main Entrance "A" at 8:55 A.M., then check in with the Admitting office. Any questions or running late day of surgery: call [phone]  Questions prior to your surgery date: call [phone], Monday-Friday, 8am-4pm. If you experience any cold or flu symptoms such as cough, fever, chills, shortness of breath, etc. between now and your scheduled surgery, please notify us  at the above number.     Remember:  Do not eat after midnight the night before your surgery   You may drink clear liquids until 7:55 AM the morning of your surgery.   Clear liquids allowed are: Water , Non-Citrus Juices (without pulp), Carbonated Beverages, Clear Tea (no milk, honey, etc.), Black Coffee Only (NO MILK, CREAM OR POWDERED CREAMER of any kind), and Gatorade.    Take these medicines the morning of surgery with A SIP OF WATER : dicyclomine  (BENTYL )  FLUoxetine  (PROZAC )  gabapentin  (NEURONTIN )  hydrOXYzine  (ATARAX )    May take these medicines IF NEEDED: promethazine  (PROMETHEGAN)    One week prior to surgery, STOP taking any Aspirin (unless otherwise instructed by your surgeon) Aleve , Naproxen , Ibuprofen , Motrin , Advil , Goody's, BC's, all herbal medications, fish oil, and non-prescription vitamins.                     Do NOT Smoke (Tobacco/Vaping) for 24 hours prior to your procedure.  If you use a CPAP at night, you may bring your mask/headgear for your overnight stay.   You will be asked to remove any contacts, glasses, piercing's, hearing aid's, dentures/partials prior to surgery. Please bring cases for these items if needed.    Patients discharged the day of surgery will not be allowed to drive home, and someone needs to stay with them for 24 hours.  SURGICAL WAITING ROOM VISITATION Patients may have no more than 2 support people in the waiting area - these visitors may rotate.   Pre-op nurse will  coordinate an appropriate time for 1 ADULT support person, who may not rotate, to accompany patient in pre-op.  Children under the age of 20 must have an adult with them who is not the patient and must remain in the main waiting area with an adult.  If the patient needs to stay at the hospital during part of their recovery, the visitor guidelines for inpatient rooms apply.  Please refer to the [facility] website for the visitor guidelines for any additional information.   If you received a COVID test during your pre-op visit  it is requested that you wear a mask when out in public, stay away from anyone that may not be feeling well and notify your surgeon if you develop symptoms. If you have been in contact with anyone that has tested positive in the last 10 days please notify you surgeon.      Pre-operative CHG Bathing Instructions   You can play a key role in reducing the risk of infection after surgery. Your skin needs to be as free of germs as possible. You can reduce the number of germs on your skin by washing with CHG (chlorhexidine gluconate) soap before surgery. CHG is an antiseptic soap that kills germs and continues to kill germs even after washing.   DO NOT use if you have an allergy to chlorhexidine/CHG or antibacterial soaps. If your skin becomes reddened or irritated, stop using the CHG and notify one of our RNs at [phone].  TAKE A SHOWER THE NIGHT BEFORE SURGERY AND THE DAY OF SURGERY    Please keep in mind the following:  DO NOT shave, including legs and underarms, 48 hours prior to surgery.   You may shave your face before/day of surgery.  Place clean sheets on your bed the night before surgery Use a clean washcloth (not used since being washed) for each shower. DO NOT sleep with pet's night before surgery.  CHG Shower Instructions:  Wash your face and private area with normal soap. If you choose to wash your hair, wash first with your normal shampoo.   After you use shampoo/soap, rinse your hair and body thoroughly to remove shampoo/soap residue.  Turn the water  OFF and apply half the bottle of CHG soap to a CLEAN washcloth.  Apply CHG soap ONLY FROM YOUR NECK DOWN TO YOUR TOES (washing for 3-5 minutes)  DO NOT use CHG soap on face, private areas, open wounds, or sores.  Pay special attention to the area where your surgery is being performed.  If you are having back surgery, having someone wash your back for you may be helpful. Wait 2 minutes after CHG soap is applied, then you may rinse off the CHG soap.  Pat dry with a clean towel  Put on clean pajamas    Additional instructions for the day of surgery: DO NOT APPLY any lotions, deodorants, cologne, or perfumes.   Do not wear jewelry or makeup Do not wear nail polish, gel polish, artificial nails, or any other type of covering on natural nails (fingers and toes) Do not bring valuables to the hospital. [facility] is not responsible for valuables/personal belongings. Put on clean/comfortable clothes.  Please brush your teeth.  Ask your nurse before applying any prescription medications to the skin.

## 2024-04-24 ENCOUNTER — Encounter (HOSPITAL_COMMUNITY)
Admission: RE | Admit: 2024-04-24 | Discharge: 2024-04-24 | Disposition: A | Discharge status: Home / Self Care | Source: Ambulatory Visit | Attending: Obstetrics and Gynecology | Admitting: Obstetrics and Gynecology

## 2024-04-24 ENCOUNTER — Encounter (HOSPITAL_COMMUNITY): Payer: Self-pay

## 2024-04-24 ENCOUNTER — Other Ambulatory Visit: Payer: Self-pay

## 2024-04-24 VITALS — BP 125/88 | HR 66 | Temp 98.4°F | Resp 16 | Ht 64.0 in | Wt 178.0 lb

## 2024-04-24 DIAGNOSIS — R1030 Lower abdominal pain, unspecified: Secondary | ICD-10-CM | POA: Insufficient documentation

## 2024-04-24 DIAGNOSIS — Z01812 Encounter for preprocedural laboratory examination: Secondary | ICD-10-CM | POA: Diagnosis present

## 2024-04-24 DIAGNOSIS — Z01818 Encounter for other preprocedural examination: Secondary | ICD-10-CM

## 2024-04-24 LAB — CBC
HCT: 40.3 % (ref 36.0–46.0)
Hemoglobin: 13.2 g/dL (ref 12.0–15.0)
MCH: 29.7 pg (ref 26.0–34.0)
MCHC: 32.8 g/dL (ref 30.0–36.0)
MCV: 90.8 fL (ref 80.0–100.0)
Platelets: 259 10*3/uL (ref 150–400)
RBC: 4.44 MIL/uL (ref 3.87–5.11)
RDW: 13.4 % (ref 11.5–15.5)
WBC: 7 10*3/uL (ref 4.0–10.5)
nRBC: 0 % (ref 0.0–0.2)

## 2024-04-24 LAB — TYPE AND SCREEN
ABO/RH(D): O POS
Antibody Screen: NEGATIVE

## 2024-04-24 NOTE — Progress Notes (Signed)
 PCP - denies Cardiologist - denies  PPM/ICD - denies   Chest x-ray - 02/09/23 EKG - 07/29/23 Stress Test - denies ECHO - denies Cardiac Cath - denies  Sleep Study - denies   DM- denies  Last dose of GLP1 agonist-  n/a   ASA/Blood Thinner Instructions: n/a   ERAS Protcol - clears until 0755   COVID TEST- n/a   Anesthesia review: no  Patient denies shortness of breath, fever, cough and chest pain at PAT appointment   All instructions explained to the patient, with a verbal understanding of the material. Patient agrees to go over the instructions while at home for a better understanding.  The opportunity to ask questions was provided.

## 2024-04-29 ENCOUNTER — Other Ambulatory Visit (HOSPITAL_COMMUNITY): Payer: Self-pay

## 2024-04-29 ENCOUNTER — Ambulatory Visit (HOSPITAL_COMMUNITY)

## 2024-04-29 ENCOUNTER — Encounter (HOSPITAL_COMMUNITY): Payer: Self-pay | Admitting: Obstetrics and Gynecology

## 2024-04-29 ENCOUNTER — Other Ambulatory Visit: Payer: Self-pay

## 2024-04-29 ENCOUNTER — Encounter (HOSPITAL_COMMUNITY)
Admission: RE | Disposition: A | Discharge status: Home / Self Care | Payer: Self-pay | Source: Home / Self Care | Attending: Obstetrics and Gynecology

## 2024-04-29 ENCOUNTER — Ambulatory Visit (HOSPITAL_COMMUNITY)
Admission: RE | Admit: 2024-04-29 | Discharge: 2024-04-29 | Disposition: A | Discharge status: Home / Self Care | Attending: Obstetrics and Gynecology | Admitting: Obstetrics and Gynecology

## 2024-04-29 DIAGNOSIS — N736 Female pelvic peritoneal adhesions (postinfective): Secondary | ICD-10-CM

## 2024-04-29 DIAGNOSIS — N944 Primary dysmenorrhea: Secondary | ICD-10-CM

## 2024-04-29 DIAGNOSIS — N80A61 Endometriosis of right ureter, unspecified depth: Secondary | ICD-10-CM | POA: Diagnosis not present

## 2024-04-29 DIAGNOSIS — N946 Dysmenorrhea, unspecified: Secondary | ICD-10-CM

## 2024-04-29 DIAGNOSIS — N8301 Follicular cyst of right ovary: Secondary | ICD-10-CM | POA: Insufficient documentation

## 2024-04-29 DIAGNOSIS — R102 Pelvic and perineal pain unspecified side: Secondary | ICD-10-CM

## 2024-04-29 DIAGNOSIS — G8929 Other chronic pain: Secondary | ICD-10-CM | POA: Insufficient documentation

## 2024-04-29 DIAGNOSIS — R1030 Lower abdominal pain, unspecified: Secondary | ICD-10-CM

## 2024-04-29 DIAGNOSIS — Z01818 Encounter for other preprocedural examination: Secondary | ICD-10-CM

## 2024-04-29 DIAGNOSIS — N80102 Endometriosis of left ovary, unspecified depth: Secondary | ICD-10-CM | POA: Insufficient documentation

## 2024-04-29 HISTORY — PX: HYSTEROSCOPY WITH D & C: SHX1775

## 2024-04-29 HISTORY — PX: LAPAROSCOPY: SHX197

## 2024-04-29 LAB — ABO/RH: ABO/RH(D): O POS

## 2024-04-29 LAB — POCT PREGNANCY, URINE: Preg Test, Ur: NEGATIVE

## 2024-04-29 SURGERY — LAPAROSCOPY, DIAGNOSTIC
Anesthesia: General | Site: Vagina

## 2024-04-29 MED ORDER — PROPOFOL 10 MG/ML IV BOLUS
INTRAVENOUS | Status: AC
  Filled 2024-04-29: qty 20

## 2024-04-29 MED ORDER — LACTATED RINGERS IV SOLN: INTRAVENOUS | Status: DC

## 2024-04-29 MED ORDER — DEXMEDETOMIDINE HCL IN NACL 80 MCG/20ML IV SOLN
INTRAVENOUS | Status: DC | PRN
  Administered 2024-04-29 (×2): 20 ug via INTRAVENOUS

## 2024-04-29 MED ORDER — OXYCODONE HCL 5 MG PO TABS
5.0000 mg | ORAL_TABLET | Freq: Four times a day (QID) | ORAL | 0 refills | Status: DC | PRN
  Filled 2024-04-29: qty 6, 2d supply, fill #0

## 2024-04-29 MED ORDER — MIDAZOLAM HCL 2 MG/2ML IJ SOLN
INTRAMUSCULAR | Status: DC | PRN
  Administered 2024-04-29: 2 mg via INTRAVENOUS

## 2024-04-29 MED ORDER — CHLORHEXIDINE GLUCONATE 0.12 % MT SOLN
15.0000 mL | Freq: Once | OROMUCOSAL | Status: AC
  Administered 2024-04-29: 15 mL via OROMUCOSAL
  Filled 2024-04-29: qty 15

## 2024-04-29 MED ORDER — ORAL CARE MOUTH RINSE
15.0000 mL | Freq: Once | OROMUCOSAL | Status: AC
Start: 2024-04-29 — End: 2024-04-29

## 2024-04-29 MED ORDER — HYDROMORPHONE HCL 1 MG/ML IJ SOLN
0.2500 mg | INTRAMUSCULAR | Status: DC | PRN
  Administered 2024-04-29 (×2): 0.5 mg via INTRAVENOUS

## 2024-04-29 MED ORDER — ROCURONIUM BROMIDE 10 MG/ML (PF) SYRINGE
PREFILLED_SYRINGE | INTRAVENOUS | Status: AC
  Filled 2024-04-29: qty 10

## 2024-04-29 MED ORDER — KETOROLAC TROMETHAMINE 30 MG/ML IJ SOLN
30.0000 mg | Freq: Once | INTRAMUSCULAR | Status: AC | PRN
  Administered 2024-04-29: 30 mg via INTRAVENOUS

## 2024-04-29 MED ORDER — SCOPOLAMINE 1 MG/3DAYS TD PT72
1.0000 | MEDICATED_PATCH | TRANSDERMAL | Status: DC
  Administered 2024-04-29: 1.5 mg via TRANSDERMAL
  Filled 2024-04-29: qty 1

## 2024-04-29 MED ORDER — FENTANYL CITRATE (PF) 250 MCG/5ML IJ SOLN
INTRAMUSCULAR | Status: DC | PRN
Start: 2024-04-29 — End: 2024-04-29
  Administered 2024-04-29 (×2): 50 ug via INTRAVENOUS

## 2024-04-29 MED ORDER — ALBUTEROL SULFATE HFA 108 (90 BASE) MCG/ACT IN AERS
INHALATION_SPRAY | RESPIRATORY_TRACT | Status: DC | PRN
  Administered 2024-04-29: 2 via RESPIRATORY_TRACT

## 2024-04-29 MED ORDER — MUPIROCIN 2 % EX OINT: 1.0000 | TOPICAL_OINTMENT | Freq: Once | CUTANEOUS | Status: AC

## 2024-04-29 MED ORDER — DEXMEDETOMIDINE HCL IN NACL 80 MCG/20ML IV SOLN
INTRAVENOUS | Status: AC
Start: 2024-04-29 — End: ?
  Filled 2024-04-29: qty 20

## 2024-04-29 MED ORDER — HYDROMORPHONE HCL 1 MG/ML IJ SOLN
INTRAMUSCULAR | Status: AC
  Filled 2024-04-29: qty 0.5

## 2024-04-29 MED ORDER — OXYCODONE HCL 5 MG/5ML PO SOLN: 5.0000 mg | Freq: Once | ORAL | Status: AC | PRN

## 2024-04-29 MED ORDER — ONDANSETRON HCL 4 MG/2ML IJ SOLN
INTRAMUSCULAR | Status: AC
Start: 2024-04-29 — End: ?
  Filled 2024-04-29: qty 2

## 2024-04-29 MED ORDER — KETAMINE HCL 10 MG/ML IJ SOLN
INTRAMUSCULAR | Status: DC | PRN
  Administered 2024-04-29: 40 mg via INTRAVENOUS

## 2024-04-29 MED ORDER — POVIDONE-IODINE 10 % EX SWAB: 2.0000 | Freq: Once | CUTANEOUS | Status: DC

## 2024-04-29 MED ORDER — ONDANSETRON HCL 4 MG/2ML IJ SOLN: 4.0000 mg | Freq: Once | INTRAMUSCULAR | Status: DC | PRN

## 2024-04-29 MED ORDER — DEXAMETHASONE SODIUM PHOSPHATE 10 MG/ML IJ SOLN
INTRAMUSCULAR | Status: DC | PRN
  Administered 2024-04-29: 10 mg via INTRAVENOUS

## 2024-04-29 MED ORDER — LIDOCAINE 2% (20 MG/ML) 5 ML SYRINGE
INTRAMUSCULAR | Status: AC
Start: 2024-04-29 — End: ?
  Filled 2024-04-29: qty 5

## 2024-04-29 MED ORDER — ROCURONIUM BROMIDE 10 MG/ML (PF) SYRINGE
PREFILLED_SYRINGE | INTRAVENOUS | Status: DC | PRN
  Administered 2024-04-29: 60 mg via INTRAVENOUS

## 2024-04-29 MED ORDER — BUPIVACAINE HCL (PF) 0.5 % IJ SOLN
INTRAMUSCULAR | Status: DC | PRN
  Administered 2024-04-29: 10 mL
  Administered 2024-04-29: 16 mL

## 2024-04-29 MED ORDER — HYDROMORPHONE HCL 1 MG/ML IJ SOLN
INTRAMUSCULAR | Status: AC
  Filled 2024-04-29: qty 1

## 2024-04-29 MED ORDER — OXYCODONE HCL 5 MG PO TABS
5.0000 mg | ORAL_TABLET | Freq: Once | ORAL | Status: AC | PRN
  Administered 2024-04-29: 5 mg via ORAL

## 2024-04-29 MED ORDER — MIDAZOLAM HCL 2 MG/2ML IJ SOLN
INTRAMUSCULAR | Status: AC
Start: 2024-04-29 — End: ?
  Filled 2024-04-29: qty 2

## 2024-04-29 MED ORDER — 0.9 % SODIUM CHLORIDE (POUR BTL) OPTIME
TOPICAL | Status: DC | PRN
  Administered 2024-04-29: 1000 mL

## 2024-04-29 MED ORDER — PROPOFOL 10 MG/ML IV BOLUS
INTRAVENOUS | Status: DC | PRN
  Administered 2024-04-29: 100 mg via INTRAVENOUS
  Administered 2024-04-29: 50 mg via INTRAVENOUS
  Administered 2024-04-29: 200 mg via INTRAVENOUS
  Administered 2024-04-29: 50 mg via INTRAVENOUS

## 2024-04-29 MED ORDER — DEXAMETHASONE SODIUM PHOSPHATE 10 MG/ML IJ SOLN
INTRAMUSCULAR | Status: AC
Start: 2024-04-29 — End: ?
  Filled 2024-04-29: qty 1

## 2024-04-29 MED ORDER — MEPERIDINE HCL 25 MG/ML IJ SOLN: 6.2500 mg | INTRAMUSCULAR | Status: DC | PRN

## 2024-04-29 MED ORDER — HYDROMORPHONE HCL 1 MG/ML IJ SOLN
INTRAMUSCULAR | Status: DC | PRN
  Administered 2024-04-29 (×2): .5 mg via INTRAVENOUS

## 2024-04-29 MED ORDER — PHENYLEPHRINE 80 MCG/ML (10ML) SYRINGE FOR IV PUSH (FOR BLOOD PRESSURE SUPPORT)
PREFILLED_SYRINGE | INTRAVENOUS | Status: DC | PRN
  Administered 2024-04-29: 40 ug via INTRAVENOUS

## 2024-04-29 MED ORDER — KETAMINE HCL 50 MG/5ML IJ SOSY
PREFILLED_SYRINGE | INTRAMUSCULAR | Status: AC
  Filled 2024-04-29: qty 5

## 2024-04-29 MED ORDER — KETOROLAC TROMETHAMINE 30 MG/ML IJ SOLN
INTRAMUSCULAR | Status: AC
  Filled 2024-04-29: qty 1

## 2024-04-29 MED ORDER — ESMOLOL HCL 100 MG/10ML IV SOLN
INTRAVENOUS | Status: AC
Start: 2024-04-29 — End: ?
  Filled 2024-04-29: qty 10

## 2024-04-29 MED ORDER — PHENYLEPHRINE 80 MCG/ML (10ML) SYRINGE FOR IV PUSH (FOR BLOOD PRESSURE SUPPORT)
PREFILLED_SYRINGE | INTRAVENOUS | Status: AC
Start: 2024-04-29 — End: ?
  Filled 2024-04-29: qty 10

## 2024-04-29 MED ORDER — OXYCODONE HCL 5 MG PO TABS
ORAL_TABLET | ORAL | Status: AC
  Filled 2024-04-29: qty 1

## 2024-04-29 MED ORDER — BUPIVACAINE HCL (PF) 0.5 % IJ SOLN
INTRAMUSCULAR | Status: AC
  Filled 2024-04-29: qty 30

## 2024-04-29 MED ORDER — ACETAMINOPHEN 500 MG PO TABS
1000.0000 mg | ORAL_TABLET | ORAL | Status: AC
  Administered 2024-04-29: 1000 mg via ORAL
  Filled 2024-04-29: qty 2

## 2024-04-29 MED ORDER — HYDROMORPHONE HCL 1 MG/ML IJ SOLN: 0.2500 mg | INTRAMUSCULAR | Status: DC | PRN

## 2024-04-29 MED ORDER — ESMOLOL HCL 100 MG/10ML IV SOLN
INTRAVENOUS | Status: DC | PRN
  Administered 2024-04-29: 20 mg via INTRAVENOUS

## 2024-04-29 MED ORDER — GABAPENTIN 300 MG PO CAPS
300.0000 mg | ORAL_CAPSULE | ORAL | Status: DC
  Filled 2024-04-29: qty 1

## 2024-04-29 MED ORDER — FENTANYL CITRATE (PF) 100 MCG/2ML IJ SOLN
INTRAMUSCULAR | Status: AC
  Filled 2024-04-29: qty 2

## 2024-04-29 MED ORDER — ACETAMINOPHEN 500 MG PO TABS: 1000.0000 mg | ORAL_TABLET | Freq: Once | ORAL | Status: DC

## 2024-04-29 MED ORDER — MUPIROCIN 2 % EX OINT
TOPICAL_OINTMENT | CUTANEOUS | Status: AC
  Administered 2024-04-29: 1 via TOPICAL
  Filled 2024-04-29: qty 22

## 2024-04-29 MED ORDER — LIDOCAINE 2% (20 MG/ML) 5 ML SYRINGE
INTRAMUSCULAR | Status: DC | PRN
  Administered 2024-04-29: 100 mg via INTRAVENOUS

## 2024-04-29 MED ORDER — AMISULPRIDE (ANTIEMETIC) 5 MG/2ML IV SOLN: 10.0000 mg | Freq: Once | INTRAVENOUS | Status: DC | PRN

## 2024-04-29 MED ORDER — SUGAMMADEX SODIUM 200 MG/2ML IV SOLN
INTRAVENOUS | Status: DC | PRN
  Administered 2024-04-29: 200 mg via INTRAVENOUS

## 2024-04-29 MED ORDER — ALBUTEROL SULFATE HFA 108 (90 BASE) MCG/ACT IN AERS
INHALATION_SPRAY | RESPIRATORY_TRACT | Status: AC
  Filled 2024-04-29: qty 6.7

## 2024-04-29 MED ORDER — HYDROMORPHONE HCL 1 MG/ML IJ SOLN
INTRAMUSCULAR | Status: AC
Start: 2024-04-29 — End: ?
  Filled 2024-04-29: qty 0.5

## 2024-04-29 MED ORDER — SODIUM CHLORIDE (PF) 0.9 % IJ SOLN
INTRAMUSCULAR | Status: AC
Start: 2024-04-29 — End: ?
  Filled 2024-04-29: qty 10

## 2024-04-29 SURGICAL SUPPLY — 34 items
CABLE HIGH FREQUENCY MONO STRZ (ELECTRODE) IMPLANT
CHLORAPREP W/TINT 26 (MISCELLANEOUS) IMPLANT
COVER MAYO STAND STRL (DRAPES) ×3 IMPLANT
DERMABOND ADVANCED .7 DNX12 (GAUZE/BANDAGES/DRESSINGS) ×3 IMPLANT
DRAPE SURG IRRIG POUCH 19X23 (DRAPES) ×3 IMPLANT
DRSG TELFA 3X8 NADH STRL (GAUZE/BANDAGES/DRESSINGS) IMPLANT
DURAPREP 26ML APPLICATOR (WOUND CARE) ×3 IMPLANT
ELECTRODE REM PT RTRN 9FT ADLT (ELECTROSURGICAL) ×3 IMPLANT
GLOVE BIOGEL PI IND STRL 7.0 (GLOVE) ×12 IMPLANT
GLOVE BIOGEL PI IND STRL 7.5 (GLOVE) IMPLANT
GLOVE ECLIPSE 7.0 STRL STRAW (GLOVE) ×3 IMPLANT
GLOVE SURG SS PI 6.0 STRL IVOR (GLOVE) IMPLANT
GLOVE SURG SS PI 7.0 STRL IVOR (GLOVE) IMPLANT
GLOVE SURG SS PI 7.5 STRL IVOR (GLOVE) IMPLANT
GOWN STRL REUS W/ TWL LRG LVL3 (GOWN DISPOSABLE) ×3 IMPLANT
GOWN STRL REUS W/ TWL XL LVL3 (GOWN DISPOSABLE) ×6 IMPLANT
HIBICLENS CHG 4% 4OZ (MISCELLANEOUS) IMPLANT
KIT PINK PAD W/HEAD ARE REST (MISCELLANEOUS) ×2 IMPLANT
KIT PINK PAD W/HEAD ARM REST (MISCELLANEOUS) ×3 IMPLANT
KIT TURNOVER KIT B (KITS) ×3 IMPLANT
NS IRRIG 1000ML POUR BTL (IV SOLUTION) ×3 IMPLANT
PACK LAPAROSCOPY BASIN (CUSTOM PROCEDURE TRAY) ×3 IMPLANT
PACK VAGINAL MINOR WOMEN LF (CUSTOM PROCEDURE TRAY) ×3 IMPLANT
PAD OB MATERNITY 11 LF (PERSONAL CARE ITEMS) ×3 IMPLANT
POUCH LAPAROSCOPIC INSTRUMENT (MISCELLANEOUS) ×3 IMPLANT
SCISSORS LAP 5X35 DISP (ENDOMECHANICALS) IMPLANT
SET TUBE SMOKE EVAC HIGH FLOW (TUBING) ×3 IMPLANT
SLEEVE SCD COMPRESS KNEE MED (STOCKING) ×3 IMPLANT
SLEEVE Z-THREAD 5X100MM (TROCAR) ×6 IMPLANT
SUT VIC AB 4-0 PS2 18 (SUTURE) ×3 IMPLANT
SYR 50ML LL SCALE MARK (SYRINGE) IMPLANT
TRAY FOLEY W/BAG SLVR 14FR LF (SET/KITS/TRAYS/PACK) ×3 IMPLANT
TROCAR Z-THREAD FIOS 5X100MM (TROCAR) ×3 IMPLANT
WARMER LAPAROSCOPE (MISCELLANEOUS) ×3 IMPLANT

## 2024-04-29 NOTE — Brief Op Note (Signed)
 04/29/2024  11:10 AM  PATIENT:  Lorin Room  33 y.o. female  PRE-OPERATIVE DIAGNOSIS:  Dysmenorrhea Pelvic Pain  POST-OPERATIVE DIAGNOSIS:  DysmenorrheaPelvic Pain  PROCEDURE:  Procedure(s): LAPAROSCOPY, DIAGNOSTIC/ PERITONEAL BIOPSIES/ LYSIS OF ADHESIONS (N/A) DILATATION AND CURETTAGE (N/A)  SURGEON:  Surgeons and Role:    Kiki Pelton, MD - Primary  PHYSICIAN ASSISTANT: Kirstie Percy, PA   ANESTHESIA:   local, general, and paracervical block  EBL:  10 mL   BLOOD ADMINISTERED:none  DRAINS: none   LOCAL MEDICATIONS USED:  BUPIVICAINE   SPECIMEN:  Source of Specimen:  endometrial curettings, biopsies from: right tue, posterior cul de sac, right ovary, left ovarian fossa, left pelvic brim, right upper abdominal wall adhesion, right supra-ureteral area   DISPOSITION OF SPECIMEN:  PATHOLOGY  COUNTS:  YES  TOURNIQUET:  * No tourniquets in log *  DICTATION: .Note written in EPIC  PLAN OF CARE: Discharge to home after PACU  PATIENT DISPOSITION:  PACU - hemodynamically stable.   Delay start of Pharmacological VTE agent (>24hrs) due to surgical blood loss or risk of bleeding: not applicable

## 2024-04-29 NOTE — Op Note (Signed)
 [facility] PROCEDURE DATE: 04/29/2024  PREOPERATIVE DIAGNOSIS: chronic pelvic pain, dysmenorrhea  POSTOPERATIVE DIAGNOSIS: chronic pelvic pain, dysmenorrhea PROCEDURE:    diagnostic laparoscopy with peritoneal biopsies, lysis of adhesions, dilation and curettage  SURGEON: Kiki Pelton, MD ASSISTANT:  Kirstie Percy, PA  An experienced assistant was required given the standard of surgical care given the complexity of the case.  This assistant was needed for exposure, dissection, suctioning, retraction, instrument exchange, and for overall help during the procedure.  INDICATIONS: 33 y.o. G0P0000 with chronic pelvic pain and dysmenorrhea.  Risks of surgery were discussed with the patient including but not limited to: bleeding which may require transfusion; infection which may require antibiotics; injury to surrounding organs; need for additional procedures including laparotomy;  and other postoperative/anesthesia complications. Written informed consent was obtained.    FINDINGS:  Normal external genitalia, normal appearing cervix. Laparoscopically: normal appearing liver and gallbladder as well as stomach edge, normal appearing appendix, filmy adhesions of bowel to the anterior abdominal wall on the right, normal sized uterus without abnormal contours, normal bilateral fallopian tubes, grossly normal ovaries with 1mm brown spot on right ovary, normal bilateral ureters seen, normal anterior cul de sac, small bleb in posterior cul de sac, small white lesion in right side wall superior to the ureter, small brown lesion in left ovarian fossa, small brown lesion on right side wall by IP   ANESTHESIA:    General, local, paracervical block INTRAVENOUS FLUIDS: 1300 ml ESTIMATED BLOOD LOSS: 10 ml URINE OUTPUT: 100 ml SPECIMENS: endometrial curettings, biopsies from: right tue, posterior cul de sac, right ovary, left ovarian fossa, left pelvic brim, right upper abdominal wall adhesion, right  supra-ureteral area  COMPLICATIONS: None immediate  PROCEDURE IN DETAIL:  The patient had sequential compression devices applied to her lower extremities while in the preoperative area.  She was then taken to the operating room where general anesthesia was administered and was found to be adequate.  She was placed in the dorsal lithotomy position, and was prepped and draped in a sterile manner.  A Foley catheter was inserted into her bladder and attached to constant drainage and a hulka uterine manipulator was then advanced into the uterus .  After an adequate timeout was performed, attention was turned to the abdomen where an umbilical incision was made with the scalpel.  The Optiview 8-mm trocar and sleeve were then advanced without difficulty with the laparoscope under direct visualization into the abdomen.  The abdomen was then insufflated with carbon dioxide gas and adequate pneumoperitoneum was obtained.   A detailed survey of the patient's pelvis and abdomen revealed the findings as mentioned above. The peritoneum was elevated and removed sharply with scissor for each biopsy with care to not injure underlying structures from the following area: the peritoneum close to the right IP, a bleb from the posterior cul de sac, the surface of the right ovary, the left ovarian fossa, right posterior cul de sac superior to the ureter, and the left pelvic side wall at the level of the pelvic brim. There was a broad, and predominantly filmy, bowel adhesion in the right upper quadrant to the anterior abdominal wall that was taken down sharply. A part of the adhesion was removed sharply and sent for pathology.  The operative site was surveyed, and it was found to be hemostatic.  No intraoperative injury to surrounding organs was noted.  The abdomen was desufflated and all instruments were then removed from the patient's abdomen. The uterine manipulator was removed  and a tenaculum placed on the anterior lip of the cervix.  A paracervical block was administered using 10cc of 0.5% bupivicaine. The cervix was serially dilated and curettage of all 4 quadrants of the endometrial cavity were completed without complications.  All incisions were closed with 4-0 vicryl and Dermabond. The patient tolerated the procedures well.  All instruments, needles, and sponge counts were correct x 2. The patient was taken to the recovery room in stable condition.   Kiki Pelton, MD Minimally Invasive Gynecologic Surgery  Obstetrics and Gynecology, [facility] for [facility], [facility] 04/29/2024

## 2024-04-29 NOTE — Anesthesia Postprocedure Evaluation (Signed)
 Anesthesia Post Note  Patient: Gina Dorsey  Procedure(s) Performed: LAPAROSCOPY, DIAGNOSTIC/ PERITONEAL BIOPSIES/ LYSIS OF ADHESIONS (Pelvis) DILATATION AND CURETTAGE (Vagina )     Patient location during evaluation: PACU Anesthesia Type: General Level of consciousness: awake and alert, oriented and patient cooperative Pain management: pain level controlled Vital Signs Assessment: post-procedure vital signs reviewed and stable Respiratory status: spontaneous breathing, nonlabored ventilation and respiratory function stable Cardiovascular status: blood pressure returned to baseline and stable Postop Assessment: no apparent nausea or vomiting Anesthetic complications: no   No notable events documented.  Last Vitals:  Vitals:   04/29/24 1145 04/29/24 1200  BP: 138/74 118/74  Pulse: (!) 55 (!) 59  Resp: 16 18  Temp:    SpO2: 94% 92%    Last Pain:  Vitals:   04/29/24 1133  PainSc: [address] [place]

## 2024-04-29 NOTE — Anesthesia Preprocedure Evaluation (Addendum)
 Anesthesia Evaluation  Patient identified by MRN, date of birth, ID band Patient awake    Reviewed: Allergy & Precautions, H&P , NPO status , Patient's Chart, lab work & pertinent test results  Airway Mallampati: III  TM Distance: >3 FB Neck ROM: Full    Dental no notable dental hx. (+) Teeth Intact, Dental Advisory Given   Pulmonary Current Smoker and Patient abstained from smoking. 1 ppd   Pulmonary exam normal breath sounds clear to auscultation       Cardiovascular negative cardio ROS Normal cardiovascular exam Rhythm:Regular Rate:Normal     Neuro/Psych  PSYCHIATRIC DISORDERS Anxiety Depression Bipolar Disorder   negative neurological ROS     GI/Hepatic ,,,(+)     substance abuse (denies any recent use)  Chronic nausea- takes zofran  1-2x per week, last dose 8AM   Endo/Other  BMI 30  Renal/GU negative Renal ROS  negative genitourinary   Musculoskeletal negative musculoskeletal ROS (+)  narcotic dependent  Abdominal   Peds negative pediatric ROS (+)  Hematology negative hematology ROS (+) Hb 13.2   Anesthesia Other Findings   Reproductive/Obstetrics negative OB ROS                             Anesthesia Physical Anesthesia Plan  ASA: 3  Anesthesia Plan: General   Post-op Pain Management: Tylenol  PO (pre-op)*   Induction: Intravenous  PONV Risk Score and Plan: 3 and Ondansetron , Dexamethasone , Midazolam, Treatment may vary due to age or medical condition and Scopolamine patch - Pre-op  Airway Management Planned: Oral ETT  Additional Equipment: None  Intra-op Plan:   Post-operative Plan: Extubation in OR  Informed Consent: I have reviewed the patients History and Physical, chart, labs and discussed the procedure including the risks, benefits and alternatives for the proposed anesthesia with the patient or authorized representative who has indicated his/her understanding  and acceptance.     Dental advisory given  Plan Discussed with: CRNA  Anesthesia Plan Comments:        Anesthesia Quick Evaluation

## 2024-04-29 NOTE — Discharge Instructions (Addendum)
 Post-surgical Instructions, Outpatient Surgery  You may expect to feel dizzy, weak, and drowsy for as long as 24 hours after receiving the medicine that made you sleep (anesthetic). For the first 24 hours after your surgery:   Do not drive a car, ride a bicycle, participate in physical activities, or take public transportation until you are done taking narcotic pain medicines or as directed by Dr. Elester Grim.  Do not drink alcohol or take tranquilizers.  Do not take medicine that has not been prescribed by your physicians.  Do not sign important papers or make important decisions while on narcotic pain medicines.  Have a responsible person with you.   CARE OF INCISION If you have a bandage, you may remove it in one day.  If there are steri-strips or dermabond, just let this loosen on its own.  You may shower on the first day after your surgery.  Do not sit in a tub bath for one week. Avoid heavy lifting (more than 10 pounds/4.5 kilograms), pushing, or pulling.  Avoid activities that may risk injury to your incisions.   PAIN MANAGEMENT Ibuprofen  800mg .  (This is the same as 4-200mg  over the counter tablets of Motrin  or ibuprofen .)  Take this every 6 hours or as needed for cramping.   Acetaminophen  1000mg  (This is the same as 2-500mg  over the counter extra strength tylenol ). Take this every 6 hours for the first 3 days or as needed afterwards for pain Gabapentin  300mg  up to three times a day. For more severe pain, take one tablet every four to eight hours as needed for pain control.   Oxycodone 5mg  every 6 hours as needed for more severe pain. Do not take this with gabapentin .   DO'S AND DON'T'S Do not take a tub bath for 2 weeks.  You may shower on the first day after your surgery Do not do any heavy lifting for one to two weeks.  This increases the chance of bleeding. Do move around as you feel able.  Stairs are fine.  You may begin to exercise again as you feel able.  Do not lift any weights  for two weeks. Do not put anything in the vagina for two weeks--no tampons, intercourse, or douching.    REGULAR MEDIATIONS/VITAMINS: You may restart all of your regular medications as prescribed. You may restart all of your vitamins as you normally take them.    PLEASE CALL OR SEEK MEDICAL CARE IF: You have persistent nausea and vomiting.  You have trouble eating or drinking.  You have an oral temperature above 100.5.  You have constipation that is not helped by adjusting diet or increasing fluid intake. Pain medicines are a common cause of constipation.  You have heavy vaginal bleeding You have redness or drainage from your incision(s) or there is increasing pain or tenderness near or in the surgical site.

## 2024-04-29 NOTE — Anesthesia Procedure Notes (Signed)
 Procedure Name: Intubation Date/Time: 04/29/2024 10:00 AM  Performed by: Trenton Frock, CRNAPre-anesthesia Checklist: Patient identified, Emergency Drugs available, Suction available and Patient being monitored Patient Re-evaluated:Patient Re-evaluated prior to induction Oxygen Delivery Method: Circle system utilized Preoxygenation: Pre-oxygenation with 100% oxygen Induction Type: IV induction Ventilation: Mask ventilation without difficulty Laryngoscope Size: Mac and 3 Grade View: Grade I Tube type: Oral Number of attempts: 1 Airway Equipment and Method: Stylet and Bite block Placement Confirmation: ETT inserted through vocal cords under direct vision, positive ETCO2 and breath sounds checked- equal and bilateral Secured at: 21 cm Tube secured with: Tape Dental Injury: Teeth and Oropharynx as per pre-operative assessment

## 2024-04-29 NOTE — Transfer of Care (Signed)
 Immediate Anesthesia Transfer of Care Note  Patient: Gina Dorsey  Procedure(s) Performed: LAPAROSCOPY, DIAGNOSTIC/ PERITONEAL BIOPSIES/ LYSIS OF ADHESIONS (Pelvis) DILATATION AND CURETTAGE (Vagina )  Patient Location: PACU  Anesthesia Type:General  Level of Consciousness: awake, alert , oriented, and patient cooperative  Airway & Oxygen Therapy: Patient Spontanous Breathing and Patient connected to nasal cannula oxygen  Post-op Assessment: Report given to RN, Post -op Vital signs reviewed and stable, Patient moving all extremities X 4, and Patient able to stick tongue midline  Post vital signs: Reviewed and stable  Last Vitals:  Vitals Value Taken Time  BP 160/99   Temp 36.4 C 04/29/24 1133  Pulse 69 04/29/24 1133  Resp 12 04/29/24 1133  SpO2 96 % 04/29/24 1133  Vitals shown include unfiled device data.  Last Pain:  Vitals:   04/29/24 0916  PainSc: 0-No pain         Complications: No notable events documented. Patient naturally very anxious and moving around. A&OX3
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
## 2024-04-30 ENCOUNTER — Encounter (HOSPITAL_COMMUNITY): Payer: Self-pay | Admitting: Obstetrics and Gynecology

## 2024-05-01 ENCOUNTER — Ambulatory Visit: Payer: Self-pay | Admitting: Obstetrics and Gynecology

## 2024-05-01 DIAGNOSIS — G8918 Other acute postprocedural pain: Secondary | ICD-10-CM

## 2024-05-01 LAB — SURGICAL PATHOLOGY

## 2024-05-01 MED ORDER — CLONIDINE HCL 0.1 MG PO TABS: 0.1000 mg | ORAL_TABLET | Freq: Three times a day (TID) | ORAL | 0 refills | Status: DC | PRN

## 2024-05-08 ENCOUNTER — Telehealth (INDEPENDENT_AMBULATORY_CARE_PROVIDER_SITE_OTHER): Admitting: Psychiatry

## 2024-05-08 ENCOUNTER — Encounter (HOSPITAL_COMMUNITY): Payer: Self-pay | Admitting: Psychiatry

## 2024-05-08 DIAGNOSIS — F411 Generalized anxiety disorder: Secondary | ICD-10-CM

## 2024-05-08 DIAGNOSIS — F603 Borderline personality disorder: Secondary | ICD-10-CM

## 2024-05-08 MED ORDER — GABAPENTIN 300 MG PO CAPS: 300.0000 mg | ORAL_CAPSULE | Freq: Three times a day (TID) | ORAL | 3 refills | Status: DC

## 2024-05-08 MED ORDER — HYDROXYZINE HCL 25 MG PO TABS: 25.0000 mg | ORAL_TABLET | Freq: Three times a day (TID) | ORAL | 3 refills | Status: DC | PRN

## 2024-05-08 MED ORDER — TRAZODONE HCL 100 MG PO TABS
100.0000 mg | ORAL_TABLET | Freq: Every evening | ORAL | 3 refills | Status: DC | PRN
Start: 2024-05-08 — End: 2024-08-23

## 2024-05-08 MED ORDER — FLUOXETINE HCL 20 MG PO CAPS
20.0000 mg | ORAL_CAPSULE | Freq: Every day | ORAL | 3 refills | Status: DC
Start: 2024-05-08 — End: 2024-08-23

## 2024-05-08 MED ORDER — OLANZAPINE 15 MG PO TBDP: 7.5000 mg | ORAL_TABLET | Freq: Every day | ORAL | 3 refills | Status: DC
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
## 2024-05-16 ENCOUNTER — Ambulatory Visit: Attending: Obstetrics and Gynecology | Admitting: Physical Therapy

## 2024-05-16 ENCOUNTER — Other Ambulatory Visit: Payer: Self-pay

## 2024-05-16 DIAGNOSIS — R293 Abnormal posture: Secondary | ICD-10-CM | POA: Diagnosis present

## 2024-05-16 DIAGNOSIS — R279 Unspecified lack of coordination: Secondary | ICD-10-CM | POA: Insufficient documentation

## 2024-05-16 DIAGNOSIS — R102 Pelvic and perineal pain: Secondary | ICD-10-CM | POA: Diagnosis not present

## 2024-05-16 DIAGNOSIS — G8929 Other chronic pain: Secondary | ICD-10-CM | POA: Diagnosis not present

## 2024-05-16 DIAGNOSIS — M6281 Muscle weakness (generalized): Secondary | ICD-10-CM | POA: Insufficient documentation

## 2024-05-16 DIAGNOSIS — M62838 Other muscle spasm: Secondary | ICD-10-CM | POA: Insufficient documentation

## 2024-05-16 DIAGNOSIS — N946 Dysmenorrhea, unspecified: Secondary | ICD-10-CM | POA: Insufficient documentation
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
## 2024-05-19 ENCOUNTER — Other Ambulatory Visit: Payer: Self-pay

## 2024-05-19 ENCOUNTER — Emergency Department (HOSPITAL_COMMUNITY)
Admission: EM | Admit: 2024-05-19 | Discharge: 2024-05-19 | Discharge status: Against Medical Advice | Attending: Emergency Medicine | Admitting: Emergency Medicine

## 2024-05-19 DIAGNOSIS — R112 Nausea with vomiting, unspecified: Secondary | ICD-10-CM | POA: Insufficient documentation

## 2024-05-19 DIAGNOSIS — Z5321 Procedure and treatment not carried out due to patient leaving prior to being seen by health care provider: Secondary | ICD-10-CM | POA: Insufficient documentation

## 2024-05-19 DIAGNOSIS — R102 Pelvic and perineal pain: Secondary | ICD-10-CM | POA: Diagnosis present

## 2024-05-19 DIAGNOSIS — R109 Unspecified abdominal pain: Secondary | ICD-10-CM | POA: Insufficient documentation

## 2024-05-19 DIAGNOSIS — G8929 Other chronic pain: Secondary | ICD-10-CM | POA: Insufficient documentation

## 2024-05-19 DIAGNOSIS — R1030 Lower abdominal pain, unspecified: Secondary | ICD-10-CM

## 2024-05-19 NOTE — ED Provider Notes (Signed)
  33 year old female with a history of bipolar disorder, anxiety, OCD, history of opiate abuse, recent diagnostic laparoscopy with dilation and curettage for dysmenorrhea and chronic pelvic pain on June 2, found to have endometriosis who presents with concern for abdominal pain, nausea and vomiting.  Reviewed chart and ordered labs to begin process of evaluation for emergent etiologies of abdominal pain. She declined labs.  When I went to evaluate her, she had just left the department.    Dreama Longs, MD 05/19/24 1020

## 2024-05-19 NOTE — ED Notes (Addendum)
 Patient aware that provider was on the way but has decided to walk out stating that she wanted to leave. I asked patient would she be able to sign an AMA form and she did not respond to me and kept walking. Pt Aox4 and ambulatory upon departure. Dr. Dreama and charge nurse notified and aware.

## 2024-05-19 NOTE — ED Triage Notes (Addendum)
 Pt presents to the ED with complaints of abdominal pain and N/V x4 days. Pt states that she was here recently for the same thing and has not had any relief since. She took zofran  2 hrs ago with no relief as well. Aox4  EMS Vitals:  BP160/100 HR 98 Cbg 120 O2 97%

## 2024-05-19 NOTE — ED Notes (Addendum)
 Patient walked out of room and stated that she would like her IV taken out. She states she couldn't sit here and wait any longer with the IV in place if she wasn't going to be getting any pain meds. Myself as well as the charge nurse reassured her that it would be beneficial to wait for the provider to see her and then determine whether or not she would need the IV removed. She insisted that she would like the IV taken out regardless. Dr. Dreama notified and aware and stated that she is coming to see the patient soon.

## 2024-05-19 NOTE — ED Notes (Signed)
 Patient refusing bloodwork , states that she had blood work done the last time she was here and does not need it again.

## 2024-05-30 ENCOUNTER — Telehealth (INDEPENDENT_AMBULATORY_CARE_PROVIDER_SITE_OTHER): Payer: MEDICAID | Admitting: Obstetrics and Gynecology

## 2024-05-30 DIAGNOSIS — N946 Dysmenorrhea, unspecified: Secondary | ICD-10-CM

## 2024-05-30 DIAGNOSIS — N809 Endometriosis, unspecified: Secondary | ICD-10-CM
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
## 2024-06-05 ENCOUNTER — Ambulatory Visit: Payer: MEDICAID | Attending: Obstetrics and Gynecology | Admitting: Physical Therapy

## 2024-06-05 DIAGNOSIS — R293 Abnormal posture: Secondary | ICD-10-CM | POA: Insufficient documentation

## 2024-06-05 DIAGNOSIS — R279 Unspecified lack of coordination: Secondary | ICD-10-CM | POA: Insufficient documentation

## 2024-06-05 DIAGNOSIS — M6281 Muscle weakness (generalized): Secondary | ICD-10-CM | POA: Insufficient documentation

## 2024-06-05 DIAGNOSIS — M62838 Other muscle spasm: Secondary | ICD-10-CM | POA: Insufficient documentation

## 2024-06-12 ENCOUNTER — Ambulatory Visit: Payer: MEDICAID | Admitting: Physical Therapy

## 2024-06-12 DIAGNOSIS — M6281 Muscle weakness (generalized): Secondary | ICD-10-CM

## 2024-06-12 DIAGNOSIS — R293 Abnormal posture: Secondary | ICD-10-CM | POA: Diagnosis present

## 2024-06-12 DIAGNOSIS — M62838 Other muscle spasm: Secondary | ICD-10-CM

## 2024-06-12 DIAGNOSIS — R279 Unspecified lack of coordination: Secondary | ICD-10-CM | POA: Diagnosis present

## 2024-06-12 NOTE — Patient Instructions (Signed)
About Abdominal Massage  Abdominal massage, also called external colon massage, is a self-treatment circular massage technique that can reduce and eliminate gas and ease constipation. The colon naturally contracts in waves in a clockwise direction starting from inside the right hip, moving up toward the ribs, across the belly, and down inside the left hip.  When you perform circular abdominal massage, you help stimulate your colon's normal wave pattern of movement called peristalsis.  It is most beneficial when done after eating.  Positioning You can practice abdominal massage with oil while lying down, or in the shower with soap.  Some people find that it is just as effective to do the massage through clothing while sitting or standing.  How to Massage Start by placing your finger tips or knuckles on your right side, just inside your hip bone.  . Make small circular movements while you move upward toward your rib cage.   . Once you reach the bottom right side of your rib cage, take your circular movements across to the left side of the bottom of your rib cage.  . Next, move downward until you reach the inside of your left hip bone.  This is the path your feces travel in your colon. . Continue to perform your abdominal massage in this pattern for 10 minutes each day.     You can apply as much pressure as is comfortable in your massage.  Start gently and build pressure as you continue to practice.  Notice any areas of pain as you massage; areas of slight pain may be relieved as you massage, but if you have areas of significant or intense pain, consult with your healthcare provider.  Other Considerations . General physical activity including bending and stretching can have a beneficial massage-like effect on the colon.  Deep breathing can also stimulate the colon because breathing deeply activates the same nervous system that supplies the colon.   . Abdominal massage should always be used in  combination with a bowel-conscious diet that is high in the proper type of fiber for you, fluids (primarily water), and a regular exercise program.  
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
## 2024-06-17 MED ORDER — ORIAHNN 300-1-0.5 & 300 MG PO CPPK: 1.0000 | ORAL_CAPSULE | Freq: Two times a day (BID) | ORAL | 6 refills | Status: DC

## 2024-06-17 MED ORDER — ORILISSA 150 MG PO TABS: 1.0000 | ORAL_TABLET | Freq: Every day | ORAL | 6 refills | Status: DC

## 2024-06-18 ENCOUNTER — Other Ambulatory Visit: Payer: Self-pay | Admitting: Obstetrics and Gynecology

## 2024-06-18 DIAGNOSIS — G8918 Other acute postprocedural pain: Secondary | ICD-10-CM

## 2024-06-19 ENCOUNTER — Telehealth: Payer: Self-pay | Admitting: Family Medicine

## 2024-06-19 NOTE — Telephone Encounter (Signed)
-----   Message from Carter Quarry sent at 06/17/2024 11:38 AM EDT ----- Regarding: medicaid consent Hey,  When able, remind patient needs to come in to sign hysterectomy consent form.  Thanks,  Ajewole

## 2024-06-19 NOTE — Telephone Encounter (Signed)
 Called patient to remind her to come into the office to fill out the hysterectomy consent form. Patient voiced understanding.

## 2024-06-25 ENCOUNTER — Ambulatory Visit: Payer: MEDICAID | Admitting: Physical Therapy

## 2024-06-25 DIAGNOSIS — R279 Unspecified lack of coordination: Secondary | ICD-10-CM

## 2024-06-25 DIAGNOSIS — M6281 Muscle weakness (generalized): Secondary | ICD-10-CM | POA: Diagnosis not present

## 2024-06-25 DIAGNOSIS — R293 Abnormal posture: Secondary | ICD-10-CM

## 2024-06-25 DIAGNOSIS — M62838 Other muscle spasm: Secondary | ICD-10-CM
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
## 2024-07-04 ENCOUNTER — Ambulatory Visit: Payer: MEDICAID | Attending: Obstetrics and Gynecology | Admitting: Physical Therapy

## 2024-07-04 DIAGNOSIS — M6281 Muscle weakness (generalized): Secondary | ICD-10-CM | POA: Diagnosis present

## 2024-07-04 DIAGNOSIS — R279 Unspecified lack of coordination: Secondary | ICD-10-CM | POA: Insufficient documentation

## 2024-07-04 DIAGNOSIS — M62838 Other muscle spasm: Secondary | ICD-10-CM | POA: Insufficient documentation
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
## 2024-07-11 ENCOUNTER — Ambulatory Visit: Payer: MEDICAID | Admitting: Physical Therapy

## 2024-07-11 DIAGNOSIS — M62838 Other muscle spasm: Secondary | ICD-10-CM

## 2024-07-11 DIAGNOSIS — M6281 Muscle weakness (generalized): Secondary | ICD-10-CM | POA: Diagnosis not present

## 2024-07-11 DIAGNOSIS — R279 Unspecified lack of coordination: Secondary | ICD-10-CM
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
## 2024-07-15 ENCOUNTER — Ambulatory Visit: Payer: MEDICAID | Admitting: Physical Therapy

## 2024-07-15 DIAGNOSIS — M6281 Muscle weakness (generalized): Secondary | ICD-10-CM

## 2024-07-15 DIAGNOSIS — M62838 Other muscle spasm: Secondary | ICD-10-CM
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
## 2024-07-17 ENCOUNTER — Encounter: Admitting: Physical Therapy

## 2024-07-24 ENCOUNTER — Ambulatory Visit: Payer: MEDICAID | Admitting: Physical Therapy

## 2024-07-29 ENCOUNTER — Encounter (HOSPITAL_COMMUNITY): Payer: Self-pay | Admitting: Emergency Medicine

## 2024-07-29 ENCOUNTER — Emergency Department (HOSPITAL_COMMUNITY)
Admission: EM | Admit: 2024-07-29 | Discharge: 2024-07-29 | Discharge status: Against Medical Advice | Payer: MEDICAID | Attending: Emergency Medicine | Admitting: Emergency Medicine

## 2024-07-29 ENCOUNTER — Other Ambulatory Visit: Payer: Self-pay

## 2024-07-29 DIAGNOSIS — R1030 Lower abdominal pain, unspecified: Secondary | ICD-10-CM | POA: Insufficient documentation

## 2024-07-29 DIAGNOSIS — Z5321 Procedure and treatment not carried out due to patient leaving prior to being seen by health care provider: Secondary | ICD-10-CM | POA: Diagnosis not present

## 2024-07-29 DIAGNOSIS — R112 Nausea with vomiting, unspecified: Secondary | ICD-10-CM | POA: Diagnosis not present

## 2024-07-29 NOTE — ED Notes (Signed)
 Writer went into patient's room to draw blood off of IV for lab testing. Pt proceeded to yell at writer stating it always comes back negative, I have endometriosis! Writer attempted to explain to patient that testing was needed to narrow down what was going on tonight. Pt continued to [employer] manager and stated to just take my IV out. Writer confirmed that patient wanted her IV out and didn't want labs done. Pt then walked out of ED.

## 2024-07-29 NOTE — ED Triage Notes (Signed)
 Pt presents to the ED via [facility] with complaints of lower abdominal pain with associated N/V all day. Pt with intermittent pain - received 100mcg of fentanyl  and 4mg  zofran  PTA with EMS with no improvement. Pt very boisterous in triage - security present to help deescalate the patient. A&Ox4 at this time. Denies CP or SOB.

## 2024-07-30 ENCOUNTER — Emergency Department (HOSPITAL_COMMUNITY)
Admission: EM | Admit: 2024-07-30 | Discharge: 2024-07-30 | Disposition: A | Discharge status: Home / Self Care | Payer: MEDICAID | Attending: Emergency Medicine | Admitting: Emergency Medicine

## 2024-07-30 ENCOUNTER — Telehealth: Payer: Self-pay

## 2024-07-30 ENCOUNTER — Emergency Department (HOSPITAL_COMMUNITY): Payer: MEDICAID

## 2024-07-30 ENCOUNTER — Encounter (HOSPITAL_COMMUNITY): Payer: Self-pay

## 2024-07-30 ENCOUNTER — Other Ambulatory Visit: Payer: Self-pay

## 2024-07-30 DIAGNOSIS — E876 Hypokalemia: Secondary | ICD-10-CM | POA: Insufficient documentation

## 2024-07-30 DIAGNOSIS — R739 Hyperglycemia, unspecified: Secondary | ICD-10-CM | POA: Insufficient documentation

## 2024-07-30 DIAGNOSIS — N809 Endometriosis, unspecified: Secondary | ICD-10-CM | POA: Diagnosis not present

## 2024-07-30 DIAGNOSIS — D72829 Elevated white blood cell count, unspecified: Secondary | ICD-10-CM | POA: Insufficient documentation

## 2024-07-30 DIAGNOSIS — R102 Pelvic and perineal pain: Secondary | ICD-10-CM

## 2024-07-30 DIAGNOSIS — Z91040 Latex allergy status: Secondary | ICD-10-CM | POA: Diagnosis not present

## 2024-07-30 LAB — URINALYSIS, W/ REFLEX TO CULTURE (INFECTION SUSPECTED)
Bacteria, UA: NONE SEEN
Bilirubin Urine: NEGATIVE
Glucose, UA: NEGATIVE mg/dL
Hgb urine dipstick: NEGATIVE
Ketones, ur: 20 mg/dL — AB
Leukocytes,Ua: NEGATIVE
Nitrite: NEGATIVE
Protein, ur: 100 mg/dL — AB
Specific Gravity, Urine: 1.017 (ref 1.005–1.030)
pH: 9 — ABNORMAL HIGH (ref 5.0–8.0)

## 2024-07-30 LAB — COMPREHENSIVE METABOLIC PANEL WITH GFR
ALT: 9 U/L (ref 0–44)
AST: 18 U/L (ref 15–41)
Albumin: 5.2 g/dL — ABNORMAL HIGH (ref 3.5–5.0)
Alkaline Phosphatase: 122 U/L (ref 38–126)
Anion gap: 17 — ABNORMAL HIGH (ref 5–15)
BUN: 10 mg/dL (ref 6–20)
CO2: 30 mmol/L (ref 22–32)
Calcium: 10.8 mg/dL — ABNORMAL HIGH (ref 8.9–10.3)
Chloride: 98 mmol/L (ref 98–111)
Creatinine, Ser: 0.91 mg/dL (ref 0.44–1.00)
GFR, Estimated: >60 mL/min (ref >60)
Glucose, Bld: 148 mg/dL — ABNORMAL HIGH (ref 70–99)
Potassium: 3.2 mmol/L — ABNORMAL LOW (ref 3.5–5.1)
Sodium: 144 mmol/L (ref 135–145)
Total Bilirubin: 0.4 mg/dL (ref 0.0–1.2)
Total Protein: 8.6 g/dL — ABNORMAL HIGH (ref 6.5–8.1)

## 2024-07-30 LAB — CBC WITH DIFFERENTIAL/PLATELET
Abs Immature Granulocytes: 0.03 K/uL (ref 0.00–0.07)
Basophils Absolute: 0 K/uL (ref 0.0–0.1)
Basophils Relative: 0 %
Eosinophils Absolute: 0 K/uL (ref 0.0–0.5)
Eosinophils Relative: 0 %
HCT: 44.9 % (ref 36.0–46.0)
Hemoglobin: 15.1 g/dL — ABNORMAL HIGH (ref 12.0–15.0)
Immature Granulocytes: 0 %
Lymphocytes Relative: 9 %
Lymphs Abs: 1.1 K/uL (ref 0.7–4.0)
MCH: 29.5 pg (ref 26.0–34.0)
MCHC: 33.6 g/dL (ref 30.0–36.0)
MCV: 87.9 fL (ref 80.0–100.0)
Monocytes Absolute: 0.4 K/uL (ref 0.1–1.0)
Monocytes Relative: 3 %
Neutro Abs: 10.5 K/uL — ABNORMAL HIGH (ref 1.7–7.7)
Neutrophils Relative %: 88 %
Platelets: 345 K/uL (ref 150–400)
RBC: 5.11 MIL/uL (ref 3.87–5.11)
RDW: 13.1 % (ref 11.5–15.5)
WBC: 12 K/uL — ABNORMAL HIGH (ref 4.0–10.5)
nRBC: 0 % (ref 0.0–0.2)

## 2024-07-30 LAB — LIPASE, BLOOD: Lipase: 15 U/L (ref 11–51)

## 2024-07-30 LAB — WET PREP, GENITAL
Clue Cells Wet Prep HPF POC: NONE SEEN
Sperm: NONE SEEN
Trich, Wet Prep: NONE SEEN
WBC, Wet Prep HPF POC: <10 (ref <10)
Yeast Wet Prep HPF POC: NONE SEEN

## 2024-07-30 LAB — HIV ANTIBODY (ROUTINE TESTING W REFLEX): HIV Screen 4th Generation wRfx: NONREACTIVE

## 2024-07-30 LAB — RPR: RPR Ser Ql: NONREACTIVE

## 2024-07-30 LAB — HCG, SERUM, QUALITATIVE: Preg, Serum: NEGATIVE

## 2024-07-30 MED ORDER — MORPHINE SULFATE (PF) 4 MG/ML IV SOLN
4.0000 mg | Freq: Once | INTRAVENOUS | Status: AC
  Administered 2024-07-30: 4 mg via INTRAVENOUS
  Filled 2024-07-30: qty 1

## 2024-07-30 MED ORDER — ONDANSETRON 8 MG PO TBDP: 8.0000 mg | ORAL_TABLET | Freq: Three times a day (TID) | ORAL | 0 refills | Status: DC | PRN

## 2024-07-30 MED ORDER — SODIUM CHLORIDE 0.9 % IV BOLUS
1000.0000 mL | Freq: Once | INTRAVENOUS | Status: AC
  Administered 2024-07-30: 1000 mL via INTRAVENOUS

## 2024-07-30 MED ORDER — ONDANSETRON HCL 4 MG/2ML IJ SOLN
4.0000 mg | Freq: Once | INTRAMUSCULAR | Status: AC
  Administered 2024-07-30: 4 mg via INTRAVENOUS
  Filled 2024-07-30: qty 2

## 2024-07-30 MED ORDER — SODIUM CHLORIDE 0.9 % IV SOLN
25.0000 mg | Freq: Once | INTRAVENOUS | Status: AC
  Administered 2024-07-30: 25 mg via INTRAVENOUS
  Filled 2024-07-30: qty 1

## 2024-07-30 MED ORDER — ALUM & MAG HYDROXIDE-SIMETH 200-200-20 MG/5ML PO SUSP
30.0000 mL | Freq: Once | ORAL | Status: AC
  Administered 2024-07-30: 30 mL via ORAL
  Filled 2024-07-30: qty 30

## 2024-07-30 MED ORDER — PROCHLORPERAZINE EDISYLATE 10 MG/2ML IJ SOLN
10.0000 mg | Freq: Once | INTRAMUSCULAR | Status: AC
  Administered 2024-07-30: 10 mg via INTRAVENOUS
  Filled 2024-07-30: qty 2

## 2024-07-30 NOTE — ED Provider Notes (Signed)
 Care transferred to me.  Patient states her pain is better controlled and she feels well enough for discharge.  She is requesting Zofran  8 mg ODT prescription which has helped her in the past.  To her, this feels like an exacerbation of her endometriosis/chronic pain.  She does not feel like there is anything different today from baseline.  Will discharge home with return precautions.   Freddi Hamilton, MD 07/30/24 [phone]

## 2024-07-30 NOTE — Telephone Encounter (Signed)
 I called the patient to see if she's available for surgery w/ Dr. Jeralyn on 09/24/24 at [facility]. I left a voicemail asking patient to return my call to schedule surgery.

## 2024-07-30 NOTE — ED Provider Notes (Signed)
 [place] EMERGENCY DEPARTMENT AT [facility] Provider Note   CSN: [id number] Arrival date & time: 07/30/24  9677     Patient presents with: Abdominal Pain   Gina Dorsey is a 33 y.o. female.   The history is provided by the patient.  Abdominal Pain  She has history of endometriosis, obsessive-compulsive disorder, posttraumatic stress disorder, bipolar disorder and comes in because of severe left sided pelvic pain which has been present all day.  Pain is like her endometriosis pain but worse.  There has been associated nausea and vomiting.  She had been in the emergency department earlier today, but it appears she left without seeing a provider.  She denies fever or chills.  She denies any diarrhea.    Prior to Admission medications   Medication Sig Start Date End Date Taking? Authorizing Provider  cloNIDine  (CATAPRES ) 0.1 MG tablet TAKE 1 TABLET(0.1 MG) BY MOUTH THREE TIMES DAILY AS NEEDED 06/19/24   Ajewole, Christana, MD  dicyclomine  (BENTYL ) 20 MG tablet Take 1 tablet (20 mg total) by mouth 2 (two) times daily. 05/10/23   Aneita Gwendlyn DASEN, MD  Elagolix Sodium  (ORILISSA ) 150 MG TABS Take 1 tablet (150 mg total) by mouth daily. 06/17/24   Ajewole, Christana, MD  FLUoxetine  (PROZAC ) 20 MG capsule Take 1 capsule (20 mg total) by mouth daily. 05/08/24   Harl Zane BRAVO, NP  gabapentin  (NEURONTIN ) 300 MG capsule Take 1 capsule (300 mg total) by mouth 3 (three) times daily. 05/08/24   Harl Zane BRAVO, NP  hydrOXYzine  (ATARAX ) 25 MG tablet Take 1 tablet (25 mg total) by mouth 3 (three) times daily as needed for anxiety. 05/08/24   Parsons, Brittney E, NP  Multiple Vitamins-Minerals (MULTIVITAMIN WITH MINERALS) tablet Take 1 tablet by mouth daily.    [provider]  norethindrone  (AYGESTIN ) 5 MG tablet Take 1 tablet (5 mg total) by mouth daily. Patient not taking: Reported on 04/18/2024 02/12/24   Ajewole, Christana, MD  OLANZapine  zydis (ZYPREXA ) 15 MG  disintegrating tablet Take 0.5 tablets (7.5 mg total) by mouth at bedtime. 05/08/24   Harl Zane BRAVO, NP  ondansetron  (ZOFRAN -ODT) 4 MG disintegrating tablet Take 1 tablet (4 mg total) by mouth every 8 (eight) hours as needed for nausea or vomiting. Patient not taking: Reported on 04/18/2024 03/10/24   Desiderio Chew, PA-C  oxyCODONE  (OXY IR/ROXICODONE ) 5 MG immediate release tablet Take 1 tablet (5 mg total) by mouth every 6 (six) hours as needed for breakthrough pain. 04/29/24   Ajewole, Christana, MD  Probiotic Product (PROBIOTIC ADVANCED PO) Take 1 capsule by mouth 2 (two) times a week.    [provider]  promethazine  (PROMETHEGAN) 25 MG suppository Place 1 suppository (25 mg total) rectally every 6 (six) hours as needed for nausea or vomiting. Patient not taking: Reported on 05/30/2024 10/03/23   Aneita Gwendlyn DASEN, MD  traZODone  (DESYREL ) 100 MG tablet Take 1 tablet (100 mg total) by mouth at bedtime as needed for sleep. 05/08/24   Harl Zane BRAVO, NP    Allergies: Bactrim  [sulfamethoxazole -trimethoprim ], Fish-derived products, Latex, Macrobid  [nitrofurantoin ], Mucinex [guaifenesin er], Reglan  [metoclopramide ], Shellfish-derived products, Sulfa  antibiotics, Bupropion, and Citalopram  hydrobromide    Review of Systems  Gastrointestinal:  Positive for abdominal pain.  All other systems reviewed and are negative.   Updated Vital Signs BP (!) 164/145 (BP Location: Right Arm)   Pulse 79   Temp 98.1 F (36.7 C) (Oral)   Resp 18   Ht 5' 4 (1.626 m)  Wt 79.4 kg   SpO2 100%   BMI 30.04 kg/m   Physical Exam Vitals and nursing note reviewed. Exam conducted with a chaperone present.   33 year old female, crying in pain, but is n no acute distress. Vital signs are significant for elevated blood pressure. Oxygen saturation is 100%, which is normal. Head is normocephalic and atraumatic. PERRLA, EOMI.  Lungs are clear without rales, wheezes, or rhonchi. Chest is nontender. Heart  has regular rate and rhythm without murmur. Abdomen is soft, flat, with moderate left suprapubic tenderness.  There is no rebound or guarding. Pelvic: Normal external female genitalia.  Cervix is closed.  No vaginal discharge seen.  She complained of marked pain during speculum exam.  On bimanual exam, there is tenderness diffusely but it seems to be maximal in the left adnexal area.  There is no cervical motion tenderness.  Exam is difficult, but no definite mass is felt. Skin is warm and dry without rash. Neurologic: Awake and alert, moves all extremities equally.  (all labs ordered are listed, but only abnormal results are displayed) Labs Reviewed - No data to display  EKG: None  Radiology: No results found.   Procedures   Medications Ordered in the ED - No data to display                                  Medical Decision Making Amount and/or Complexity of Data Reviewed Labs: ordered. Radiology: ordered.  Risk OTC drugs. Prescription drug management.   Severe pelvic pain.  This is a presentation which has a wide range of treatment options and carries with it a high risk of morbidity and complications.  Differential diagnosis includes, but is not limited to, endometriosis, ovarian torsion, ruptured ectopic pregnancy, pelvic inflammatory disease, urolithiasis, pyelonephritis, diverticulitis.  I reviewed her past records and do note ED visit earlier today and patient left prior to being seen by provider, ED visit on 05/19/2024 for similar complaints and she also left prior to seeing a provider.  Hospitalized on 04/29/2024 for D&C as part of evaluation of chronic pelvic pain with pathologic specimens confirming endometriosis.  I have ordered morphine  for pain, ondansetron  for nausea, IV fluids.  I have ordered pelvic ultrasound to rule out ovarian torsion.  Pelvic ultrasound shows no evidence of ovarian torsion or other pathology.  I have independently viewed the images, and agree with  the radiologist's interpretation.  I reviewed her laboratory tests, my interpretation is elevated random glucose level, mild hypokalemia, mild hypercalcemia, minimal leukocytosis which is nonspecific, normal lipase, not pregnant.  Wet prep is negative for yeast and trichomonas and clue cells.  Patient has been getting temporary relief with morphine  but pain has been recurring.  She is also requiring additional antiemetics.  At this point, the goal is to control of pain and nausea.  Case is signed out to Dr. Freddi.     Final diagnoses:  Acute pelvic pain, female  Endometriosis  Hypokalemia  Hypercalcemia  Elevated random blood glucose level    ED Discharge Orders     None          Raford Lenis, MD 07/30/24 [phone]

## 2024-07-30 NOTE — Discharge Instructions (Signed)
 If you develop worsening, continued, or recurrent abdominal pain, uncontrolled vomiting, fever, chest or back pain, or any other new/concerning symptoms then return to the ER for evaluation.

## 2024-07-30 NOTE — ED Notes (Signed)
 PT vomiting up blood, EDP notified

## 2024-07-30 NOTE — ED Triage Notes (Signed)
 BIBA for pelvic pain that shoots up her back and down her legs rated 10/10 Hx endometriosis.  Seen earlier today for same symptoms  130/p HR 78 RR 18 O2 97%

## 2024-07-31 ENCOUNTER — Ambulatory Visit: Payer: MEDICAID | Attending: Obstetrics and Gynecology | Admitting: Physical Therapy

## 2024-07-31 DIAGNOSIS — M6281 Muscle weakness (generalized): Secondary | ICD-10-CM | POA: Insufficient documentation

## 2024-07-31 DIAGNOSIS — R279 Unspecified lack of coordination: Secondary | ICD-10-CM | POA: Insufficient documentation

## 2024-07-31 DIAGNOSIS — M62838 Other muscle spasm: Secondary | ICD-10-CM | POA: Insufficient documentation

## 2024-07-31 LAB — GC/CHLAMYDIA PROBE AMP (~~LOC~~) NOT AT ARMC
Chlamydia: NEGATIVE
Comment: NEGATIVE
Comment: NORMAL
Neisseria Gonorrhea: NEGATIVE

## 2024-08-01 ENCOUNTER — Telehealth: Payer: Self-pay | Admitting: *Deleted

## 2024-08-01 NOTE — Telephone Encounter (Signed)
 Pt left message regarding scheduling of her hysterectomy. She stated that she has been seen @ hospital twice within the last week due to pain and wants to be scheduled soon. Per chart review, Benita Gains left message for pt on 9/2 to see if she is available for surgery on 10/28. I called pt and she stated she had not heard the voicemail and would definitely like to have surgery scheduled on 10/28. I advised that I will send a message to Dejuana. Pt will also listen to voice mail and then reach out to her.

## 2024-08-02 ENCOUNTER — Telehealth (HOSPITAL_COMMUNITY): Admitting: Psychiatry

## 2024-08-06 ENCOUNTER — Inpatient Hospital Stay (HOSPITAL_COMMUNITY)
Admission: RE | Admit: 2024-08-06 | Discharge: 2024-08-06 | Discharge status: Home / Self Care | Payer: MEDICAID | Source: Ambulatory Visit | Attending: Emergency Medicine | Admitting: Emergency Medicine

## 2024-08-06 ENCOUNTER — Encounter (HOSPITAL_COMMUNITY): Payer: Self-pay

## 2024-08-06 VITALS — BP 125/76 | HR 64 | Temp 98.4°F | Resp 18

## 2024-08-06 DIAGNOSIS — N3000 Acute cystitis without hematuria: Secondary | ICD-10-CM | POA: Insufficient documentation

## 2024-08-06 DIAGNOSIS — Z3202 Encounter for pregnancy test, result negative: Secondary | ICD-10-CM

## 2024-08-06 DIAGNOSIS — R300 Dysuria: Secondary | ICD-10-CM | POA: Diagnosis not present

## 2024-08-06 LAB — POCT URINALYSIS DIP (MANUAL ENTRY)
Bilirubin, UA: NEGATIVE
Glucose, UA: NEGATIVE mg/dL
Ketones, POC UA: NEGATIVE mg/dL
Leukocytes, UA: NEGATIVE
Nitrite, UA: POSITIVE — AB
Protein Ur, POC: NEGATIVE mg/dL
Spec Grav, UA: 1.015 (ref 1.010–1.025)
Urobilinogen, UA: 0.2 U/dL
pH, UA: 8.5 — AB (ref 5.0–8.0)

## 2024-08-06 LAB — POCT URINE PREGNANCY: Preg Test, Ur: NEGATIVE

## 2024-08-06 MED ORDER — DOXYCYCLINE HYCLATE 100 MG PO CAPS
100.0000 mg | ORAL_CAPSULE | Freq: Two times a day (BID) | ORAL | 0 refills | Status: DC
Start: 2024-08-06 — End: 2024-08-12

## 2024-08-06 MED ORDER — PHENAZOPYRIDINE HCL 200 MG PO TABS: 200.0000 mg | ORAL_TABLET | Freq: Three times a day (TID) | ORAL | 0 refills | Status: DC

## 2024-08-06 NOTE — Discharge Instructions (Signed)
 I prescribe doxycycline  as you have requested.  Take this twice daily for 7 days. I have also prescribed Pyridium  that you can take up to 3 times daily as needed for urinary discomfort. Your urine culture will return over the next few days and someone will call if results require any adjustment in treatment. Follow-up with your primary care provider or return here as needed.

## 2024-08-06 NOTE — ED Triage Notes (Addendum)
 Pt states she has had dysuria and RLQ pain X 1 week. She has taken Azo.
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
## 2024-08-07 ENCOUNTER — Ambulatory Visit: Payer: Self-pay

## 2024-08-07 LAB — URINE CULTURE: Culture: NO GROWTH

## 2024-08-09 ENCOUNTER — Telehealth (HOSPITAL_COMMUNITY): Payer: Self-pay | Admitting: Psychiatry

## 2024-08-12 ENCOUNTER — Encounter (HOSPITAL_COMMUNITY): Payer: Self-pay

## 2024-08-12 ENCOUNTER — Encounter: Payer: Self-pay | Admitting: *Deleted

## 2024-08-12 ENCOUNTER — Ambulatory Visit (HOSPITAL_COMMUNITY)
Admission: RE | Admit: 2024-08-12 | Discharge: 2024-08-12 | Disposition: A | Discharge status: Home / Self Care | Payer: MEDICAID | Source: Ambulatory Visit | Attending: Family Medicine | Admitting: Family Medicine

## 2024-08-12 VITALS — BP 125/87 | HR 62 | Temp 98.3°F | Resp 18

## 2024-08-12 DIAGNOSIS — J029 Acute pharyngitis, unspecified: Secondary | ICD-10-CM | POA: Insufficient documentation

## 2024-08-12 LAB — POCT RAPID STREP A (OFFICE): Rapid Strep A Screen: NEGATIVE

## 2024-08-12 MED ORDER — IBUPROFEN 600 MG PO TABS: 600.0000 mg | ORAL_TABLET | Freq: Three times a day (TID) | ORAL | 0 refills | Status: DC | PRN

## 2024-08-12 NOTE — Discharge Instructions (Signed)
 Your strep test is negative.  Culture of the throat will be sent, and staff will notify you if that is in turn positive.  Take ibuprofen  600 mg--1 tab every 8 hours as needed for pain.  Cepacol spray over-the-counter can also help.
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
## 2024-08-12 NOTE — ED Triage Notes (Signed)
 Pt states the tonsil on the left side is red and swollen X 5 days. She has been taking tyelnol as needed without relief.

## 2024-08-13 LAB — CULTURE, GROUP A STREP (THRC)

## 2024-08-23 ENCOUNTER — Telehealth (INDEPENDENT_AMBULATORY_CARE_PROVIDER_SITE_OTHER): Payer: MEDICAID | Admitting: Psychiatry

## 2024-08-23 ENCOUNTER — Encounter (HOSPITAL_COMMUNITY): Payer: Self-pay | Admitting: Psychiatry

## 2024-08-23 DIAGNOSIS — F603 Borderline personality disorder: Secondary | ICD-10-CM | POA: Diagnosis not present

## 2024-08-23 DIAGNOSIS — F411 Generalized anxiety disorder: Secondary | ICD-10-CM | POA: Diagnosis not present

## 2024-08-23 MED ORDER — HYDROXYZINE HCL 25 MG PO TABS: 25.0000 mg | ORAL_TABLET | Freq: Three times a day (TID) | ORAL | 3 refills | Status: DC | PRN

## 2024-08-23 MED ORDER — GABAPENTIN 300 MG PO CAPS: 300.0000 mg | ORAL_CAPSULE | Freq: Three times a day (TID) | ORAL | 3 refills | Status: DC

## 2024-08-23 MED ORDER — TRAZODONE HCL 100 MG PO TABS: 100.0000 mg | ORAL_TABLET | Freq: Every evening | ORAL | 3 refills | Status: DC | PRN

## 2024-08-23 MED ORDER — OLANZAPINE 15 MG PO TBDP: 7.5000 mg | ORAL_TABLET | Freq: Every day | ORAL | 3 refills | Status: DC

## 2024-08-23 MED ORDER — FLUOXETINE HCL 20 MG PO CAPS: 20.0000 mg | ORAL_CAPSULE | Freq: Every day | ORAL | 3 refills | Status: DC
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
## 2024-08-26 ENCOUNTER — Telehealth: Payer: Self-pay

## 2024-08-26 NOTE — Telephone Encounter (Signed)
 Pt came to office to sign Hysterectomy Form, when went to the Lobby Pt was gone, Called Pt & she states will come back in the morning to sign form.

## 2024-09-05 NOTE — Progress Notes (Signed)
 Patient came into office and hysterectomy statement was signed.  Gina Dorsey, RMA

## 2024-09-17 ENCOUNTER — Encounter (HOSPITAL_COMMUNITY): Payer: Self-pay | Admitting: Obstetrics and Gynecology

## 2024-09-17 NOTE — Pre-Procedure Instructions (Signed)
 Surgical Instructions  Your procedure is scheduled on :  Tuesday,  09-24-2024 Report to [facility] Main Entrance A at 5:30 AM, then check in the Admitting office. Any questions or running late day of surgery :  call [phone]  Questions prior to your surgery day:  call [phone], Monday -- Friday 8am - 4pm. If you experience any cold or flu symptoms such as cough, fever, chills, shortness of breath, etc. between now and you scheduled surgery, please notify your surgeon office.   Remember: Do Not eat any food and Do Not drink any food after midnight the night before surgery.  This includes No water ,  candy,  gum, and mints.  Take these medicines the morning of surgery with A SIPS OF WATER :   Gabapentin  (neurontin ) Fluoxetine  (prozac ) Atorvastatin (lipitor) Clonidine  (catapres ) Elagolix sodium  (orilissa ) Dicyclomine  (bentyl ) Famotidone (pepcid ) --- per anesthesia please take morning of surgery even though you take as needed  May take these medicines IF NEEDED:   Oxycodone  Hydroxyzine  (atarax ) Ondansetron  (zofran )  One week prior to surgery, STOP taking any Aspirin (unless otherwise instructed by your surgeon) Aleve , Naproxen , ibuprofen , Motrin , Advil , Goody's, BC's, all herbal medications/ supplements, fish oil, and non-prescription vitamins.  Do NOT Smoke (tobacco/ vaping/ marijuana ) and Do Not drink alcohol for 24 hours prior to your procedure.  For those patients that use a CPAP.  Please bring your CPAP/ mask/ tubing with them day of surgery . Anesthesia may ask recovery room nurse to use and if you stay the night you be asked to use it.  You will be asked to removed any contacts, glasses, piercing's, hearing aid's, dentures/ partials prior to surgery.  Please bring cases/ container/ solution/ etc., for them day of surgery.   Patients discharged the day of surgery will NOT be allowed to drive home.  You must have responsible driver and caregiver to stay at home with you the  next 24 hours.  SURGICAL WAITING ROOM VISITATION Patients may have no more than 2 support people in the waiting area - if more than 2 , these visitors may rotate.  Pre-op nurse will coordinate an appropriate time for 1 Adult support person, who may not rotate, to accompany patient in pre-op.  Aware some patients may have certain circumstances, speak to pre-op nurse day of surgery.  Children under the age 79 must have an adult with them who is not the patient and must remain in the main waiting area with an adult.  If the patient needs to stay at the hospital during part of their recovery, the visitor guidelines for inpatient rooms apply.  Please refer to the [facility] website for the visitor guidelines for any additional information.  If you received a COVID test during your pre-op visit it is requested that you wear a mask when out in public, stay away from anyone that may not be feeling well and notify your surgeon if you develop symptoms.  If you have been in contact with anyone that has tested positive in the past 10 days notify your surgeon.     Advance - Preparing for Surgery  Before surgery, you can play an important role. Because skin is not sterile, it needs to be as free of germs as possible. You can reduce the number of germs on your skin by washing with CHG (chlorhexidine  gluconate) soap before surgery. CHG is an antiseptic cleaner which kills germs and bonds with the skin to continue killing germs even after washing. Oral hygiene is  also important in reducing the risk of infection. Remember to brush your teeth with your regular toothpaste the morning of surgery.  Please DO NOT use if you have an allergy to CHG or antibacterial soaps. If your skin becomes reddened/irritated stop using the CHG and inform your Pre-op nurse day of surgery.  DO NOT shave (including legs and genital area) for at least 48 hours prior to your CHG shower.   Please follow these instructions  carefully:  Shower with CHG soap the night before surgery. If you choose to wash your hair, wash your hair first as usual with your normal shampoo. After you shampoo, rinse your hair and body thoroughly to remove the shampoo. Use CHG as you would any other liquid soap. You can apply CHG directly to the skin and wash gently with a clean washcloth or shower sponge. Apply the CHG soap to your body ONLY FROM THE NECK DOWN. Do not use on open wounds or open sores. Avoid contact with your eyes, ears, mouth, and genitals (private parts). Wash genitals (private parts) with your normal soap. Wash thoroughly, paying special attention to the area where your surgery will be performed. Thoroughly rinse your body with warm water  from the neck down. DO NOT shower/wash with your normal soap after using and rinsing off the CHG soap. DO NOT use lotions, oils, etc., after showering with CHG. Pat yourself dry with a clean towel. Wear clean pajamas. Place clean sheets on your bed the night of your CHG shower and do not sleep with pets.  Day of Surgery  DO NOT Apply any lotions,  powder,  oils,  deodorants (may use underarm deodorant),  cologne/  perfumes  or makeup Do Not wear jewelry /  piercing's/  metal/  permanent jewelry must be removed prior to arrival day of surgery. (No plastic piercing) Do Not wear nail polish,  gel polish,  artificial nails, or any other type of covering on natural finger nails (toe nails are okay) Remember to brush your teeth and rinse mouth out. Put on clean / comfortable clothes. [facility] is not responsible for valuables/ personal belongings

## 2024-09-17 NOTE — Progress Notes (Signed)
 Spoke w/ via phone for pre-op interview--- pt Lab needs dos----    upt/   ask MDA if wants UDS (pt stated last cocaine 2023 or 2024 but does smoke marijuana,  last documentation in epic +uds 04-13-2023 cocaine/ opiate/ cannibus))     Lab results------  lab appt 09-23-2024 @ 1300 getting CBC/ BMP/ T&S/ EKG COVID test -----patient states asymptomatic no test needed Arrive at ------- 0530 on 09-24-2024 NPO after MN w/ exception sips of water  w/ meds Pre-Surgery Ensure or G2: n/a  Med rec completed Medications to take morning of surgery ----- prozac , gabapentin , clonidine , lipitor, bentyl , pepcid / if needed take zofran , hydroxyzine , oxycodone  Diabetic medication ----- n/a  GLP1 agonist last dose: n/a GLP1 instructions:  Patient instructed no nail polish to be worn day of surgery Patient instructed to bring photo id and insurance card day of surgery Patient aware to have Driver (ride ) / caregiver    for 24 hours after surgery - mother, dana holmes Patient Special Instructions ----- will pick up soap and written instructions at lab appt Pre-Op special Instructions -----  pt stated she wants to stay the night ,  she needs her pain medication and needs rest.  Advised patient to speak to surgeon in pre-op day of surgery.  Patient verbalized understanding of instructions that were given at this phone interview. Patient denies chest pain, sob, fever, cough at the interview.

## 2024-09-23 ENCOUNTER — Encounter (HOSPITAL_COMMUNITY)
Admission: RE | Admit: 2024-09-23 | Discharge: 2024-09-23 | Disposition: A | Discharge status: Home / Self Care | Payer: MEDICAID | Source: Ambulatory Visit | Attending: Obstetrics and Gynecology | Admitting: Obstetrics and Gynecology

## 2024-09-23 DIAGNOSIS — R102 Pelvic and perineal pain unspecified side: Secondary | ICD-10-CM

## 2024-09-23 DIAGNOSIS — Z01818 Encounter for other preprocedural examination: Secondary | ICD-10-CM | POA: Insufficient documentation

## 2024-09-23 LAB — TYPE AND SCREEN
ABO/RH(D): O POS
Antibody Screen: NEGATIVE

## 2024-09-23 LAB — BASIC METABOLIC PANEL WITH GFR
Anion gap: 11 (ref 5–15)
BUN: <5 mg/dL — ABNORMAL LOW (ref 6–20)
CO2: 25 mmol/L (ref 22–32)
Calcium: 9.5 mg/dL (ref 8.9–10.3)
Chloride: 104 mmol/L (ref 98–111)
Creatinine, Ser: 0.94 mg/dL (ref 0.44–1.00)
GFR, Estimated: >60 mL/min (ref >60)
Glucose, Bld: 129 mg/dL — ABNORMAL HIGH (ref 70–99)
Potassium: 4.1 mmol/L (ref 3.5–5.1)
Sodium: 140 mmol/L (ref 135–145)

## 2024-09-23 LAB — CBC
HCT: 42.7 % (ref 36.0–46.0)
Hemoglobin: 14.4 g/dL (ref 12.0–15.0)
MCH: 29.6 pg (ref 26.0–34.0)
MCHC: 33.7 g/dL (ref 30.0–36.0)
MCV: 87.9 fL (ref 80.0–100.0)
Platelets: 274 K/uL (ref 150–400)
RBC: 4.86 MIL/uL (ref 3.87–5.11)
RDW: 12.3 % (ref 11.5–15.5)
WBC: 7.3 K/uL (ref 4.0–10.5)
nRBC: 0 % (ref 0.0–0.2)

## 2024-09-23 NOTE — Anesthesia Preprocedure Evaluation (Signed)
 Anesthesia Evaluation  Patient identified by MRN, date of birth, ID band Patient awake    Reviewed: Allergy & Precautions, H&P , NPO status , Patient's Chart, lab work & pertinent test results  Airway Mallampati: II  TM Distance: >3 FB Neck ROM: Full    Dental no notable dental hx.    Pulmonary neg sleep apnea, Current Smoker and Patient abstained from smoking.   Pulmonary exam normal breath sounds clear to auscultation       Cardiovascular hypertension, (-) angina (-) Past MI Normal cardiovascular exam Rhythm:Regular Rate:Normal     Neuro/Psych neg Seizures PSYCHIATRIC DISORDERS Anxiety Depression Bipolar Disorder   negative neurological ROS     GI/Hepatic Neg liver ROS,GERD  ,,  Endo/Other  negative endocrine ROS    Renal/GU negative Renal ROS  negative genitourinary   Musculoskeletal negative musculoskeletal ROS (+)    Abdominal   Peds negative pediatric ROS (+)  Hematology negative hematology ROS (+)   Anesthesia Other Findings   Reproductive/Obstetrics negative OB ROS                              Anesthesia Physical Anesthesia Plan  ASA: 3  Anesthesia Plan: General   Post-op Pain Management: Ketamine  IV*, Precedex  and Tylenol  PO (pre-op)*   Induction: Intravenous  PONV Risk Score and Plan: 2 and Ondansetron , Dexamethasone , Midazolam , Scopolamine  patch - Pre-op and Treatment may vary due to age or medical condition  Airway Management Planned: Oral ETT  Additional Equipment: None  Intra-op Plan:   Post-operative Plan: Extubation in OR  Informed Consent: I have reviewed the patients History and Physical, chart, labs and discussed the procedure including the risks, benefits and alternatives for the proposed anesthesia with the patient or authorized representative who has indicated his/her understanding and acceptance.     Dental advisory given  Plan Discussed with:  CRNA  Anesthesia Plan Comments:         Anesthesia Quick Evaluation

## 2024-09-24 ENCOUNTER — Ambulatory Visit (HOSPITAL_COMMUNITY)
Admission: RE | Admit: 2024-09-24 | Discharge: 2024-09-24 | Disposition: A | Discharge status: Home / Self Care | Payer: MEDICAID | Attending: Obstetrics and Gynecology | Admitting: Obstetrics and Gynecology

## 2024-09-24 ENCOUNTER — Encounter (HOSPITAL_COMMUNITY): Payer: MEDICAID | Admitting: Anesthesiology

## 2024-09-24 ENCOUNTER — Encounter (HOSPITAL_COMMUNITY)
Admission: RE | Disposition: A | Discharge status: Home / Self Care | Payer: Self-pay | Source: Home / Self Care | Attending: Obstetrics and Gynecology

## 2024-09-24 ENCOUNTER — Other Ambulatory Visit (HOSPITAL_COMMUNITY): Payer: Self-pay

## 2024-09-24 ENCOUNTER — Ambulatory Visit (HOSPITAL_BASED_OUTPATIENT_CLINIC_OR_DEPARTMENT_OTHER): Payer: MEDICAID | Admitting: Anesthesiology

## 2024-09-24 ENCOUNTER — Encounter (HOSPITAL_COMMUNITY): Payer: Self-pay | Admitting: Obstetrics and Gynecology

## 2024-09-24 DIAGNOSIS — F17290 Nicotine dependence, other tobacco product, uncomplicated: Secondary | ICD-10-CM | POA: Insufficient documentation

## 2024-09-24 DIAGNOSIS — K219 Gastro-esophageal reflux disease without esophagitis: Secondary | ICD-10-CM | POA: Diagnosis not present

## 2024-09-24 DIAGNOSIS — N838 Other noninflammatory disorders of ovary, fallopian tube and broad ligament: Secondary | ICD-10-CM | POA: Diagnosis not present

## 2024-09-24 DIAGNOSIS — Z79891 Long term (current) use of opiate analgesic: Secondary | ICD-10-CM | POA: Insufficient documentation

## 2024-09-24 DIAGNOSIS — N8301 Follicular cyst of right ovary: Secondary | ICD-10-CM | POA: Insufficient documentation

## 2024-09-24 DIAGNOSIS — K5909 Other constipation: Secondary | ICD-10-CM | POA: Insufficient documentation

## 2024-09-24 DIAGNOSIS — R102 Pelvic and perineal pain unspecified side: Secondary | ICD-10-CM | POA: Diagnosis present

## 2024-09-24 DIAGNOSIS — F411 Generalized anxiety disorder: Secondary | ICD-10-CM | POA: Insufficient documentation

## 2024-09-24 DIAGNOSIS — N888 Other specified noninflammatory disorders of cervix uteri: Secondary | ICD-10-CM | POA: Insufficient documentation

## 2024-09-24 DIAGNOSIS — N809 Endometriosis, unspecified: Secondary | ICD-10-CM | POA: Insufficient documentation

## 2024-09-24 DIAGNOSIS — F3281 Premenstrual dysphoric disorder: Secondary | ICD-10-CM | POA: Diagnosis present

## 2024-09-24 DIAGNOSIS — Z79899 Other long term (current) drug therapy: Secondary | ICD-10-CM | POA: Diagnosis not present

## 2024-09-24 DIAGNOSIS — I10 Essential (primary) hypertension: Secondary | ICD-10-CM | POA: Diagnosis not present

## 2024-09-24 DIAGNOSIS — G8918 Other acute postprocedural pain: Secondary | ICD-10-CM | POA: Diagnosis not present

## 2024-09-24 DIAGNOSIS — F17210 Nicotine dependence, cigarettes, uncomplicated: Secondary | ICD-10-CM

## 2024-09-24 DIAGNOSIS — F603 Borderline personality disorder: Secondary | ICD-10-CM

## 2024-09-24 DIAGNOSIS — F418 Other specified anxiety disorders: Secondary | ICD-10-CM | POA: Diagnosis not present

## 2024-09-24 DIAGNOSIS — N839 Noninflammatory disorder of ovary, fallopian tube and broad ligament, unspecified: Secondary | ICD-10-CM | POA: Diagnosis not present

## 2024-09-24 DIAGNOSIS — Z01818 Encounter for other preprocedural examination: Secondary | ICD-10-CM

## 2024-09-24 DIAGNOSIS — G8929 Other chronic pain: Secondary | ICD-10-CM | POA: Diagnosis present

## 2024-09-24 HISTORY — DX: Endometriosis, unspecified: N80.9

## 2024-09-24 HISTORY — DX: Generalized anxiety disorder: F41.1

## 2024-09-24 HISTORY — DX: Pelvic and perineal pain unspecified side: R10.20

## 2024-09-24 HISTORY — DX: Personal history of suicidal behavior: Z91.51

## 2024-09-24 HISTORY — DX: Depression, unspecified: F32.A

## 2024-09-24 HISTORY — PX: TOTAL LAPAROSCOPIC HYSTERECTOMY WITH SALPINGECTOMY: SHX6742

## 2024-09-24 HISTORY — DX: Essential (primary) hypertension: I10

## 2024-09-24 HISTORY — DX: Gastro-esophageal reflux disease without esophagitis: K21.9

## 2024-09-24 HISTORY — DX: Other chronic pain: G89.29

## 2024-09-24 HISTORY — DX: Personal history of other mental and behavioral disorders: Z86.59

## 2024-09-24 HISTORY — DX: Personal history of other infectious and parasitic diseases: Z86.19

## 2024-09-24 HISTORY — PX: CYSTOSCOPY: SHX5120

## 2024-09-24 HISTORY — DX: Panic disorder (episodic paroxysmal anxiety): F41.0

## 2024-09-24 HISTORY — DX: Other psychoactive substance abuse, uncomplicated: F19.10

## 2024-09-24 LAB — RAPID URINE DRUG SCREEN, HOSP PERFORMED
Amphetamines: NOT DETECTED
Barbiturates: NOT DETECTED
Benzodiazepines: NOT DETECTED
Cocaine: NOT DETECTED
Opiates: POSITIVE — AB
Tetrahydrocannabinol: POSITIVE — AB

## 2024-09-24 LAB — POCT PREGNANCY, URINE: Preg Test, Ur: NEGATIVE

## 2024-09-24 SURGERY — HYSTERECTOMY, TOTAL, LAPAROSCOPIC, WITH SALPINGECTOMY
Anesthesia: General | Site: Abdomen

## 2024-09-24 MED ORDER — OXYCODONE HCL 5 MG PO TABS: 5.0000 mg | ORAL_TABLET | ORAL | Status: DC | PRN

## 2024-09-24 MED ORDER — CEFAZOLIN SODIUM-DEXTROSE 2-4 GM/100ML-% IV SOLN
INTRAVENOUS | Status: AC
  Filled 2024-09-24: qty 100

## 2024-09-24 MED ORDER — HYDROMORPHONE HCL 1 MG/ML IJ SOLN
0.2000 mg | INTRAMUSCULAR | Status: DC | PRN
  Administered 2024-09-24 (×2): 0.4 mg via INTRAVENOUS
  Filled 2024-09-24 (×2): qty 0.5

## 2024-09-24 MED ORDER — LABETALOL HCL 5 MG/ML IV SOLN
INTRAVENOUS | Status: DC | PRN
  Administered 2024-09-24: 2.5 mg via INTRAVENOUS
  Administered 2024-09-24: 5 mg via INTRAVENOUS

## 2024-09-24 MED ORDER — IBUPROFEN 600 MG PO TABS
600.0000 mg | ORAL_TABLET | Freq: Three times a day (TID) | ORAL | 0 refills | Status: AC | PRN
  Filled 2024-09-24: qty 60, 20d supply, fill #0

## 2024-09-24 MED ORDER — GABAPENTIN 300 MG PO CAPS
300.0000 mg | ORAL_CAPSULE | Freq: Three times a day (TID) | ORAL | Status: DC
  Administered 2024-09-24: 300 mg via ORAL
  Filled 2024-09-24: qty 1

## 2024-09-24 MED ORDER — BUPIVACAINE LIPOSOME 1.3 % IJ SUSP
INTRAMUSCULAR | Status: AC
  Filled 2024-09-24: qty 20

## 2024-09-24 MED ORDER — ACETAMINOPHEN 10 MG/ML IV SOLN
INTRAVENOUS | Status: AC
  Filled 2024-09-24: qty 100

## 2024-09-24 MED ORDER — LACTATED RINGERS IV SOLN: INTRAVENOUS | Status: DC

## 2024-09-24 MED ORDER — OXYCODONE HCL 5 MG PO TABS: 5.0000 mg | ORAL_TABLET | Freq: Once | ORAL | Status: DC | PRN

## 2024-09-24 MED ORDER — ROCURONIUM BROMIDE 10 MG/ML (PF) SYRINGE
PREFILLED_SYRINGE | INTRAVENOUS | Status: AC
  Filled 2024-09-24: qty 10

## 2024-09-24 MED ORDER — ACETAMINOPHEN 500 MG PO TABS
1000.0000 mg | ORAL_TABLET | Freq: Four times a day (QID) | ORAL | 1 refills | Status: AC | PRN
  Filled 2024-09-24: qty 120, 15d supply, fill #0

## 2024-09-24 MED ORDER — POLYETHYLENE GLYCOL 3350 17 GM/SCOOP PO POWD
17.0000 g | Freq: Every day | ORAL | 1 refills | Status: AC | PRN
  Filled 2024-09-24: qty 238, 14d supply, fill #0

## 2024-09-24 MED ORDER — MIDAZOLAM HCL 2 MG/2ML IJ SOLN
INTRAMUSCULAR | Status: AC
  Filled 2024-09-24: qty 2

## 2024-09-24 MED ORDER — NICOTINE 14 MG/24HR TD PT24
14.0000 mg | MEDICATED_PATCH | Freq: Every day | TRANSDERMAL | 0 refills | Status: DC
  Filled 2024-09-24: qty 28, 28d supply, fill #0

## 2024-09-24 MED ORDER — METHOCARBAMOL 500 MG PO TABS
1000.0000 mg | ORAL_TABLET | Freq: Three times a day (TID) | ORAL | Status: DC | PRN
Start: 2024-09-24 — End: 2024-09-24
  Administered 2024-09-24: 1000 mg via ORAL
  Filled 2024-09-24 (×2): qty 2

## 2024-09-24 MED ORDER — HYDROMORPHONE HCL 1 MG/ML IJ SOLN
INTRAMUSCULAR | Status: AC
  Filled 2024-09-24: qty 1

## 2024-09-24 MED ORDER — DROPERIDOL 2.5 MG/ML IJ SOLN: 0.6250 mg | Freq: Once | INTRAMUSCULAR | Status: DC | PRN

## 2024-09-24 MED ORDER — OXYCODONE HCL 5 MG/5ML PO SOLN: 5.0000 mg | Freq: Once | ORAL | Status: DC | PRN

## 2024-09-24 MED ORDER — DICYCLOMINE HCL 20 MG PO TABS
20.0000 mg | ORAL_TABLET | Freq: Two times a day (BID) | ORAL | Status: DC
  Administered 2024-09-24: 20 mg via ORAL
  Filled 2024-09-24: qty 1

## 2024-09-24 MED ORDER — ONDANSETRON HCL 4 MG/2ML IJ SOLN
INTRAMUSCULAR | Status: DC | PRN
  Administered 2024-09-24: 4 mg via INTRAVENOUS

## 2024-09-24 MED ORDER — PROPOFOL 10 MG/ML IV BOLUS
INTRAVENOUS | Status: DC | PRN
Start: 2024-09-24 — End: 2024-09-24
  Administered 2024-09-24: 200 mg via INTRAVENOUS

## 2024-09-24 MED ORDER — KETAMINE HCL 50 MG/5ML IJ SOSY
PREFILLED_SYRINGE | INTRAMUSCULAR | Status: DC | PRN
  Administered 2024-09-24: 50 mg via INTRAVENOUS

## 2024-09-24 MED ORDER — CEFAZOLIN SODIUM-DEXTROSE 2-4 GM/100ML-% IV SOLN
2.0000 g | INTRAVENOUS | Status: AC
  Administered 2024-09-24: 2 g via INTRAVENOUS

## 2024-09-24 MED ORDER — ACETAMINOPHEN 500 MG PO TABS: 1000.0000 mg | ORAL_TABLET | ORAL | Status: DC

## 2024-09-24 MED ORDER — GABAPENTIN 300 MG PO CAPS
300.0000 mg | ORAL_CAPSULE | Freq: Three times a day (TID) | ORAL | 0 refills | Status: DC
  Filled 2024-09-24: qty 15, 5d supply, fill #0

## 2024-09-24 MED ORDER — BUPIVACAINE HCL (PF) 0.5 % IJ SOLN
INTRAMUSCULAR | Status: AC
Start: 2024-09-24 — End: 2024-09-24
  Filled 2024-09-24: qty 30

## 2024-09-24 MED ORDER — DEXAMETHASONE SOD PHOSPHATE PF 10 MG/ML IJ SOLN
INTRAMUSCULAR | Status: DC | PRN
  Administered 2024-09-24: 10 mg via INTRAVENOUS

## 2024-09-24 MED ORDER — SIMETHICONE 80 MG PO CHEW
80.0000 mg | CHEWABLE_TABLET | Freq: Four times a day (QID) | ORAL | 0 refills | Status: AC | PRN
  Filled 2024-09-24: qty 30, 8d supply, fill #0

## 2024-09-24 MED ORDER — HYDROXYZINE HCL 50 MG PO TABS: 25.0000 mg | ORAL_TABLET | Freq: Three times a day (TID) | ORAL | Status: DC | PRN

## 2024-09-24 MED ORDER — CLIMARA PRO 0.045-0.015 MG/DAY TD PTWK: 1.0000 | MEDICATED_PATCH | TRANSDERMAL | 12 refills | Status: AC

## 2024-09-24 MED ORDER — TRAZODONE HCL 100 MG PO TABS: 100.0000 mg | ORAL_TABLET | Freq: Every evening | ORAL | Status: DC | PRN

## 2024-09-24 MED ORDER — SIMETHICONE 80 MG PO CHEW
CHEWABLE_TABLET | Freq: Four times a day (QID) | ORAL | 0 refills | Status: AC | PRN
  Filled 2024-09-24: qty 60, 15d supply, fill #0

## 2024-09-24 MED ORDER — KETAMINE HCL 50 MG/5ML IJ SOSY
PREFILLED_SYRINGE | INTRAMUSCULAR | Status: AC
  Filled 2024-09-24: qty 5

## 2024-09-24 MED ORDER — LABETALOL HCL 5 MG/ML IV SOLN
INTRAVENOUS | Status: AC
  Filled 2024-09-24: qty 4

## 2024-09-24 MED ORDER — ROCURONIUM BROMIDE 10 MG/ML (PF) SYRINGE
PREFILLED_SYRINGE | INTRAVENOUS | Status: DC | PRN
Start: 2024-09-24 — End: 2024-09-24
  Administered 2024-09-24: 70 mg via INTRAVENOUS

## 2024-09-24 MED ORDER — KETOROLAC TROMETHAMINE 30 MG/ML IJ SOLN
30.0000 mg | Freq: Four times a day (QID) | INTRAMUSCULAR | Status: DC
  Administered 2024-09-24: 30 mg via INTRAVENOUS
  Filled 2024-09-24: qty 1

## 2024-09-24 MED ORDER — HYDROMORPHONE HCL 1 MG/ML IJ SOLN
0.2500 mg | INTRAMUSCULAR | Status: DC | PRN
  Administered 2024-09-24 (×2): 0.5 mg via INTRAVENOUS

## 2024-09-24 MED ORDER — BUPIVACAINE HCL 0.5 % IJ SOLN
INTRAMUSCULAR | Status: DC | PRN
  Administered 2024-09-24: 40 mL via INTRAMUSCULAR

## 2024-09-24 MED ORDER — POVIDONE-IODINE 10 % EX SWAB: 2.0000 | Freq: Once | CUTANEOUS | Status: DC

## 2024-09-24 MED ORDER — PROPOFOL 10 MG/ML IV BOLUS
INTRAVENOUS | Status: AC
Start: 2024-09-24 — End: 2024-09-24
  Filled 2024-09-24: qty 20

## 2024-09-24 MED ORDER — MAGNESIUM CITRATE PO SOLN
1.0000 | Freq: Once | ORAL | Status: DC | PRN
Start: 2024-09-24 — End: 2024-09-24

## 2024-09-24 MED ORDER — FENTANYL CITRATE (PF) 100 MCG/2ML IJ SOLN
INTRAMUSCULAR | Status: AC
  Filled 2024-09-24: qty 2

## 2024-09-24 MED ORDER — CLONIDINE HCL 0.1 MG PO TABS: 0.1000 mg | ORAL_TABLET | Freq: Three times a day (TID) | ORAL | Status: DC | PRN

## 2024-09-24 MED ORDER — SIMETHICONE 80 MG PO CHEW: 80.0000 mg | CHEWABLE_TABLET | Freq: Four times a day (QID) | ORAL | Status: DC | PRN

## 2024-09-24 MED ORDER — LIDOCAINE 2% (20 MG/ML) 5 ML SYRINGE
INTRAMUSCULAR | Status: AC
  Filled 2024-09-24: qty 5

## 2024-09-24 MED ORDER — FENTANYL CITRATE (PF) 250 MCG/5ML IJ SOLN
INTRAMUSCULAR | Status: AC
  Filled 2024-09-24: qty 5

## 2024-09-24 MED ORDER — ONDANSETRON HCL 4 MG PO TABS: 4.0000 mg | ORAL_TABLET | Freq: Four times a day (QID) | ORAL | Status: DC | PRN

## 2024-09-24 MED ORDER — POLYETHYLENE GLYCOL 3350 17 GM/SCOOP PO POWD
17.0000 g | Freq: Every day | ORAL | 0 refills | Status: AC
  Filled 2024-09-24: qty 238, 14d supply, fill #0

## 2024-09-24 MED ORDER — SCOPOLAMINE 1 MG/3DAYS TD PT72
MEDICATED_PATCH | TRANSDERMAL | Status: AC
  Filled 2024-09-24: qty 1

## 2024-09-24 MED ORDER — GABAPENTIN 300 MG PO CAPS: 400.0000 mg | ORAL_CAPSULE | Freq: Three times a day (TID) | ORAL | Status: DC

## 2024-09-24 MED ORDER — IBUPROFEN 600 MG PO TABS: 600.0000 mg | ORAL_TABLET | Freq: Four times a day (QID) | ORAL | Status: DC

## 2024-09-24 MED ORDER — CEFAZOLIN SODIUM-DEXTROSE 2-4 GM/100ML-% IV SOLN
INTRAVENOUS | Status: AC
Start: 2024-09-24 — End: 2024-09-24
  Filled 2024-09-24: qty 100

## 2024-09-24 MED ORDER — DICYCLOMINE HCL 20 MG PO TABS
20.0000 mg | ORAL_TABLET | Freq: Two times a day (BID) | ORAL | Status: DC
  Filled 2024-09-24: qty 1

## 2024-09-24 MED ORDER — SUGAMMADEX SODIUM 200 MG/2ML IV SOLN
INTRAVENOUS | Status: DC | PRN
  Administered 2024-09-24: 170 mg via INTRAVENOUS

## 2024-09-24 MED ORDER — NICOTINE 14 MG/24HR TD PT24: 14.0000 mg | MEDICATED_PATCH | Freq: Every day | TRANSDERMAL | Status: DC

## 2024-09-24 MED ORDER — KETOROLAC TROMETHAMINE 30 MG/ML IJ SOLN
INTRAMUSCULAR | Status: DC | PRN
  Administered 2024-09-24: 30 mg via INTRAVENOUS

## 2024-09-24 MED ORDER — LORAZEPAM 2 MG/ML IJ SOLN
0.5000 mg | Freq: Once | INTRAMUSCULAR | Status: AC
  Administered 2024-09-24: 0.5 mg via INTRAVENOUS
  Filled 2024-09-24: qty 1

## 2024-09-24 MED ORDER — ACETAMINOPHEN 10 MG/ML IV SOLN
INTRAVENOUS | Status: DC | PRN
  Administered 2024-09-24: 1000 mg via INTRAVENOUS

## 2024-09-24 MED ORDER — ACETAMINOPHEN 10 MG/ML IV SOLN: 1000.0000 mg | Freq: Once | INTRAVENOUS | Status: DC | PRN

## 2024-09-24 MED ORDER — CELECOXIB 200 MG PO CAPS: 200.0000 mg | ORAL_CAPSULE | Freq: Once | ORAL | Status: DC

## 2024-09-24 MED ORDER — CHLORHEXIDINE GLUCONATE 0.12 % MT SOLN
15.0000 mL | Freq: Once | OROMUCOSAL | Status: AC
  Administered 2024-09-24: 15 mL via OROMUCOSAL

## 2024-09-24 MED ORDER — ORAL CARE MOUTH RINSE: 15.0000 mL | Freq: Once | OROMUCOSAL | Status: AC

## 2024-09-24 MED ORDER — FENTANYL CITRATE (PF) 250 MCG/5ML IJ SOLN
INTRAMUSCULAR | Status: DC | PRN
  Administered 2024-09-24 (×2): 100 ug via INTRAVENOUS

## 2024-09-24 MED ORDER — SCOPOLAMINE 1 MG/3DAYS TD PT72
1.0000 | MEDICATED_PATCH | TRANSDERMAL | Status: DC
  Administered 2024-09-24: 1 mg via TRANSDERMAL

## 2024-09-24 MED ORDER — FENTANYL CITRATE (PF) 100 MCG/2ML IJ SOLN
25.0000 ug | INTRAMUSCULAR | Status: DC | PRN
  Administered 2024-09-24: 25 ug via INTRAVENOUS
  Administered 2024-09-24: 50 ug via INTRAVENOUS

## 2024-09-24 MED ORDER — METHOCARBAMOL 500 MG PO TABS
1000.0000 mg | ORAL_TABLET | Freq: Three times a day (TID) | ORAL | 0 refills | Status: AC | PRN
  Filled 2024-09-24: qty 24, 4d supply, fill #0

## 2024-09-24 MED ORDER — KETOROLAC TROMETHAMINE 30 MG/ML IJ SOLN
INTRAMUSCULAR | Status: AC
  Filled 2024-09-24: qty 1

## 2024-09-24 MED ORDER — LIDOCAINE 2% (20 MG/ML) 5 ML SYRINGE
INTRAMUSCULAR | Status: DC | PRN
Start: 2024-09-24 — End: 2024-09-24
  Administered 2024-09-24: 50 mg via INTRAVENOUS

## 2024-09-24 MED ORDER — FAMOTIDINE 20 MG PO TABS: 20.0000 mg | ORAL_TABLET | Freq: Every day | ORAL | Status: DC | PRN

## 2024-09-24 MED ORDER — ONDANSETRON HCL 4 MG/2ML IJ SOLN: 4.0000 mg | Freq: Four times a day (QID) | INTRAMUSCULAR | Status: DC | PRN

## 2024-09-24 MED ORDER — ONDANSETRON HCL 4 MG/2ML IJ SOLN
INTRAMUSCULAR | Status: AC
  Filled 2024-09-24: qty 2

## 2024-09-24 MED ORDER — DOCUSATE SODIUM 100 MG PO CAPS
100.0000 mg | ORAL_CAPSULE | Freq: Two times a day (BID) | ORAL | Status: DC
  Administered 2024-09-24: 100 mg via ORAL
  Filled 2024-09-24: qty 1

## 2024-09-24 MED ORDER — MIDAZOLAM HCL (PF) 2 MG/2ML IJ SOLN
INTRAMUSCULAR | Status: DC | PRN
  Administered 2024-09-24: 2 mg via INTRAVENOUS

## 2024-09-24 MED ORDER — ACETAMINOPHEN 500 MG PO TABS
1000.0000 mg | ORAL_TABLET | Freq: Four times a day (QID) | ORAL | Status: DC
  Administered 2024-09-24: 1000 mg via ORAL
  Filled 2024-09-24: qty 2

## 2024-09-24 MED ORDER — MIDAZOLAM HCL (PF) 2 MG/2ML IJ SOLN
2.0000 mg | Freq: Once | INTRAMUSCULAR | Status: AC
  Administered 2024-09-24: 2 mg via INTRAVENOUS

## 2024-09-24 MED ORDER — FLUOXETINE HCL 20 MG PO CAPS: 20.0000 mg | ORAL_CAPSULE | Freq: Every day | ORAL | Status: DC

## 2024-09-24 MED ORDER — POLYETHYLENE GLYCOL 3350 17 G PO PACK: 17.0000 g | PACK | Freq: Every day | ORAL | Status: DC | PRN

## 2024-09-24 MED ORDER — OXYCODONE HCL 5 MG PO TABS
5.0000 mg | ORAL_TABLET | Freq: Four times a day (QID) | ORAL | 0 refills | Status: AC | PRN
  Filled 2024-09-24: qty 15, 4d supply, fill #0

## 2024-09-24 MED ORDER — CHLORHEXIDINE GLUCONATE 0.12 % MT SOLN
OROMUCOSAL | Status: AC
  Filled 2024-09-24: qty 15

## 2024-09-24 SURGICAL SUPPLY — 54 items
CATH FOLEY LF 16FR (CATHETERS) IMPLANT
CHLORAPREP W/TINT 26 (MISCELLANEOUS) IMPLANT
COVER BACK TABLE 60X90IN (DRAPES) IMPLANT
COVER MAYO STAND STRL (DRAPES) ×3 IMPLANT
DEFOGGER SCOPE WARM SEASHARP (MISCELLANEOUS) ×3 IMPLANT
DERMABOND ADVANCED .7 DNX12 (GAUZE/BANDAGES/DRESSINGS) ×3 IMPLANT
DRAPE SURG IRRIG POUCH 19X23 (DRAPES) ×3 IMPLANT
DURAPREP 26ML APPLICATOR (WOUND CARE) ×3 IMPLANT
GLOVE BIO SURGEON STRL SZ7 (GLOVE) ×6 IMPLANT
GLOVE SURG SS PI 6.5 STRL IVOR (GLOVE) IMPLANT
GLOVE SURG UNDER POLY LF SZ7 (GLOVE) ×12 IMPLANT
GOWN STRL REUS W/ TWL XL LVL3 (GOWN DISPOSABLE) ×9 IMPLANT
HIBICLENS CHG 4% 4OZ BTL (MISCELLANEOUS) ×6 IMPLANT
IRRIGATION SUCT STRKRFLW 2 WTP (MISCELLANEOUS) ×3 IMPLANT
KIT PINK PAD W/HEAD ARM REST (MISCELLANEOUS) ×3 IMPLANT
KIT TURNOVER KIT B (KITS) ×3 IMPLANT
LIGASURE VESSEL 5MM BLUNT TIP (ELECTROSURGICAL) ×3 IMPLANT
NDL INSUFFLATION 14GA 120MM (NEEDLE) ×3 IMPLANT
NDL SPNL 22GX3.5 QUINCKE BK (NEEDLE) ×3 IMPLANT
NEEDLE INSUFFLATION 14GA 120MM (NEEDLE) IMPLANT
NEEDLE SPNL 22GX3.5 QUINCKE BK (NEEDLE) IMPLANT
OCCLUDER COLPOPNEUMO (BALLOONS) IMPLANT
PACK LAPAROSCOPY BASIN (CUSTOM PROCEDURE TRAY) ×3 IMPLANT
POUCH LAPAROSCOPIC INSTRUMENT (MISCELLANEOUS) ×3 IMPLANT
POWDER SURGICEL 3.0 GRAM (HEMOSTASIS) IMPLANT
SCALPEL HRMNC RUM II 2.5 SILVR (DISPOSABLE) IMPLANT
SCALPEL HRMNC RUM II 3.0 SILVR (DISPOSABLE) IMPLANT
SCALPEL HRMNC RUM II 3.5 SILVR (DISPOSABLE) IMPLANT
SCALPEL HRMNC RUM II 4.0 SILVR (DISPOSABLE) IMPLANT
SCISSORS LAP 5X35 DISP (ENDOMECHANICALS) IMPLANT
SET CYSTO IRRIGATION (SET/KITS/TRAYS/PACK) ×3 IMPLANT
SET TRI-LUMEN FLTR TB AIRSEAL (TUBING) ×3 IMPLANT
SHEARS HARMONIC 45 ACE (MISCELLANEOUS) IMPLANT
SLEEVE ADV FIXATION 5X100MM (TROCAR) ×3 IMPLANT
SOLN 0.9% NACL POUR BTL 1000ML (IV SOLUTION) ×3 IMPLANT
SUT VIC AB 0 CT1 27XBRD ANBCTR (SUTURE) IMPLANT
SUT VIC AB 4-0 PS2 18 (SUTURE) ×6 IMPLANT
SUT VLOC 180 0 9IN GS21 (SUTURE) ×3 IMPLANT
SYR 10ML LL (SYRINGE) ×3 IMPLANT
SYR 50ML LL SCALE MARK (SYRINGE) ×3 IMPLANT
SYR CONTROL 10ML LL (SYRINGE) ×3 IMPLANT
TIP ENDOSCOPIC SURGICEL (TIP) IMPLANT
TIP RUMI ORANGE 6.7MMX12CM (TIP) IMPLANT
TIP UTERINE 5.1X6CM LAV DISP (MISCELLANEOUS) IMPLANT
TIP UTERINE 6.7X10CM GRN DISP (MISCELLANEOUS) IMPLANT
TIP UTERINE 6.7X8CM BLUE DISP (MISCELLANEOUS) IMPLANT
TOWEL GREEN STERILE FF (TOWEL DISPOSABLE) ×6 IMPLANT
TRAY FOLEY W/BAG SLVR 14FR (SET/KITS/TRAYS/PACK) ×3 IMPLANT
TROCAR ADV FIXATION 5X100MM (TROCAR) ×3 IMPLANT
TROCAR PORT AIRSEAL 8X120 (TROCAR) ×3 IMPLANT
TROCAR XCEL NON BLADE 8MM B8LT (ENDOMECHANICALS) ×3 IMPLANT
TROCAR XCEL NON-BLD 5MMX100MML (ENDOMECHANICALS) ×3 IMPLANT
UNDERPAD 30X36 HEAVY ABSORB (UNDERPADS AND DIAPERS) ×3 IMPLANT
WARMER LAPAROSCOPE (MISCELLANEOUS) ×3 IMPLANT

## 2024-09-24 NOTE — Anesthesia Postprocedure Evaluation (Signed)
 Anesthesia Post Note  Patient: Gina Dorsey  Procedure(s) Performed: HYSTERECTOMY, TOTAL, LAPAROSCOPIC, WITH SALPINGo-oopherectomy (Bilateral: Abdomen) CYSTOSCOPY     Patient location during evaluation: PACU Anesthesia Type: General Level of consciousness: awake and alert Pain management: pain level controlled Vital Signs Assessment: post-procedure vital signs reviewed and stable Respiratory status: spontaneous breathing, nonlabored ventilation, respiratory function stable and patient connected to nasal cannula oxygen Cardiovascular status: blood pressure returned to baseline and stable Postop Assessment: no apparent nausea or vomiting Anesthetic complications: no   No notable events documented.  Last Vitals:  Vitals:   09/24/24 0645  BP: 112/75  Pulse: 78  Resp: 16  Temp: 36.7 C  SpO2: 97%    Last Pain:  Vitals:   09/24/24 1030  TempSrc:   PainSc: 5                  Thom JONELLE Peoples

## 2024-09-24 NOTE — OR Nursing (Signed)
 Cared for patient in pacu.  Patient very anxious talked nonstop about what narcotics she needed for pain, patient was extremely anxious,demanding, very poor coping skills.  At discharge her anxiety level and pain level was reasonable for her  based on her coping skills.  States her pain score at home is around a 10-14 on 1-10 scale. She constantly states she has been in horrible pain for 14 years.  Andree Kerns rn

## 2024-09-24 NOTE — H&P (Addendum)
 OB/GYN Pre-Op History and Physical  Gina Dorsey is a 33 y.o. G0P0000 presenting for surgical management. Taking oxycodone  and morphine  and gabapentin  for pain by primary care. Is next provided with pain medications by primary care. Adamant about overnight observation for pain management. Would like to proceed with BSO in addition to hyst for definitive management.       Past Medical History:  Diagnosis Date   Anxiety state    Borderline personality disorder [facility])    followed by [facility]--- Zane Bach NP   Chronic gastritis 2011   noted on both EGD's 2011  and 03-02-2021   Chronic pelvic pain in female    Depression    Endometriosis    GERD (gastroesophageal reflux disease)    09-17-2024  pt stated occasional   Hemorrhoids    History of anal fissures 02/2021   noted on Colonoscopy perianal 03-02-2021   History of chlamydia    History of suicidal ideation    History of suicide attempt    Hypertension    dx GYN-- Dr Jeralyn---  started on clonodine (09-17-2024  pt stated prescribed TID PRN but takes twice daily, will a third if bp elevated,  checks BP at home, BP elevated probable due to pain   Irritable bowel syndrome with constipation 2008   GI-- Dr Aneita   OCD (obsessive compulsive disorder)    Opiate abuse, continuous (HCC)    Panic attacks    Polysubstance abuse (HCC)    cocaine ,  marijuana   (09-17-2024  pt stated last cocaine 2023 or 2024,  last documented +uds 04-13-2023 in epic;  and stated smokes weed occasionally   PTSD (post-traumatic stress disorder)     Past Surgical History:  Procedure Laterality Date   COLONOSCOPY WITH ESOPHAGOGASTRODUODENOSCOPY (EGD)  03/02/2021   Dr LOIS Brass   HYSTEROSCOPY WITH D & C N/A 04/29/2024   Procedure: DILATATION AND CURETTAGE;  Surgeon: Jeralyn Crutch, MD;  Location: [facility] OR;  Service: Gynecology;  Laterality: N/A;   LAPAROSCOPY N/A 04/29/2024   Procedure: LAPAROSCOPY, DIAGNOSTIC/ PERITONEAL  BIOPSIES/ LYSIS OF ADHESIONS;  Surgeon: Jeralyn Crutch, MD;  Location: [facility] OR;  Service: Gynecology;  Laterality: N/A;   PERCUTANEOUS PINNING METATARSAL FRACTURE Right 07/13/2008   @[facility]  by Dr JULIANNA. Aluisio;   I and D  Open reduction 5th Metatarsal fx  and pinning   UPPER GASTROINTESTINAL ENDOSCOPY  2011    OB History  Gravida Para Term Preterm AB Living  0 0 0 0 0 0  SAB IAB Ectopic Multiple Live Births  0 0 0 0 0    Social History   Socioeconomic History   Marital status: Single    Spouse name: Not on file   Number of children: 0   Years of education: GED   Highest education level: Not on file  Occupational History   Not on file  Tobacco Use   Smoking status: Every Day    Current packs/day: 1.00    Average packs/day: 1 pack/day for 12.0 years (12.0 ttl pk-yrs)    Types: Cigarettes    Passive exposure: Current   Smokeless tobacco: Never   Tobacco comments:    09-17-2024  pt stated smokes 1 ppd,  started age 37  Vaping Use   Vaping status: Some Days   Substances: Nicotine , Flavoring   Devices: 09-17-2024  pt stated she stopped vaping last yr 2024  however from previous surgery PAT ,  someday vapes with nicotine  documented from previously  Substance and Sexual Activity   Alcohol use: Not Currently   Drug use: Yes    Types: Cocaine, Marijuana, Other-see comments    Comment: 09-17-2024  pt stated occasional smokes weed;   stated last cocaine 2023 or 2024,  last documentation in epic +UDS 04-13-2023 for cocaine/ opiate/ cannibus   Sexual activity: Not on file  Other Topics Concern   Not on file  Social History Narrative   Originally from [place] to [employer], dropped out in 10 th grade, but did get GED   Lives by herself   Family support--Mom moved to [place]    Father still here and they are in contact.   Social Drivers of Corporate Investment Banker Strain: Not on file  Food Insecurity: Food Insecurity Present (02/12/2024)   Hunger Vital Sign    Worried  About Running Out of Food in the Last Year: Sometimes true    Ran Out of Food in the Last Year: Sometimes true  Transportation Needs: Unmet Transportation Needs (02/12/2024)   PRAPARE - Administrator, Civil Service (Medical): Yes    Lack of Transportation (Non-Medical): Yes  Physical Activity: Not on file  Stress: Not on file  Social Connections: Unknown (02/07/2023)   Received from [employer]   Social Network    Social Network: Not on file    Family History  Problem Relation Age of Onset   Diabetes Mother    COPD Father    Colon cancer Neg Hx    Rectal cancer Neg Hx    Prostate cancer Neg Hx    Esophageal cancer Neg Hx     Medications Prior to Admission  Medication Sig Dispense Refill Last Dose/Taking   atorvastatin (LIPITOR) 10 MG tablet Take 10 mg by mouth daily.   09/24/2024 at  4:00 AM   Calcium Carb-Cholecalciferol (CALCIUM + D3 PO) Take 1 tablet by mouth at bedtime. 800 mg calcium/  600 international units d3   09/23/2024   cloNIDine  (CATAPRES ) 0.1 MG tablet TAKE 1 TABLET(0.1 MG) BY MOUTH THREE TIMES DAILY AS NEEDED (Patient taking differently: Take 0.1 mg by mouth 3 (three) times daily as needed. Pt stated per Dr Jeralyn for Hypertension probable due to pain,  taking regularly twice daily and  will take a third if needed, checks BP at home) 90 tablet 2 09/24/2024 at  4:00 AM   dicyclomine  (BENTYL ) 20 MG tablet Take 1 tablet (20 mg total) by mouth 2 (two) times daily. (Patient taking differently: Take 20 mg by mouth 2 (two) times daily.) 60 tablet 11 09/24/2024 at  4:00 AM   Elagolix Sodium  (ORILISSA ) 150 MG TABS Take 1 tablet (150 mg total) by mouth daily. (Patient taking differently: Take 1 tablet by mouth daily.) 30 tablet 6 09/24/2024 at  4:00 AM   famotidine  (PEPCID ) 20 MG tablet Take 20 mg by mouth daily as needed for heartburn or indigestion.   09/24/2024 at  4:00 AM   FLUoxetine  (PROZAC ) 20 MG capsule Take 1 capsule (20 mg total) by mouth daily. (Patient  taking differently: Take 20 mg by mouth daily.) 30 capsule 3 09/24/2024 at  4:00 AM   gabapentin  (NEURONTIN ) 300 MG capsule Take 1 capsule (300 mg total) by mouth 3 (three) times daily. (Patient taking differently: Take 300 mg by mouth 3 (three) times daily.) 90 capsule 3 09/24/2024 at  4:00 AM   hydrOXYzine  (ATARAX ) 25 MG tablet Take 1 tablet (25 mg total) by mouth 3 (three) times daily as needed  for anxiety. (Patient taking differently: Take 25 mg by mouth 3 (three) times daily as needed for anxiety.) 90 tablet 3 09/24/2024 at  4:00 AM   ibuprofen  (ADVIL ) 600 MG tablet Take 1 tablet (600 mg total) by mouth every 8 (eight) hours as needed (pain). 15 tablet 0 09/23/2024   Multiple Vitamins-Minerals (MULTIVITAMIN WITH MINERALS) tablet Take 1 tablet by mouth daily.   Past Month   ondansetron  (ZOFRAN -ODT) 8 MG disintegrating tablet Take 1 tablet (8 mg total) by mouth every 8 (eight) hours as needed for nausea, vomiting or refractory nausea / vomiting. 15 tablet 0 09/24/2024 at  4:00 AM   oxyCODONE  (OXY IR/ROXICODONE ) 5 MG immediate release tablet Take 1 tablet (5 mg total) by mouth every 6 (six) hours as needed for breakthrough pain. 6 tablet 0 Past Month   Probiotic Product (PROBIOTIC DAILY) CAPS Take 1 capsule by mouth daily.   09/23/2024   traZODone  (DESYREL ) 100 MG tablet Take 1 tablet (100 mg total) by mouth at bedtime as needed for sleep. (Patient taking differently: Take 100 mg by mouth at bedtime as needed for sleep.) 30 tablet 3 09/23/2024   norethindrone  (AYGESTIN ) 5 MG tablet Take 1 tablet (5 mg total) by mouth daily. (Patient not taking: Reported on 09/17/2024) 30 tablet 2 Unknown    Allergies  Allergen Reactions   Bactrim  [Sulfamethoxazole -Trimethoprim ] Anaphylaxis and Hives   Fish Allergy Shortness Of Breath    PER PT ALL FISH (INGESTED) salt and fresh water  Had to use EpiPen  and go to hospital   Guaifenesin & Derivatives Anaphylaxis and Hives   Latex Hives, Itching and Other (See  Comments)    Burning, also   Macrobid  [Nitrofurantoin ] Hives and Other (See Comments)    Required the use of her epipen    Reglan  [Metoclopramide ] Itching   Shellfish Allergy Shortness Of Breath    Had to use EpiPen  and to to hospital   Sulfa  Antibiotics Anaphylaxis and Hives   Bupropion Nausea And Vomiting   Citalopram  Hydrobromide Hives    Review of Systems: Negative except for what is mentioned in HPI.     Physical Exam: BP 112/75   Pulse 78   Temp 98.1 F (36.7 C) (Oral)   Resp 16   Ht 5' 4 (1.626 m)   Wt 74.8 kg   LMP 05/08/2024 (Approximate) Comment: 09-17-2024  pt stated since taking orlissa LMP 06 or 07/ 2025  SpO2 97%   BMI 28.32 kg/m  CONSTITUTIONAL: Well-developed, well-nourished and in no acute distress.  HENT:  Normocephalic, atraumatic, External right and left ear normal. Oropharynx is clear and moist EYES: Conjunctivae and EOM are normal. Pupils are equal, round, and reactive to light. No scleral icterus.  NECK: Normal range of motion, supple, no masses SKIN: Skin is warm and dry. No rash noted. Not diaphoretic. No erythema. No pallor. NEUROLGIC: Alert and oriented to person, place, and time. Normal reflexes, muscle tone coordination. No cranial nerve deficit noted. PSYCHIATRIC: Normal mood and affect. Normal behavior. Normal judgment and thought content. RESPIRATORY: Normal effort PELVIC: Deferred   Pertinent Labs/Studies:   Results for orders placed or performed during the hospital encounter of 09/24/24 (from the past 72 hours)  Rapid Urine Drug Screen (hospital performed - Not at [facility])     Status: Abnormal   Collection Time: 09/24/24  5:45 AM  Result Value Ref Range   Opiates POSITIVE (A) NONE DETECTED   Cocaine NONE DETECTED NONE DETECTED   Benzodiazepines NONE DETECTED NONE DETECTED   Amphetamines NONE  DETECTED NONE DETECTED   Tetrahydrocannabinol POSITIVE (A) NONE DETECTED   Barbiturates NONE DETECTED NONE DETECTED    Comment: (NOTE) DRUG  SCREEN FOR MEDICAL PURPOSES ONLY.  IF CONFIRMATION IS NEEDED FOR ANY PURPOSE, NOTIFY LAB WITHIN 5 DAYS.  LOWEST DETECTABLE LIMITS FOR URINE DRUG SCREEN Drug Class                     Cutoff (ng/mL) Amphetamine and metabolites    1000 Barbiturate and metabolites    200 Benzodiazepine                 200 Opiates and metabolites        300 Cocaine and metabolites        300 THC                            50 Performed at [facility], [address], [place], [place] [address]   Pregnancy, urine POC     Status: None   Collection Time: 09/24/24  5:53 AM  Result Value Ref Range   Preg Test, Ur NEGATIVE NEGATIVE    Comment:        THE SENSITIVITY OF THIS METHODOLOGY IS >20 mIU/mL.        Assessment and Plan :ANNISE BORAN is a 33 y.o. G0P0000 here for definitive surgical management of endometriosis, dysmenorrhea, chronic pelvic pain and PMDD.   Patient desires surgical management with definitive surgical management of AUB.  The risks of surgery were discussed in detail with the patient including but not limited to: bleeding which may require transfusion or reoperation; infection which may require prolonged hospitalization or re-hospitalization and antibiotic therapy; injury to bowel, bladder, ureters and major vessels or other surrounding organs which may lead to other procedures; formation of adhesions; need for additional procedures including laparotomy or subsequent procedures secondary to intraoperative injury or abnormal pathology; thromboembolic phenomenon; incisional problems and other postoperative or anesthesia complications.  Patient was told that the likelihood that her condition and symptoms will be treated effectively with this surgical management was moderate to high; the postoperative expectations were also discussed in detail. The patient also understands the alternative treatment options which were discussed in full. All questions were answered.  GORMAN Carter Quarry, M.D. Minimally Invasive Gynecologic Surgery and Pelvic Pain Specialist Attending Obstetrician & Gynecologist, [facility] [employer], [facility]

## 2024-09-24 NOTE — Discharge Instructions (Signed)
 Post Op Hysterectomy Instructions Please read the instructions below. Refer to these instructions for the next few weeks. These instructions provide you with general information on caring for yourself after surgery. Your caregiver may also give you specific instructions. While your treatment has been planned according to the most current medical practices available, unavoidable problems sometimes happen. If you have any problems or questions after you leave, please call your caregiver.  HOME CARE INSTRUCTIONS Healing will take time. You will have discomfort, tenderness, swelling and bruising at the operative site for a couple of weeks. This is normal and will get better as time goes on.  Only take over-the-counter or prescription medicines for pain, discomfort or fever as directed by your caregiver.  Do not take aspirin. It can cause bleeding.  Do not drive when taking pain medication.  Follow your caregiver's advice regarding diet, exercise, lifting, driving and general activities.  Resume your usual diet as directed and allowed.  Get plenty of rest and sleep.  Do not douche, use tampons, or have sexual intercourse until your caregiver gives you permission. .  Take your temperature if you feel hot or flushed.  You may shower today when you get home.  No tub bath for one week.   Do not drink alcohol until you are not taking any narcotic pain medications.  Try to have someone home with you for a week or two to help with the household activities.   Be careful over the next two to three weeks with any activities at home that involve lifting, pushing, or pulling.  Listen to your body--if something feels uncomfortable to do, then don't do it. Make sure you and your family understands everything about your operation and recovery.  Walking up stairs is fine. Do not sign any legal documents until you feel normal again.  Keep all your follow-up appointments as recommended by your caregiver.   PLEASE CALL  THE OFFICE IF: There is swelling, redness or increasing pain in the wound area.  Pus is coming from the wound.  You notice a bad smell from the wound or surgical dressing.  You have pain, redness and swelling from the intravenous site.  The wound is breaking open (the edges are not staying together).   You develop pain or bleeding when you urinate.  You develop abnormal vaginal discharge.  You have any type of abnormal reaction or develop an allergy to your medication.  You need stronger pain medication for your pain   SEEK IMMEDIATE MEDICAL CARE: You develop a temperature of 100.5 or higher.  You develop abdominal pain.  You develop chest pain.  You develop shortness of breath.  You pass out.  You develop pain, swelling or redness of your leg.  You develop heavy vaginal bleeding with or without blood clots.   MEDICATIONS: Restart your regular medications BUT wait one week before restarting all vitamins and mineral supplements Use Motrin  800mg  every 8 hours for the next several days.   Take Tylenol  1000mg  every 8 hours for the next several days Use oxycodone  5 mg every 4-6 hours. Taking motrin  and tylenol  should help reduce how often you use oxycodone .  You can take up to 600 mg of gabapentin , three times a day as needed for pain You can take the muscle relaxer up to three times a day. Avoid taking the oxycodone , gabapentin , and methocarbamol  at the same time  You may use an over the counter stool softener like Colace or Dulcolax to help with starting  a bowel movement.  You can also use miralax (polyethylene glycol). Start the day after you go home.  Warm liquids, fluids, and ambulation help too.  If you have not had a bowel movement in four days, you need to call the office.

## 2024-09-24 NOTE — Anesthesia Procedure Notes (Signed)
 Procedure Name: Intubation Date/Time: 09/24/2024 7:55 AM  Performed by: Viviana Almarie DASEN, CRNAPre-anesthesia Checklist: Patient identified, Emergency Drugs available, Suction available and Patient being monitored Patient Re-evaluated:Patient Re-evaluated prior to induction Oxygen Delivery Method: Circle System Utilized Preoxygenation: Pre-oxygenation with 100% oxygen Induction Type: IV induction Ventilation: Mask ventilation without difficulty Grade View: Grade I Tube type: Oral Tube size: 6.5 mm Number of attempts: 1 Airway Equipment and Method: Stylet Placement Confirmation: ETT inserted through vocal cords under direct vision, positive ETCO2 and breath sounds checked- equal and bilateral Secured at: 21 cm Tube secured with: Tape Dental Injury: Teeth and Oropharynx as per pre-operative assessment

## 2024-09-24 NOTE — Brief Op Note (Signed)
 09/24/2024  9:45 AM  PATIENT:  Heinz LITTIE Starks  33 y.o. female  PRE-OPERATIVE DIAGNOSIS:  Dysmenorrhea Endometriosis  POST-OPERATIVE DIAGNOSIS:  DysmenorrheaEndometriosis  PROCEDURE:  Procedure(s) with comments: HYSTERECTOMY, TOTAL, LAPAROSCOPIC, WITH SALPINGo-oopherectomy (Bilateral) - TLH and Cystoscopy - possible bilateral oophrectomy CYSTOSCOPY (N/A)  SURGEON:  Surgeons and Role:    DEWAINE Jeralyn Crutch, MD - Primary    * Abigail Rollo DASEN, MD - Assisting  PHYSICIAN ASSISTANT: n/a  ASSISTANTS: none   ANESTHESIA:   general and TAP block  EBL:  10 mL   BLOOD ADMINISTERED:none  DRAINS: none   LOCAL MEDICATIONS USED:  BUPIVICAINE  and OTHER Exparel   SPECIMEN:  Source of Specimen:  uterus, cervix, bilateral fallopian tubes and ovaries  DISPOSITION OF SPECIMEN:  PATHOLOGY  COUNTS:  YES  TOURNIQUET:  * No tourniquets in log *  DICTATION: .Note written in EPIC  PLAN OF CARE: Admit for overnight observation  PATIENT DISPOSITION:  PACU - hemodynamically stable.   Delay start of Pharmacological VTE agent (>24hrs) due to surgical blood loss or risk of bleeding: not applicable

## 2024-09-24 NOTE — Op Note (Signed)
 Heinz LITTIE Starks PROCEDURE DATE: 09/24/2024  PREOPERATIVE DIAGNOSIS: endometriosis, pelvic pain, PMDD  POSTOPERATIVE DIAGNOSIS: endometriosis, pelvic pain, PMDD PROCEDURE:    total laparoscopic bilateral salpingo-oophorectomy, cystoscopy SURGEON: Carter Quarry, MD ASSISTANT:  Rollo Bring, MD    An experienced assistant was required given the standard of surgical care given the complexity of the case.  This assistant was needed for exposure, dissection, suctioning, retraction, instrument exchange, and for overall help during the procedure.  INDICATIONS: 33 y.o. G0P0000 with pelvic pain, endometriosis and PMDD.  Risks of surgery were discussed with the patient including but not limited to: bleeding which may require transfusion; infection which may require antibiotics; injury to surrounding organs; need for additional procedures including laparotomy;  and other postoperative/anesthesia complications. Written informed consent was obtained.    FINDINGS:  Normal external genitalia, 8 wk size mobile uterus with Normal contours.  Laparoscopically: normal upper abdominal survey, normal sized uterus with 1cm bulge at adnexa, normal bilateral fallopian tubes, normal bilateral ovaries, bilateral ureters seen, normal anterior cul de sac, normal posterior cul de sac with healed biopsy scars, right abdominal wall with healed scar from prior adhesion Cystoscopically: normal bladder wall without apparent injury, bilateral ureteral orifices, urine from bilateral ureteral orifices   ANESTHESIA: General,  INTRAVENOUS FLUIDS:  1200 ml of LR ESTIMATED BLOOD LOSS:  10 ml URINE OUTPUT: 200 ml SPECIMENS: uterus, cervix, bilateral fallopian tubes and ovaries COMPLICATIONS:  None immediate.  PROCEDURE: The risks, benefits, and alternatives of surgery were explained, understood, and accepted. Consents were signed. All questions were answered. She was taken to the operating room and general anesthesia was applied  without complication. She was placed in the dorsal lithotomy position and her abdomen and vagina were prepped and draped after she had been carefully positioned on the table. A bimanual exam revealed a 8 week size uterus that was mobile. Her adnexa were not enlarged. A Foley catheter was placed and it drained clear throughout the case. A speculum was placed and the cervix visualized. The cervix was measured and the uterus was sounded to 7 cm. A Rumi uterine manipulator using a 3cm cup and 6cm tip was placed without difficulty.  Gloves were changed and attention was turned to the abdomen. All incisions were infiltrated with local anesthetic. A 5mm incision was made in the umbilicus and an optiview trocar was introduced into the abdomen. The Entry was confirmed with low opening intraabdominal pressure and visualization and the abdomen was then insufflated. After good pneumoperitoneum was established, the abdomen was surveyed including the upper abdomen. She was placed in Trendelenburg position and ports were placed in the RLQ, LLQ, and an 8mm airseal trocar in the RUQ.   The right round ligament was divided and the peritoneum parallel to the IP as divided and the ureter visualized. The peritoneum superior to the ureter and inferior to the IP was incised and the defect widened. The IP ligament was cauterized and divided. mesosalpinx was serially cauterized and cut and removed from the abdomen. The leaves of the broad ligament were separated and the anterior leaf dissected down to the level of the Koh ring. The posterior leaf was dissected and the uterine vessels were skeletonized. The uterine vessels were cauterized, divided, and lateralized to the cup edge. Attention was turned to the left side where the IP ligament and ureter were identified and the IP ligament cauterized and divided. The round ligament was divided and the leaves of the broad separated. The posterior leaf was divided and the anterior leaf  was  divided to further develop the bladder flap. The uterine vessels were cauterized, divided, and lateralized to the cup edge. The bladder was pushed from the operative site and the tissue dissected down to the pubocervical fascia. Once adequately mobilized, the colpotomy was completed using harmonic scalpel device and the specimen removed from the pelvis.    The vaginal cuff was then closed using 0 Vloc in a running fashion. The pelvis was irrigated. All pedicles were inspected. No bleeding was noted.   CO2 pressures were lowered to 8mm Hg.  Again, no bleeding was noted.  Ureters were noted deep in the pelvis to be peristalsing.  At this point the procedure was completed.  The remaining instruments were removed.  The patient was taken out of Trendelenburg positioning.    Attention was then turned the vagina and the cuff was inspected. No bleeding was noted. The Foley catheter was removed.  Cystoscopy was performed.  No sutures or bladder injuries were noted.  Ureters were noted with efflux of urine from bilateral ureteral orifices.  Foley was left out after the cystoscopic fluid was drained and cystoscope removed.  The skin was then closed with subcuticular stitches of 4-0 Vicryl. The skin was cleansed Dermabond was applied. Sponge, lap, needle, instrument counts were correct x2. Patient tolerated the procedure very well. She was awakened from anesthesia, extubated and taken to recovery in stable condition.    Carter Quarry, MD Minimally Invasive Gynecologic Surgery  Obstetrics and Gynecology, [facility] for [facility], [facility] 09/24/2024

## 2024-09-24 NOTE — Transfer of Care (Signed)
 Immediate Anesthesia Transfer of Care Note  Patient: Gina Dorsey  Procedure(s) Performed: HYSTERECTOMY, TOTAL, LAPAROSCOPIC, WITH SALPINGo-oopherectomy (Bilateral: Abdomen) CYSTOSCOPY  Patient Location: PACU  Anesthesia Type:General  Level of Consciousness: drowsy and patient cooperative  Airway & Oxygen Therapy: Patient Spontanous Breathing  Post-op Assessment: Report given to RN, Post -op Vital signs reviewed and stable, and Patient moving all extremities X 4  Post vital signs: Reviewed and stable  Last Vitals:  Vitals Value Taken Time  BP 158/100 09/24/24 10:00  Temp 97.6   Pulse 56 09/24/24 10:04  Resp 14 09/24/24 10:04  SpO2 90 % 09/24/24 10:04  Vitals shown include unfiled device data.  Last Pain:  Vitals:   09/24/24 0645  TempSrc: Oral  PainSc: 4       Patients Stated Pain Goal: 4 (09/24/24 0645)  Complications: No notable events documented.

## 2024-09-24 NOTE — Progress Notes (Addendum)
 SUBJECTIVE: patient reports feeling tired and requesting medication for pain and sleep. States previously (ED visit) was receiving morphine  for pain control q20 minutes for her endometriosis related pain. Emphasizes that she has been in pain for 14 years and has a high tolerance to opioids and experiencing significant pain. Patient additionally reports relief with muscle relaxer (robaxin ) previously.   OBJECTIVE:  Vitals:   09/24/24 0645  BP: 112/75  Pulse: 78  Resp: 16  Temp: 98.1 F (36.7 C)  SpO2: 97%    Gen: intermittently tearful, vacillates between anger and sadness    A/P - add on robaxin  for pain control  - ativan  x1 for anxiety, will continue with atarax  and clonidine  going forward - re-emphasized postoperative pain expectations and general inpatient expectations  - regular diet - LR x1 day; tolerating po - voided prior to transfer - bowel regimen for chronic constipation  - s/p uncomplicated TLH, BSO, cysto   Carter Quarry, MD, FACOG Minimally Invasive Gynecologic Surgery  Obstetrics and Gynecology, [facility] for [facility], [facility] 09/24/2024

## 2024-09-25 ENCOUNTER — Encounter (HOSPITAL_COMMUNITY): Payer: Self-pay | Admitting: Obstetrics and Gynecology

## 2024-09-27 LAB — SURGICAL PATHOLOGY

## 2024-09-30 ENCOUNTER — Ambulatory Visit: Payer: Self-pay | Admitting: Obstetrics and Gynecology

## 2024-10-16 ENCOUNTER — Telehealth: Payer: MEDICAID | Admitting: Obstetrics and Gynecology

## 2024-10-16 DIAGNOSIS — Z09 Encounter for follow-up examination after completed treatment for conditions other than malignant neoplasm: Secondary | ICD-10-CM

## 2024-10-16 DIAGNOSIS — R110 Nausea: Secondary | ICD-10-CM

## 2024-10-16 DIAGNOSIS — G8918 Other acute postprocedural pain: Secondary | ICD-10-CM

## 2024-10-16 MED ORDER — CLONIDINE HCL 0.1 MG PO TABS: 0.1000 mg | ORAL_TABLET | Freq: Two times a day (BID) | ORAL | 2 refills | Status: AC | PRN

## 2024-10-16 MED ORDER — ONDANSETRON 8 MG PO TBDP: 8.0000 mg | ORAL_TABLET | Freq: Three times a day (TID) | ORAL | 0 refills | Status: DC | PRN

## 2024-10-16 NOTE — Progress Notes (Signed)
 GYNECOLOGY VIRTUAL VISIT ENCOUNTER NOTE  Provider location: Center for [facility]   Patient location: Home  I connected with Gina Dorsey on 10/16/24 at  2:15 PM EST by MyChart Video Encounter and verified that I am speaking with the correct person using two identifiers.   I discussed the limitations, risks, security and privacy concerns of performing an evaluation and management service virtually and the availability of in person appointments. I also discussed with the patient that there may be a patient responsible charge related to this service. The patient expressed understanding and agreed to proceed.   History:  Gina Dorsey is a 33 y.o. G0P0000 female being evaluated today for postop. No vaginal bleeding and has not had intercourse. Gas pain in the morning and will take tylenol  or ibuprofen . Clonidine  helps and taking zofran  for prn nausa.      Past Medical History:  Diagnosis Date   Anxiety state    Borderline personality disorder [facility])    followed by [facility]--- Zane Bach NP   Chronic gastritis 2011   noted on both EGD's 2011  and 03-02-2021   Chronic pelvic pain in female    Depression    Endometriosis    GERD (gastroesophageal reflux disease)    09-17-2024  pt stated occasional   Hemorrhoids    History of anal fissures 02/2021   noted on Colonoscopy perianal 03-02-2021   History of chlamydia    History of suicidal ideation    History of suicide attempt    Hypertension    dx GYN-- Dr Jeralyn---  started on clonodine (09-17-2024  pt stated prescribed TID PRN but takes twice daily, will a third if bp elevated,  checks BP at home, BP elevated probable due to pain   Irritable bowel syndrome with constipation 2008   GI-- Dr Aneita   OCD (obsessive compulsive disorder)    Opiate abuse, continuous (HCC)    Panic attacks    Polysubstance abuse (HCC)    cocaine ,  marijuana   (09-17-2024  pt stated last cocaine  2023 or 2024,  last documented +uds 04-13-2023 in epic;  and stated smokes weed occasionally   PTSD (post-traumatic stress disorder)    Past Surgical History:  Procedure Laterality Date   COLONOSCOPY WITH ESOPHAGOGASTRODUODENOSCOPY (EGD)  03/02/2021   Dr LOIS Brass   CYSTOSCOPY N/A 09/24/2024   Procedure: PHYLLIS;  Surgeon: Jeralyn Crutch, MD;  Location: [facility] OR;  Service: Gynecology;  Laterality: N/A;   HYSTEROSCOPY WITH D & C N/A 04/29/2024   Procedure: DILATATION AND CURETTAGE;  Surgeon: Jeralyn Crutch, MD;  Location: [facility] OR;  Service: Gynecology;  Laterality: N/A;   LAPAROSCOPY N/A 04/29/2024   Procedure: LAPAROSCOPY, DIAGNOSTIC/ PERITONEAL BIOPSIES/ LYSIS OF ADHESIONS;  Surgeon: Jeralyn Crutch, MD;  Location: [facility] OR;  Service: Gynecology;  Laterality: N/A;   PERCUTANEOUS PINNING METATARSAL FRACTURE Right 07/13/2008   @[facility]  by Dr JULIANNA. Aluisio;   I and D  Open reduction 5th Metatarsal fx  and pinning   TOTAL LAPAROSCOPIC HYSTERECTOMY WITH SALPINGECTOMY Bilateral 09/24/2024   Procedure: HYSTERECTOMY, TOTAL, LAPAROSCOPIC, WITH SALPINGo-oopherectomy;  Surgeon: Jeralyn Crutch, MD;  Location: [facility] OR;  Service: Gynecology;  Laterality: Bilateral;  TLH and Cystoscopy - possible bilateral oophrectomy   UPPER GASTROINTESTINAL ENDOSCOPY  2011   The following portions of the patient's history were reviewed and updated as appropriate: allergies, current medications, past family history, past medical history, past social history, past surgical history and problem  list.     Review of Systems:  Pertinent items noted in HPI and remainder of comprehensive ROS otherwise negative.  Physical Exam:   General:  Alert, oriented and cooperative. Patient appears to be in no acute distress.  Mental Status: Normal mood and affect. Normal behavior. Normal judgment and thought content.   Respiratory: Normal respiratory effort, no problems with respiration noted  Rest of physical exam deferred due to  type of encounter  Labs and Imaging FINAL MICROSCOPIC DIAGNOSIS:   A. UTERUS AND CERVIX, WITH BILATERAL FALLOPIAN TUBES AND OVARIES,  HYSTERECTOMY:  - Cervix: Benign, nabothian cysts  - Endometrium: Benign, no hyperplasia or malignancy identified  - Myometrium: Benign, focal areas of hemorrhage  - Right ovary: Benign follicular cyst  - Bilateral fallopian tubes: Benign, paratubal cysts        Assessment and Plan:     1. Postop check (Primary) Doing well. Reviewed pathology and intra-op findings. Reviewed postop restrictions   2. Postoperative pain Refill of clonidine  sent for postop pain control  - cloNIDine  (CATAPRES ) 0.1 MG tablet; Take 1 tablet (0.1 mg total) by mouth 2 (two) times daily as needed.  Dispense: 90 tablet; Refill: 2  3. Nausea Zofran  sent for nausea - ondansetron  (ZOFRAN -ODT) 8 MG disintegrating tablet; Take 1 tablet (8 mg total) by mouth every 8 (eight) hours as needed for nausea, vomiting or refractory nausea / vomiting.  Dispense: 30 tablet; Refill: 0       I discussed the assessment and treatment plan with the patient. The patient was provided an opportunity to ask questions and all were answered. The patient agreed with the plan and demonstrated an understanding of the instructions.   The patient was advised to call back or seek an in-person evaluation/go to the ED if the symptoms worsen or if the condition fails to improve as anticipated.  I provided 10 minutes of face-to-face time during this encounter. I also spent 10 minutes dedicated to the care of this patient including pre-visit review of records, post visit ordering of medications and appropriate tests or procedures, coordinating care and documenting this visit encounter.    Carter Quarry, MD Center for [employer], [facility]

## 2024-10-17 ENCOUNTER — Other Ambulatory Visit: Payer: Self-pay

## 2024-10-17 NOTE — Telephone Encounter (Signed)
 Pt requests to have an Rx for nicotine  patch.  Message routed to Dr. Jeralyn for advisment.   Berenize Gatlin,RN 10/17/24

## 2024-10-18 MED ORDER — NICOTINE 21 MG/24HR TD PT24: 21.0000 mg | MEDICATED_PATCH | Freq: Every day | TRANSDERMAL | 0 refills | Status: AC

## 2024-10-21 ENCOUNTER — Encounter: Payer: Self-pay | Admitting: Medical

## 2024-11-01 ENCOUNTER — Encounter (HOSPITAL_COMMUNITY): Payer: Self-pay | Admitting: Psychiatry

## 2024-11-01 ENCOUNTER — Telehealth (INDEPENDENT_AMBULATORY_CARE_PROVIDER_SITE_OTHER): Payer: MEDICAID | Admitting: Psychiatry

## 2024-11-01 DIAGNOSIS — F603 Borderline personality disorder: Secondary | ICD-10-CM

## 2024-11-01 DIAGNOSIS — F411 Generalized anxiety disorder: Secondary | ICD-10-CM

## 2024-11-01 MED ORDER — HYDROXYZINE HCL 25 MG PO TABS: 25.0000 mg | ORAL_TABLET | Freq: Three times a day (TID) | ORAL | 3 refills | Status: AC | PRN

## 2024-11-01 MED ORDER — OLANZAPINE 7.5 MG PO TABS: 7.5000 mg | ORAL_TABLET | Freq: Every day | ORAL | 3 refills | Status: AC

## 2024-11-01 MED ORDER — FLUOXETINE HCL 20 MG PO CAPS: 20.0000 mg | ORAL_CAPSULE | Freq: Every day | ORAL | 3 refills | Status: AC

## 2024-11-01 MED ORDER — TRAZODONE HCL 100 MG PO TABS: 100.0000 mg | ORAL_TABLET | Freq: Every evening | ORAL | 3 refills | Status: AC | PRN

## 2024-11-01 MED ORDER — GABAPENTIN 300 MG PO CAPS: 300.0000 mg | ORAL_CAPSULE | Freq: Three times a day (TID) | ORAL | 3 refills | Status: AC

## 2024-11-01 NOTE — Progress Notes (Signed)
 [facility] MD/PA/NP OP Progress Note Virtual Visit via Video Note  I connected with Gina Dorsey on 10/06/2023 at 10:30 AM EST by a video enabled telemedicine application and verified that I am speaking with the correct person using two identifiers.  Location: Patient: Home Provider: Clinic   I discussed the limitations of evaluation and management by telemedicine and the availability of in person appointments. The patient expressed understanding and agreed to proceed.  I provided 30 minutes of non-face-to-face time during this encounter.   11/01/2024 11:02 AM Gina Dorsey  MRN:  [id number]  Chief Complaint: I have no concerns  HPI:  33 year old female seen today for follow up psychiatric evaluation.   She has a psychiatric history of borderline personality disorder, anxiety, depression, panic attacks, polysubstance use (marijuana and cocaine), and SI/SA.  Currently she is managed on Zyprexa  7.5 mg nightly, hydroxyzine  25 mg 3 times daily as needed, gabapentin  300 mg 3 times daily, Prozac  20 mg daily, and trazodone  100 mg nightly as needed.  She informed clinical research associate that her medications are effective in managing her psychiatric conditions.  Today patient was well-groomed, pleasant, cooperative, and engaged in conversation.  She informed clinical research associate that she has no concerns.  She reports that she has had a hysterectomy and currently has no more abdominal pain.  Patient notes that overall her mood is stable and reports that she has minimal anxiety and depression.  Today provider conducted GAD-7 and patient scored a 3, at her last visit she scored a 2.  Provider also conducted PHQ-9 patient scored a 4, last visit she scored a 3.Today she denies SI/HI/VAH or paranoia.  She endorses adequate sleep and appetite.    No medication changes made today. Patient agreeable to continue medications as prescribed. No other concerns noted at this time.     Visit Diagnosis:    ICD-10-CM   1. Anxiety state  F41.1  gabapentin  (NEURONTIN ) 300 MG capsule    hydrOXYzine  (ATARAX ) 25 MG tablet    OLANZapine  (ZYPREXA ) 7.5 MG tablet    2. Borderline personality disorder (HCC)  F60.3 traZODone  (DESYREL ) 100 MG tablet    FLUoxetine  (PROZAC ) 20 MG capsule    OLANZapine  (ZYPREXA ) 7.5 MG tablet            Past Psychiatric History: borderline personality disorder, anxiety, depression, panic attacks, polysubstance use (marijuana and cocaine), and SI/SA.  Past Medical History:  Past Medical History:  Diagnosis Date   Anxiety state    Borderline personality disorder [facility])    followed by [facility]--- Zane Bach NP   Chronic gastritis 2011   noted on both EGD's 2011  and 03-02-2021   Chronic pelvic pain in female    Depression    Endometriosis    GERD (gastroesophageal reflux disease)    09-17-2024  pt stated occasional   Hemorrhoids    History of anal fissures 02/2021   noted on Colonoscopy perianal 03-02-2021   History of chlamydia    History of suicidal ideation    History of suicide attempt    Hypertension    dx GYN-- Dr Jeralyn---  started on clonodine (09-17-2024  pt stated prescribed TID PRN but takes twice daily, will a third if bp elevated,  checks BP at home, BP elevated probable due to pain   Irritable bowel syndrome with constipation 2008   GI-- Dr Aneita   OCD (obsessive compulsive disorder)    Opiate abuse, continuous (HCC)    Panic attacks  Polysubstance abuse (HCC)    cocaine ,  marijuana   (09-17-2024  pt stated last cocaine 2023 or 2024,  last documented +uds 04-13-2023 in epic;  and stated smokes weed occasionally   PTSD (post-traumatic stress disorder)     Past Surgical History:  Procedure Laterality Date   COLONOSCOPY WITH ESOPHAGOGASTRODUODENOSCOPY (EGD)  03/02/2021   Dr LOIS Brass   CYSTOSCOPY N/A 09/24/2024   Procedure: CYSTOSCOPY;  Surgeon: Jeralyn Crutch, MD;  [facility];  Service: Gynecology;  Laterality: N/A;   HYSTEROSCOPY WITH D  & C N/A 04/29/2024   Procedure: DILATATION AND CURETTAGE;  Surgeon: Jeralyn Crutch, MD;  [facility];  Service: Gynecology;  Laterality: N/A;   LAPAROSCOPY N/A 04/29/2024   Procedure: LAPAROSCOPY, DIAGNOSTIC/ PERITONEAL BIOPSIES/ LYSIS OF ADHESIONS;  Surgeon: Jeralyn Crutch, MD;  [facility];  Service: Gynecology;  Laterality: N/A;   PERCUTANEOUS PINNING METATARSAL FRACTURE Right 07/13/2008   @[facility]  by Dr JULIANNA. Aluisio;   I and D  Open reduction 5th Metatarsal fx  and pinning   TOTAL LAPAROSCOPIC HYSTERECTOMY WITH SALPINGECTOMY Bilateral 09/24/2024   Procedure: HYSTERECTOMY, TOTAL, LAPAROSCOPIC, WITH SALPINGo-oopherectomy;  Surgeon: Jeralyn Crutch, MD;  [facility];  Service: Gynecology;  Laterality: Bilateral;  TLH and Cystoscopy - possible bilateral oophrectomy   UPPER GASTROINTESTINAL ENDOSCOPY  2011    Family Psychiatric History: Mother personality disorder, father alcohol use, paternal grandfather alcohol use   Family History:  Family History  Problem Relation Age of Onset   Diabetes Mother    COPD Father    Colon cancer Neg Hx    Rectal cancer Neg Hx    Prostate cancer Neg Hx    Esophageal cancer Neg Hx     Social History:  Social History   Socioeconomic History   Marital status: Single    Spouse name: Not on file   Number of children: 0   Years of education: GED   Highest education level: Not on file  Occupational History   Not on file  Tobacco Use   Smoking status: Every Day    Current packs/day: 1.00    Average packs/day: 1 pack/day for 12.0 years (12.0 ttl pk-yrs)    Types: Cigarettes    Passive exposure: Current   Smokeless tobacco: Never   Tobacco comments:    09-17-2024  pt stated smokes 1 ppd,  started age 62  Vaping Use   Vaping status: Some Days   Substances: Nicotine , Flavoring   Devices: 09-17-2024  pt stated she stopped vaping last yr 2024  however from previous surgery PAT ,  someday vapes with nicotine  documented from previously   Substance and Sexual Activity   Alcohol use: Not Currently   Drug use: Yes    Types: Cocaine, Marijuana, Other-see comments    Comment: 09-17-2024  pt stated occasional smokes weed;   stated last cocaine 2023 or 2024,  last documentation in epic +UDS 04-13-2023 for cocaine/ opiate/ cannibus   Sexual activity: Not on file  Other Topics Concern   Not on file  Social History Narrative   Originally from [place] to [employer], dropped out in 10 th grade, but did get GED   Lives by herself   Family support--Mom moved to [place]    Father still here and they are in contact.   Social Drivers of Corporate Investment Banker Strain: Not on file  Food Insecurity: Food Insecurity Present (02/12/2024)   Hunger Vital Sign    Worried About Running Out of Food in  the Last Year: Sometimes true    Ran Out of Food in the Last Year: Sometimes true  Transportation Needs: Unmet Transportation Needs (02/12/2024)   PRAPARE - Administrator, Civil Service (Medical): Yes    Lack of Transportation (Non-Medical): Yes  Physical Activity: Not on file  Stress: Not on file  Social Connections: Unknown (02/07/2023)   Received from [facility]   Social Network    Social Network: Not on file    Allergies:  Allergies  Allergen Reactions   Bactrim  [Sulfamethoxazole -Trimethoprim ] Anaphylaxis and Hives   Fish Allergy Shortness Of Breath    PER PT ALL FISH (INGESTED) salt and fresh water  Had to use EpiPen  and go to hospital   Guaifenesin & Derivatives Anaphylaxis and Hives   Latex Hives, Itching and Other (See Comments)    Burning, also   Macrobid  [Nitrofurantoin ] Hives and Other (See Comments)    Required the use of her epipen    Reglan  [Metoclopramide ] Itching   Shellfish Allergy Shortness Of Breath    Had to use EpiPen  and to to hospital   Sulfa  Antibiotics Anaphylaxis and Hives   Bupropion Nausea And Vomiting   Citalopram  Hydrobromide Hives    Metabolic Disorder Labs: Lab  Results  Component Value Date   HGBA1C 5.3 04/15/2023   MPG 105.41 04/15/2023   No results found for: PROLACTIN Lab Results  Component Value Date   CHOL 233 (H) 04/15/2023   TRIG 102 04/15/2023   HDL 72 04/15/2023   CHOLHDL 3.2 04/15/2023   VLDL 20 04/15/2023   LDLCALC 141 (H) 04/15/2023   Lab Results  Component Value Date   TSH 0.586 03/03/2023   TSH 1.41 01/12/2021    Therapeutic Level Labs: No results found for: LITHIUM No results found for: VALPROATE No results found for: CBMZ  Current Medications: Current Outpatient Medications  Medication Sig Dispense Refill   OLANZapine  (ZYPREXA ) 7.5 MG tablet Take 1 tablet (7.5 mg total) by mouth at bedtime. 30 tablet 3   acetaminophen  (TYLENOL ) 500 MG tablet Take 2 tablets (1,000 mg total) by mouth every 6 (six) hours as needed. 120 tablet 1   atorvastatin (LIPITOR) 10 MG tablet Take 10 mg by mouth daily.     Calcium Carb-Cholecalciferol (CALCIUM + D3 PO) Take 1 tablet by mouth at bedtime. 800 mg calcium/  600 international units d3     cloNIDine  (CATAPRES ) 0.1 MG tablet Take 1 tablet (0.1 mg total) by mouth 2 (two) times daily as needed. 90 tablet 2   dicyclomine  (BENTYL ) 20 MG tablet Take 1 tablet (20 mg total) by mouth 2 (two) times daily. 60 tablet 11   estradiol -levonorgestrel  (CLIMARA  PRO) 0.045-0.015 MG/DAY Place 1 patch onto the skin once a week. 4 patch 12   famotidine  (PEPCID ) 20 MG tablet Take 20 mg by mouth daily as needed for heartburn or indigestion.     FLUoxetine  (PROZAC ) 20 MG capsule Take 1 capsule (20 mg total) by mouth daily. 30 capsule 3   gabapentin  (NEURONTIN ) 300 MG capsule Take 1 capsule (300 mg total) by mouth 3 (three) times daily. 90 capsule 3   hydrOXYzine  (ATARAX ) 25 MG tablet Take 1 tablet (25 mg total) by mouth 3 (three) times daily as needed for anxiety. 90 tablet 3   ibuprofen  (ADVIL ) 600 MG tablet Take 1 tablet (600 mg total) by mouth every 8 (eight) hours as needed (pain). 60 tablet 0    Multiple Vitamins-Minerals (MULTIVITAMIN WITH MINERALS) tablet Take 1 tablet by mouth daily.  nicotine  (NICODERM CQ  - DOSED IN MG/24 HOURS) 21 mg/24hr patch Place 1 patch (21 mg total) onto the skin daily. 28 patch 0   ondansetron  (ZOFRAN -ODT) 8 MG disintegrating tablet Take 1 tablet (8 mg total) by mouth every 8 (eight) hours as needed for nausea, vomiting or refractory nausea / vomiting. 30 tablet 0   oxyCODONE  (OXY IR/ROXICODONE ) 5 MG immediate release tablet Take 1 tablet (5 mg total) by mouth every 6 (six) hours as needed for breakthrough pain. (Patient not taking: Reported on 10/16/2024) 15 tablet 0   polyethylene glycol powder (GLYCOLAX /MIRALAX ) 17 GM/SCOOP powder Dissolve 1 capful (17g) in 4-8 ounces of liquid and take by mouth daily as needed for mild constipation. 238 g 1   polyethylene glycol powder (MIRALAX ) 17 GM/SCOOP powder Take 17 g by mouth daily. Dissolve 1 capful (17g) in 4-8 ounces of liquid and take by mouth daily. 952 g 0   Probiotic Product (PROBIOTIC DAILY) CAPS Take 1 capsule by mouth daily.     simethicone  (MYLICON) 80 MG chewable tablet Chew by mouth every 6 (six) hours as needed. 60 tablet 0   simethicone  (MYLICON) 80 MG chewable tablet Chew 1 tablet (80 mg total) by mouth 4 (four) times daily as needed for flatulence. 30 tablet 0   traZODone  (DESYREL ) 100 MG tablet Take 1 tablet (100 mg total) by mouth at bedtime as needed for sleep. 30 tablet 3   No current facility-administered medications for this visit.     Musculoskeletal: Strength & Muscle Tone: within normal limits and telehealth visit Gait & Station: normal, telehealth visit Patient leans: N/A  Psychiatric Specialty Exam: Review of Systems  There were no vitals taken for this visit.There is no height or weight on file to calculate BMI.  General Appearance: Well Groomed  Eye Contact:  Good  Speech:  Clear and Coherent and Normal Rate  Volume:  Normal  Mood:  Euthymic  Affect:  Appropriate and  Congruent  Thought Process:  Coherent, Goal Directed, and Linear  Orientation:  Full (Time, Place, and Person)  Thought Content: WDL and Logical   Suicidal Thoughts:  No  Homicidal Thoughts:  No  Memory:  Immediate;   Good Recent;   Good Remote;   Good  Judgement:  Good  Insight:  Good  Psychomotor Activity:  Normal  Concentration:  Concentration: Good and Attention Span: Good  Recall:  Good  Fund of Knowledge: Good  Language: Good  Akathisia:  No  Handed:  Right  AIMS (if indicated): not done  Assets:  Communication Skills Desire for Improvement Housing Leisure Time Physical Health Social Support  ADL's:  Intact  Cognition: WNL  Sleep:  Good   Screenings: AUDIT    Flowsheet Row [facility]  Alcohol Use Disorder Identification Test Final Score (AUDIT) 0   GAD-7    Flowsheet Row Video Visit from 11/01/2024 in [facility] Video Visit from 08/23/2024 in [facility] Video Visit from 05/08/2024 in [facility] Video Visit from 03/13/2024 in [facility] Office Visit from 02/12/2024 [facility]  Total GAD-7 Score 3 2 3 3 4    PHQ2-9    Flowsheet Row Video Visit from 11/01/2024 in [facility] Video Visit from 08/23/2024 in [facility] Video Visit from 05/08/2024 in [facility] Video Visit  from 03/13/2024 in [facility] Office Visit from 02/12/2024 in [facility]  PHQ-2 Total Score 1 2 1 2 2   PHQ-9 Total Score 4 3 9 4 5    Flowsheet Row UC from 08/12/2024 in [facility] UC from 08/06/2024 in [facility] at [facility] ED from 07/29/2024 in [facility]  Emergency Department at [facility]  C-SSRS RISK CATEGORY No Risk No Risk No Risk     Assessment and Plan: Patient reports that she is doing well on her current medication regimen.  No medication changes made today.  Patient agreeable to continue medications as prescribed.   1. Anxiety state  Continue- gabapentin  (NEURONTIN ) 300 MG capsule; Take 1 capsule (300 mg total) by mouth 3 (three) times daily.  Dispense: 90 capsule; Refill: 3 Continue- hydrOXYzine  (ATARAX ) 25 MG tablet; Take 1 tablet (25 mg total) by mouth 3 (three) times daily as needed for anxiety.  Dispense: 90 tablet; Refill: 3 Continue- OLANZapine  (ZYPREXA ) 7.5 MG tablet; Take 1 tablet (7.5 mg total) by mouth at bedtime.  Dispense: 30 tablet; Refill: 3  2. Borderline personality disorder (HCC)  Continue- traZODone  (DESYREL ) 100 MG tablet; Take 1 tablet (100 mg total) by mouth at bedtime as needed for sleep.  Dispense: 30 tablet; Refill: 3 Continue- FLUoxetine  (PROZAC ) 20 MG capsule; Take 1 capsule (20 mg total) by mouth daily.  Dispense: 30 capsule; Refill: 3 Continue- OLANZapine  (ZYPREXA ) 7.5 MG tablet; Take 1 tablet (7.5 mg total) by mouth at bedtime.  Dispense: 30 tablet; Refill: 3   Consent: Patient/Guardian gives verbal consent for treatment and assignment of benefits for services provided during this visit. Patient/Guardian expressed understanding and agreed to proceed.   Follow-up in 2 months Zane FORBES Bach, NP 11/01/2024, 11:02 AM

## 2024-11-06 ENCOUNTER — Telehealth (HOSPITAL_COMMUNITY): Payer: Self-pay

## 2024-11-06 NOTE — Telephone Encounter (Signed)
 Prior Authorization was approved for Olanzapine  7.5 mg from 11/06/2024-11/06/2025.

## 2024-11-22 ENCOUNTER — Other Ambulatory Visit: Payer: Self-pay

## 2024-11-22 ENCOUNTER — Ambulatory Visit: Payer: MEDICAID | Admitting: Obstetrics and Gynecology

## 2024-11-22 ENCOUNTER — Encounter: Payer: Self-pay | Admitting: Obstetrics and Gynecology

## 2024-11-22 VITALS — BP 139/88 | HR 67 | Wt 173.6 lb

## 2024-11-22 DIAGNOSIS — R110 Nausea: Secondary | ICD-10-CM

## 2024-11-22 DIAGNOSIS — Z09 Encounter for follow-up examination after completed treatment for conditions other than malignant neoplasm: Secondary | ICD-10-CM

## 2024-11-22 DIAGNOSIS — N809 Endometriosis, unspecified: Secondary | ICD-10-CM

## 2024-11-22 DIAGNOSIS — E8940 Asymptomatic postprocedural ovarian failure: Secondary | ICD-10-CM

## 2024-11-22 MED ORDER — ONDANSETRON 8 MG PO TBDP: 8.0000 mg | ORAL_TABLET | Freq: Three times a day (TID) | ORAL | 0 refills | Status: AC | PRN

## 2024-11-22 NOTE — Progress Notes (Signed)
" ° °  POSTOPERATIVE VISIT NOTE   Subjective:     Gina Dorsey is a 33 y.o. [id number] who presents to the clinic 8 weeks status post total laparoscopic hysterectomy and bilateral oophorectomy for abnormal uterine bleeding and pelvic pain. Eating a regular diet without difficulty. Bowel movements are normal. Pain is controlled without any medications. Incision: healing well Vaginal bleeding: no Resumed sexual acitivity: no  Having some cramps when she wakes up in the morning, includign this AM but overall feels healign well. Still having some bloating, nausea primarily in the morning when she wakes up. No burning associated with the pain at this time. Occasional hot flashes but well controlled with patch. Mood seems even keel - a little better since surgery. Has quite cigarette use. No issues with the patch.   The following portions of the patient's history were reviewed and updated as appropriate: allergies, current medications, past family history, past medical history, past social history, past surgical history, and problem list..   Review of Systems Pertinent items are noted in HPI.    Objective:    BP 139/88   Pulse 67   Wt 173 lb 9.6 oz (78.7 kg)   BMI 29.80 kg/m  General:  alert, cooperative, and no distress  Abdomen: soft, non-tender  Incision:   healing well, no drainage, no erythema, no hernia, no seroma, no swelling, no dehiscence, incision well approximated  Pelvic:       Pathology Results: FINAL MICROSCOPIC DIAGNOSIS:   A. UTERUS AND CERVIX, WITH BILATERAL FALLOPIAN TUBES AND OVARIES,  HYSTERECTOMY:  - Cervix: Benign, nabothian cysts  - Endometrium: Benign, no hyperplasia or malignancy identified  - Myometrium: Benign, focal areas of hemorrhage  - Right ovary: Benign follicular cyst  - Bilateral fallopian tubes: Benign, paratubal cysts    Assessment:   Doing well postoperatively. Operative findings again reviewed. Pathology report discussed.   Plan:    1. Postop  check (Primary) Doing well. Meeting postop goals  2. Nausea Refills sent for now.  - ondansetron  (ZOFRAN -ODT) 8 MG disintegrating tablet; Take 1 tablet (8 mg total) by mouth every 8 (eight) hours as needed for nausea, vomiting or refractory nausea / vomiting.  Dispense: 30 tablet; Refill: 0  3. Endometriosis 4. Surgical menopause on hormone replacement therapy Now s/p TLH, BSO and doing well with HRT. Discussed continued use until at least 35 yo and can increase dose to control symptoms given surgical menopause and age. Will follow up in 3-6 months and then yearly thereafter if well controlled. Slow return to all activities   Activity restrictions: none Follow up: 3-6 months  Carter Quarry, MD Obstetrician & Gynecologist, [facility] for [employer], [facility] "

## 2025-01-08 ENCOUNTER — Telehealth (HOSPITAL_COMMUNITY): Payer: MEDICAID | Admitting: Psychiatry
